# Patient Record
Sex: Male | Born: 1955 | Race: White | Hispanic: No | Marital: Married | Smoking: Never smoker
Health system: Southern US, Community
[De-identification: ages and names within clinical notes are randomized; demographics above are authoritative.]

## PROBLEM LIST (undated history)

## (undated) DIAGNOSIS — M1990 Unspecified osteoarthritis, unspecified site: Secondary | ICD-10-CM

## (undated) DIAGNOSIS — I10 Essential (primary) hypertension: Secondary | ICD-10-CM

## (undated) DIAGNOSIS — IMO0001 Reserved for inherently not codable concepts without codable children: Secondary | ICD-10-CM

## (undated) DIAGNOSIS — F329 Major depressive disorder, single episode, unspecified: Secondary | ICD-10-CM

## (undated) DIAGNOSIS — G250 Essential tremor: Secondary | ICD-10-CM

## (undated) DIAGNOSIS — I509 Heart failure, unspecified: Secondary | ICD-10-CM

## (undated) DIAGNOSIS — M545 Low back pain, unspecified: Secondary | ICD-10-CM

## (undated) DIAGNOSIS — I2510 Atherosclerotic heart disease of native coronary artery without angina pectoris: Secondary | ICD-10-CM

## (undated) DIAGNOSIS — I209 Angina pectoris, unspecified: Secondary | ICD-10-CM

## (undated) DIAGNOSIS — I25810 Atherosclerosis of coronary artery bypass graft(s) without angina pectoris: Secondary | ICD-10-CM

## (undated) DIAGNOSIS — G4733 Obstructive sleep apnea (adult) (pediatric): Secondary | ICD-10-CM

## (undated) DIAGNOSIS — G8929 Other chronic pain: Secondary | ICD-10-CM

## (undated) DIAGNOSIS — E785 Hyperlipidemia, unspecified: Secondary | ICD-10-CM

## (undated) DIAGNOSIS — E669 Obesity, unspecified: Secondary | ICD-10-CM

## (undated) DIAGNOSIS — Z9289 Personal history of other medical treatment: Secondary | ICD-10-CM

## (undated) DIAGNOSIS — Z9989 Dependence on other enabling machines and devices: Secondary | ICD-10-CM

## (undated) DIAGNOSIS — I219 Acute myocardial infarction, unspecified: Secondary | ICD-10-CM

## (undated) DIAGNOSIS — R001 Bradycardia, unspecified: Secondary | ICD-10-CM

## (undated) DIAGNOSIS — Z87442 Personal history of urinary calculi: Secondary | ICD-10-CM

## (undated) DIAGNOSIS — F32A Depression, unspecified: Secondary | ICD-10-CM

## (undated) DIAGNOSIS — N189 Chronic kidney disease, unspecified: Secondary | ICD-10-CM

## (undated) HISTORY — PX: BACK SURGERY: SHX140

## (undated) HISTORY — DX: Obesity, unspecified: E66.9

## (undated) HISTORY — PX: POSTERIOR CERVICAL FUSION/FORAMINOTOMY: SHX5038

## (undated) HISTORY — PX: CYSTOSCOPY W/ URETEROSCOPY W/ LITHOTRIPSY: SUR380

## (undated) HISTORY — PX: ANTERIOR CERVICAL DISCECTOMY: SHX1160

## (undated) HISTORY — PX: LUMBAR DISC SURGERY: SHX700

## (undated) HISTORY — PX: CARPAL TUNNEL RELEASE: SHX101

## (undated) HISTORY — PX: LITHOTRIPSY: SUR834

## (undated) HISTORY — PX: SHOULDER ARTHROSCOPY: SHX128

---

## 1998-03-05 ENCOUNTER — Ambulatory Visit (HOSPITAL_BASED_OUTPATIENT_CLINIC_OR_DEPARTMENT_OTHER): Admission: RE | Admit: 1998-03-05 | Discharge: 1998-03-05 | Payer: Self-pay | Admitting: Orthopedic Surgery

## 1999-09-22 ENCOUNTER — Encounter: Payer: Self-pay | Admitting: Orthopedic Surgery

## 1999-09-22 ENCOUNTER — Ambulatory Visit (HOSPITAL_COMMUNITY): Admission: RE | Admit: 1999-09-22 | Discharge: 1999-09-22 | Payer: Self-pay | Admitting: Orthopedic Surgery

## 1999-10-27 ENCOUNTER — Ambulatory Visit (HOSPITAL_COMMUNITY): Admission: RE | Admit: 1999-10-27 | Discharge: 1999-10-27 | Payer: Self-pay | Admitting: Neurosurgery

## 1999-10-27 ENCOUNTER — Encounter: Payer: Self-pay | Admitting: Neurosurgery

## 1999-11-10 ENCOUNTER — Encounter: Payer: Self-pay | Admitting: Neurosurgery

## 1999-11-10 ENCOUNTER — Ambulatory Visit (HOSPITAL_COMMUNITY): Admission: RE | Admit: 1999-11-10 | Discharge: 1999-11-10 | Payer: Self-pay | Admitting: Neurosurgery

## 1999-11-24 ENCOUNTER — Encounter: Payer: Self-pay | Admitting: Neurosurgery

## 1999-11-24 ENCOUNTER — Ambulatory Visit (HOSPITAL_COMMUNITY): Admission: RE | Admit: 1999-11-24 | Discharge: 1999-11-24 | Payer: Self-pay | Admitting: Neurosurgery

## 2000-02-18 ENCOUNTER — Encounter: Payer: Self-pay | Admitting: Neurosurgery

## 2000-02-23 ENCOUNTER — Inpatient Hospital Stay (HOSPITAL_COMMUNITY): Admission: RE | Admit: 2000-02-23 | Discharge: 2000-02-25 | Payer: Self-pay | Admitting: Neurosurgery

## 2000-02-23 ENCOUNTER — Encounter: Payer: Self-pay | Admitting: Neurosurgery

## 2000-03-17 ENCOUNTER — Ambulatory Visit (HOSPITAL_COMMUNITY): Admission: RE | Admit: 2000-03-17 | Discharge: 2000-03-17 | Payer: Self-pay | Admitting: Neurosurgery

## 2000-03-17 ENCOUNTER — Encounter: Payer: Self-pay | Admitting: Neurosurgery

## 2000-04-07 ENCOUNTER — Inpatient Hospital Stay (HOSPITAL_COMMUNITY): Admission: EM | Admit: 2000-04-07 | Discharge: 2000-04-11 | Payer: Self-pay | Admitting: *Deleted

## 2000-04-28 HISTORY — PX: NM MYOCAR SINGLE W/SPECT: HXRAD625

## 2000-05-12 ENCOUNTER — Encounter: Payer: Self-pay | Admitting: Emergency Medicine

## 2000-05-12 ENCOUNTER — Inpatient Hospital Stay (HOSPITAL_COMMUNITY): Admission: EM | Admit: 2000-05-12 | Discharge: 2000-05-13 | Payer: Self-pay | Admitting: Emergency Medicine

## 2000-08-16 ENCOUNTER — Inpatient Hospital Stay (HOSPITAL_COMMUNITY): Admission: EM | Admit: 2000-08-16 | Discharge: 2000-08-18 | Payer: Self-pay | Admitting: Emergency Medicine

## 2000-08-16 ENCOUNTER — Encounter: Payer: Self-pay | Admitting: Cardiovascular Disease

## 2000-08-18 ENCOUNTER — Encounter: Payer: Self-pay | Admitting: Urology

## 2000-08-25 ENCOUNTER — Ambulatory Visit (HOSPITAL_COMMUNITY): Admission: RE | Admit: 2000-08-25 | Discharge: 2000-08-25 | Payer: Self-pay | Admitting: Urology

## 2000-08-25 ENCOUNTER — Encounter: Payer: Self-pay | Admitting: Urology

## 2000-08-31 ENCOUNTER — Encounter: Payer: Self-pay | Admitting: Urology

## 2000-08-31 ENCOUNTER — Encounter: Admission: RE | Admit: 2000-08-31 | Discharge: 2000-08-31 | Payer: Self-pay | Admitting: Urology

## 2000-09-06 ENCOUNTER — Encounter: Payer: Self-pay | Admitting: Urology

## 2000-09-06 ENCOUNTER — Ambulatory Visit (HOSPITAL_COMMUNITY): Admission: RE | Admit: 2000-09-06 | Discharge: 2000-09-06 | Payer: Self-pay | Admitting: Urology

## 2000-09-15 ENCOUNTER — Encounter: Payer: Self-pay | Admitting: *Deleted

## 2000-09-15 ENCOUNTER — Ambulatory Visit (HOSPITAL_COMMUNITY): Admission: RE | Admit: 2000-09-15 | Discharge: 2000-09-15 | Payer: Self-pay | Admitting: Urology

## 2000-09-27 ENCOUNTER — Encounter: Payer: Self-pay | Admitting: Urology

## 2000-09-27 ENCOUNTER — Encounter: Admission: RE | Admit: 2000-09-27 | Discharge: 2000-09-27 | Payer: Self-pay | Admitting: Urology

## 2000-10-11 DIAGNOSIS — I2510 Atherosclerotic heart disease of native coronary artery without angina pectoris: Secondary | ICD-10-CM

## 2000-10-11 HISTORY — DX: Atherosclerotic heart disease of native coronary artery without angina pectoris: I25.10

## 2000-10-31 ENCOUNTER — Encounter: Admission: RE | Admit: 2000-10-31 | Discharge: 2000-10-31 | Payer: Self-pay | Admitting: Urology

## 2000-10-31 ENCOUNTER — Encounter: Payer: Self-pay | Admitting: Urology

## 2000-11-03 ENCOUNTER — Encounter: Payer: Self-pay | Admitting: Urology

## 2000-11-03 ENCOUNTER — Encounter: Admission: RE | Admit: 2000-11-03 | Discharge: 2000-11-03 | Payer: Self-pay | Admitting: Urology

## 2000-11-12 ENCOUNTER — Encounter: Payer: Self-pay | Admitting: Neurosurgery

## 2000-11-12 ENCOUNTER — Ambulatory Visit (HOSPITAL_COMMUNITY): Admission: RE | Admit: 2000-11-12 | Discharge: 2000-11-12 | Payer: Self-pay | Admitting: Neurosurgery

## 2000-11-24 ENCOUNTER — Ambulatory Visit (HOSPITAL_COMMUNITY): Admission: RE | Admit: 2000-11-24 | Discharge: 2000-11-24 | Payer: Self-pay | Admitting: Urology

## 2000-11-24 ENCOUNTER — Encounter: Payer: Self-pay | Admitting: Urology

## 2000-12-05 ENCOUNTER — Ambulatory Visit (HOSPITAL_COMMUNITY): Admission: RE | Admit: 2000-12-05 | Discharge: 2000-12-05 | Payer: Self-pay | Admitting: Neurosurgery

## 2000-12-05 ENCOUNTER — Encounter: Payer: Self-pay | Admitting: Neurosurgery

## 2000-12-07 ENCOUNTER — Encounter: Payer: Self-pay | Admitting: Urology

## 2000-12-07 ENCOUNTER — Encounter: Admission: RE | Admit: 2000-12-07 | Discharge: 2000-12-07 | Payer: Self-pay | Admitting: Urology

## 2000-12-19 ENCOUNTER — Ambulatory Visit (HOSPITAL_COMMUNITY): Admission: RE | Admit: 2000-12-19 | Discharge: 2000-12-19 | Payer: Self-pay | Admitting: Neurosurgery

## 2000-12-19 ENCOUNTER — Encounter: Payer: Self-pay | Admitting: Neurosurgery

## 2000-12-21 ENCOUNTER — Encounter: Admission: RE | Admit: 2000-12-21 | Discharge: 2000-12-21 | Payer: Self-pay | Admitting: Urology

## 2000-12-21 ENCOUNTER — Encounter: Payer: Self-pay | Admitting: Urology

## 2001-01-05 ENCOUNTER — Encounter: Payer: Self-pay | Admitting: Urology

## 2001-01-05 ENCOUNTER — Encounter: Admission: RE | Admit: 2001-01-05 | Discharge: 2001-01-05 | Payer: Self-pay | Admitting: Urology

## 2001-01-24 ENCOUNTER — Encounter: Admission: RE | Admit: 2001-01-24 | Discharge: 2001-01-24 | Payer: Self-pay | Admitting: Neurosurgery

## 2001-01-24 ENCOUNTER — Encounter: Payer: Self-pay | Admitting: Neurosurgery

## 2001-04-10 DIAGNOSIS — Z9289 Personal history of other medical treatment: Secondary | ICD-10-CM

## 2001-04-10 HISTORY — DX: Personal history of other medical treatment: Z92.89

## 2001-04-18 ENCOUNTER — Inpatient Hospital Stay (HOSPITAL_COMMUNITY): Admission: RE | Admit: 2001-04-18 | Discharge: 2001-04-27 | Payer: Self-pay | Admitting: *Deleted

## 2001-04-19 ENCOUNTER — Encounter: Payer: Self-pay | Admitting: Cardiothoracic Surgery

## 2001-04-20 ENCOUNTER — Encounter: Payer: Self-pay | Admitting: Cardiothoracic Surgery

## 2001-04-20 HISTORY — PX: CORONARY ARTERY BYPASS GRAFT: SHX141

## 2001-04-21 ENCOUNTER — Encounter: Payer: Self-pay | Admitting: Cardiothoracic Surgery

## 2001-04-22 ENCOUNTER — Encounter: Payer: Self-pay | Admitting: Cardiothoracic Surgery

## 2001-05-26 ENCOUNTER — Encounter: Admission: RE | Admit: 2001-05-26 | Discharge: 2001-05-26 | Payer: Self-pay | Admitting: Cardiothoracic Surgery

## 2001-05-26 ENCOUNTER — Encounter: Payer: Self-pay | Admitting: Cardiothoracic Surgery

## 2001-09-10 HISTORY — PX: INSERT / REPLACE / REMOVE PACEMAKER: SUR710

## 2001-10-11 DIAGNOSIS — Z950 Presence of cardiac pacemaker: Secondary | ICD-10-CM

## 2001-10-11 HISTORY — DX: Presence of cardiac pacemaker: Z95.0

## 2002-05-10 HISTORY — PX: DOPPLER ECHOCARDIOGRAPHY: SHX263

## 2002-12-10 ENCOUNTER — Encounter: Admission: RE | Admit: 2002-12-10 | Discharge: 2002-12-10 | Payer: Self-pay | Admitting: Neurosurgery

## 2002-12-10 ENCOUNTER — Encounter: Payer: Self-pay | Admitting: Neurosurgery

## 2002-12-24 ENCOUNTER — Encounter: Payer: Self-pay | Admitting: Neurosurgery

## 2002-12-24 ENCOUNTER — Encounter: Admission: RE | Admit: 2002-12-24 | Discharge: 2002-12-24 | Payer: Self-pay | Admitting: Neurosurgery

## 2003-01-07 ENCOUNTER — Encounter: Payer: Self-pay | Admitting: Neurosurgery

## 2003-01-07 ENCOUNTER — Encounter: Admission: RE | Admit: 2003-01-07 | Discharge: 2003-01-07 | Payer: Self-pay | Admitting: Neurosurgery

## 2003-03-22 ENCOUNTER — Encounter: Payer: Self-pay | Admitting: Neurosurgery

## 2003-03-26 ENCOUNTER — Inpatient Hospital Stay (HOSPITAL_COMMUNITY): Admission: RE | Admit: 2003-03-26 | Discharge: 2003-03-28 | Payer: Self-pay | Admitting: Neurosurgery

## 2003-03-26 ENCOUNTER — Encounter: Payer: Self-pay | Admitting: Neurosurgery

## 2003-07-09 ENCOUNTER — Encounter: Payer: Self-pay | Admitting: Neurosurgery

## 2003-07-09 ENCOUNTER — Ambulatory Visit (HOSPITAL_COMMUNITY): Admission: RE | Admit: 2003-07-09 | Discharge: 2003-07-09 | Payer: Self-pay | Admitting: Neurosurgery

## 2003-10-29 ENCOUNTER — Inpatient Hospital Stay (HOSPITAL_COMMUNITY): Admission: RE | Admit: 2003-10-29 | Discharge: 2003-10-31 | Payer: Self-pay | Admitting: Neurosurgery

## 2004-02-14 ENCOUNTER — Ambulatory Visit (HOSPITAL_COMMUNITY): Admission: RE | Admit: 2004-02-14 | Discharge: 2004-02-14 | Payer: Self-pay | Admitting: Neurosurgery

## 2004-10-11 DIAGNOSIS — R001 Bradycardia, unspecified: Secondary | ICD-10-CM

## 2004-10-11 HISTORY — DX: Bradycardia, unspecified: R00.1

## 2004-12-09 HISTORY — PX: CORONARY ANGIOPLASTY WITH STENT PLACEMENT: SHX49

## 2004-12-10 ENCOUNTER — Observation Stay (HOSPITAL_COMMUNITY): Admission: EM | Admit: 2004-12-10 | Discharge: 2004-12-11 | Payer: Self-pay | Admitting: Emergency Medicine

## 2005-09-13 ENCOUNTER — Inpatient Hospital Stay (HOSPITAL_COMMUNITY): Admission: AD | Admit: 2005-09-13 | Discharge: 2005-09-16 | Payer: Self-pay | Admitting: *Deleted

## 2005-09-13 ENCOUNTER — Ambulatory Visit: Payer: Self-pay | Admitting: Internal Medicine

## 2005-09-27 ENCOUNTER — Emergency Department (HOSPITAL_COMMUNITY): Admission: EM | Admit: 2005-09-27 | Discharge: 2005-09-27 | Payer: Self-pay | Admitting: Emergency Medicine

## 2005-10-11 HISTORY — PX: MAXIMUM ACCESS (MAS)POSTERIOR LUMBAR INTERBODY FUSION (PLIF) 3 LEVEL: SHX6370

## 2005-10-15 ENCOUNTER — Ambulatory Visit: Payer: Self-pay | Admitting: Internal Medicine

## 2006-03-02 ENCOUNTER — Ambulatory Visit (HOSPITAL_COMMUNITY): Admission: RE | Admit: 2006-03-02 | Discharge: 2006-03-02 | Payer: Self-pay | Admitting: Gastroenterology

## 2006-10-11 HISTORY — PX: CERVICAL DISC SURGERY: SHX588

## 2007-05-18 ENCOUNTER — Encounter: Admission: RE | Admit: 2007-05-18 | Discharge: 2007-05-18 | Payer: Self-pay | Admitting: *Deleted

## 2007-05-24 ENCOUNTER — Ambulatory Visit (HOSPITAL_COMMUNITY): Admission: RE | Admit: 2007-05-24 | Discharge: 2007-05-24 | Payer: Self-pay | Admitting: *Deleted

## 2007-05-24 HISTORY — PX: CARDIAC CATHETERIZATION: SHX172

## 2007-07-21 ENCOUNTER — Encounter: Admission: RE | Admit: 2007-07-21 | Discharge: 2007-07-21 | Payer: Self-pay | Admitting: Neurosurgery

## 2007-10-13 ENCOUNTER — Encounter: Admission: RE | Admit: 2007-10-13 | Discharge: 2007-10-13 | Payer: Self-pay | Admitting: Neurosurgery

## 2007-11-17 ENCOUNTER — Inpatient Hospital Stay (HOSPITAL_COMMUNITY): Admission: RE | Admit: 2007-11-17 | Discharge: 2007-11-22 | Payer: Self-pay | Admitting: Neurosurgery

## 2008-09-17 ENCOUNTER — Emergency Department (HOSPITAL_COMMUNITY): Admission: EM | Admit: 2008-09-17 | Discharge: 2008-09-17 | Payer: Self-pay | Admitting: Emergency Medicine

## 2008-11-21 ENCOUNTER — Encounter: Admission: RE | Admit: 2008-11-21 | Discharge: 2008-11-21 | Payer: Self-pay | Admitting: Neurosurgery

## 2009-01-31 ENCOUNTER — Inpatient Hospital Stay (HOSPITAL_COMMUNITY): Admission: RE | Admit: 2009-01-31 | Discharge: 2009-02-01 | Payer: Self-pay | Admitting: Neurosurgery

## 2009-02-08 HISTORY — PX: CARDIAC CATHETERIZATION: SHX172

## 2009-03-04 ENCOUNTER — Inpatient Hospital Stay (HOSPITAL_COMMUNITY): Admission: EM | Admit: 2009-03-04 | Discharge: 2009-03-07 | Payer: Self-pay | Admitting: Emergency Medicine

## 2010-05-25 HISTORY — PX: DOPPLER ECHOCARDIOGRAPHY: SHX263

## 2010-08-20 ENCOUNTER — Encounter: Admission: RE | Admit: 2010-08-20 | Discharge: 2010-08-20 | Payer: Self-pay | Admitting: Neurosurgery

## 2010-08-31 ENCOUNTER — Encounter: Payer: Self-pay | Admitting: Family Medicine

## 2010-09-01 ENCOUNTER — Ambulatory Visit (HOSPITAL_COMMUNITY)
Admission: RE | Admit: 2010-09-01 | Discharge: 2010-09-03 | Disposition: A | Discharge status: Still In Hospital | Payer: Self-pay | Source: Home / Self Care | Admitting: Cardiology

## 2010-09-01 HISTORY — PX: CORONARY ANGIOPLASTY WITH STENT PLACEMENT: SHX49

## 2010-11-12 NOTE — Letter (Signed)
Summary: [facility]  [facility]   Imported By: Laural Benes 09/24/2010 14:17:22  _____________________________________________________________________  External Attachment:    Type:   Image     Comment:   External Document

## 2010-12-23 LAB — BASIC METABOLIC PANEL
BUN: 10 mg/dL (ref 6–23)
BUN: 10 mg/dL (ref 6–23)
BUN: 10 mg/dL (ref 6–23)
CO2: 28 mEq/L (ref 19–32)
Calcium: 8.6 mg/dL (ref 8.4–10.5)
Calcium: 9 mg/dL (ref 8.4–10.5)
Chloride: 102 mEq/L (ref 96–112)
Chloride: 104 mEq/L (ref 96–112)
Creatinine, Ser: 0.94 mg/dL (ref 0.4–1.5)
Creatinine, Ser: 1.08 mg/dL (ref 0.4–1.5)
Creatinine, Ser: 1.13 mg/dL (ref 0.4–1.5)
GFR calc Af Amer: >60 mL/min (ref >60)
GFR calc Af Amer: >60 mL/min (ref >60)
GFR calc non Af Amer: >60 mL/min (ref >60)
Glucose, Bld: 107 mg/dL — ABNORMAL HIGH (ref 70–99)
Glucose, Bld: 112 mg/dL — ABNORMAL HIGH (ref 70–99)

## 2010-12-23 LAB — CBC
HCT: 40.9 % (ref 39.0–52.0)
MCH: 28.1 pg (ref 26.0–34.0)
MCHC: 33.7 g/dL (ref 30.0–36.0)
MCHC: 34.2 g/dL (ref 30.0–36.0)
MCHC: 35.1 g/dL (ref 30.0–36.0)
MCV: 81.8 fL (ref 78.0–100.0)
MCV: 83.4 fL (ref 78.0–100.0)
Platelets: 228 10*3/uL (ref 150–400)
Platelets: 270 10*3/uL (ref 150–400)
Platelets: 271 10*3/uL (ref 150–400)
RBC: 4.52 MIL/uL (ref 4.22–5.81)
RDW: 13.5 % (ref 11.5–15.5)
RDW: 13.7 % (ref 11.5–15.5)
RDW: 13.8 % (ref 11.5–15.5)
WBC: 6.6 10*3/uL (ref 4.0–10.5)
WBC: 8 10*3/uL (ref 4.0–10.5)

## 2011-01-19 LAB — BASIC METABOLIC PANEL
BUN: 12 mg/dL (ref 6–23)
Calcium: 8.9 mg/dL (ref 8.4–10.5)
Calcium: 9.2 mg/dL (ref 8.4–10.5)
Creatinine, Ser: 1.01 mg/dL (ref 0.4–1.5)
GFR calc Af Amer: >60 mL/min (ref >60)
GFR calc non Af Amer: >60 mL/min (ref >60)
Glucose, Bld: 108 mg/dL — ABNORMAL HIGH (ref 70–99)
Sodium: 139 mEq/L (ref 135–145)

## 2011-01-19 LAB — CBC
HCT: 40.2 % (ref 39.0–52.0)
HCT: 44.6 % (ref 39.0–52.0)
Hemoglobin: 15.3 g/dL (ref 13.0–17.0)
MCHC: 34.4 g/dL (ref 30.0–36.0)
Platelets: 233 10*3/uL (ref 150–400)
Platelets: 258 10*3/uL (ref 150–400)
RBC: 5.1 MIL/uL (ref 4.22–5.81)
RDW: 14.4 % (ref 11.5–15.5)
RDW: 15.1 % (ref 11.5–15.5)
WBC: 8.7 10*3/uL (ref 4.0–10.5)

## 2011-01-19 LAB — COMPREHENSIVE METABOLIC PANEL
Alkaline Phosphatase: 78 U/L (ref 39–117)
BUN: 10 mg/dL (ref 6–23)
Chloride: 105 mEq/L (ref 96–112)
Creatinine, Ser: 1.02 mg/dL (ref 0.4–1.5)
GFR calc non Af Amer: >60 mL/min (ref >60)
Glucose, Bld: 106 mg/dL — ABNORMAL HIGH (ref 70–99)
Potassium: 3.9 mEq/L (ref 3.5–5.1)
Total Bilirubin: 1.5 mg/dL — ABNORMAL HIGH (ref 0.3–1.2)

## 2011-01-19 LAB — POCT I-STAT, CHEM 8
Calcium, Ion: 1.11 mmol/L — ABNORMAL LOW (ref 1.12–1.32)
Chloride: 104 mEq/L (ref 96–112)
Glucose, Bld: 136 mg/dL — ABNORMAL HIGH (ref 70–99)
HCT: 45 % (ref 39.0–52.0)
Hemoglobin: 15.3 g/dL (ref 13.0–17.0)
Potassium: 3.7 mEq/L (ref 3.5–5.1)

## 2011-01-19 LAB — LIPID PANEL
HDL: 24 mg/dL — ABNORMAL LOW (ref >39)
Triglycerides: 388 mg/dL — ABNORMAL HIGH (ref <150)
VLDL: 78 mg/dL — ABNORMAL HIGH (ref 0–40)

## 2011-01-19 LAB — DIFFERENTIAL
Basophils Absolute: 0 10*3/uL (ref 0.0–0.1)
Basophils Relative: 0 % (ref 0–1)
Lymphocytes Relative: 22 % (ref 12–46)
Neutro Abs: 4.8 10*3/uL (ref 1.7–7.7)
Neutrophils Relative %: 61 % (ref 43–77)

## 2011-01-19 LAB — PLATELET COUNT: Platelets: 243 10*3/uL (ref 150–400)

## 2011-01-19 LAB — HEMOGLOBIN A1C
Hgb A1c MFr Bld: 5.7 % (ref 4.6–6.1)
Mean Plasma Glucose: 117 mg/dL

## 2011-01-19 LAB — CARDIAC PANEL(CRET KIN+CKTOT+MB+TROPI)
CK, MB: 11.9 ng/mL — ABNORMAL HIGH (ref 0.3–4.0)
CK, MB: 8.3 ng/mL — ABNORMAL HIGH (ref 0.3–4.0)
Relative Index: 4.1 — ABNORMAL HIGH (ref 0.0–2.5)
Total CK: 292 U/L — ABNORMAL HIGH (ref 7–232)

## 2011-01-19 LAB — POCT CARDIAC MARKERS
CKMB, poc: 10.1 ng/mL (ref 1.0–8.0)
Troponin i, poc: 1.41 ng/mL (ref 0.00–0.09)

## 2011-01-19 LAB — TROPONIN I: Troponin I: 1.96 ng/mL (ref 0.00–0.06)

## 2011-01-19 LAB — MAGNESIUM: Magnesium: 2.4 mg/dL (ref 1.5–2.5)

## 2011-01-19 LAB — HEPARIN LEVEL (UNFRACTIONATED): Heparin Unfractionated: 0.29 IU/mL — ABNORMAL LOW (ref 0.30–0.70)

## 2011-01-20 LAB — URINALYSIS, ROUTINE W REFLEX MICROSCOPIC
Glucose, UA: NEGATIVE mg/dL
Hgb urine dipstick: NEGATIVE
Protein, ur: NEGATIVE mg/dL
pH: 5.5 (ref 5.0–8.0)

## 2011-01-20 LAB — CBC
HCT: 47.6 % (ref 39.0–52.0)
Hemoglobin: 15.5 g/dL (ref 13.0–17.0)
MCV: 82 fL (ref 78.0–100.0)
RBC: 5.8 MIL/uL (ref 4.22–5.81)
WBC: 5.7 10*3/uL (ref 4.0–10.5)

## 2011-01-20 LAB — COMPREHENSIVE METABOLIC PANEL
AST: 24 U/L (ref 0–37)
BUN: 10 mg/dL (ref 6–23)
CO2: 25 mEq/L (ref 19–32)
Chloride: 104 mEq/L (ref 96–112)
Creatinine, Ser: 1.01 mg/dL (ref 0.4–1.5)
GFR calc non Af Amer: >60 mL/min (ref >60)
Total Bilirubin: 2 mg/dL — ABNORMAL HIGH (ref 0.3–1.2)

## 2011-01-20 LAB — DIFFERENTIAL
Basophils Absolute: 0 10*3/uL (ref 0.0–0.1)
Eosinophils Absolute: 0.4 10*3/uL (ref 0.0–0.7)
Eosinophils Relative: 7 % — ABNORMAL HIGH (ref 0–5)
Lymphs Abs: 1.4 10*3/uL (ref 0.7–4.0)
Monocytes Absolute: 0.5 10*3/uL (ref 0.1–1.0)

## 2011-01-20 LAB — BILIRUBIN, DIRECT: Bilirubin, Direct: 0.2 mg/dL (ref 0.0–0.3)

## 2011-01-20 LAB — PROTIME-INR: Prothrombin Time: 13 seconds (ref 11.6–15.2)
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
## 2011-02-23 NOTE — Op Note (Signed)
NAME:  [name], [name]            ACCOUNT NO.:  [id number]   MEDICAL RECORD NO.:  [id number]          PATIENT TYPE:  INP   LOCATION:  3310                         FACILITY:  [facility]   PHYSICIAN:  Elizabeth Sauer, M.D.      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   PROCEDURE:  L3-4, L4-5, L5-S1 laminectomy, diskectomy, posterior lumbar  antibody fusion with PEEK cages, segmental pedicle screw fixation from  L3 to S1 and posterolateral arthrodesis L3 to S1.   SURGEON:  Elizabeth Sauer, M.D.   ANESTHESIA:  General endotracheal.   PREPARATION:  Betadine  prep with alcohol wipe.   COMPLICATIONS:  None.   NURSE ASSISTANT:  C[place].   DOCTOR ASSISTANT:  Dr. Vertell Limber.   BODY OF TEXT:  This is a 55 year old gentleman with severe spondylosis,  difficulty with walking.  He has had several prior diskectomies at 3-4,  4-5 and 5-1.  He was taken to the operating room and smoothly  anesthetized and intubated, placed prone on the operating table.  His  frozen shoulder on the right was padded and protected.  Following shave,  prep and drape in the usual sterile fashion the skin was infiltrated  with 1% lidocaine with 1:400,000 epinephrine.  Skin was incised from mid  L2 to mid S1 and the lamina and transverse processes of L3, L4, L5, S1  and the sacral ala were exposed bilaterally in subperiosteal plane.  Intraoperative x-ray confirmed correctness of level.  Having confirmed  correctness of level the pars reticularis lamina and inferior facet of  L3, L4 and L5 and superior facet of L4, L5 and S1 removed bilaterally  using a high speed drill and the Kerrison.  This allowed exposure of  ligamentum flavum that was removed and exposure of the L3, L4, L5 and S1  nerve roots as they traversed this area.   Complete decompression of the  nerve roots was obtained.  4-5 was the most degenerated level with  significant both left and right-sided compression of the 4 root as it  exited, 5 root was also significantly compromised.  Following complete  decompression the PEEK cages were placed 7 mm at L3-4 and L5-S1 and 8 mm  at L4-5.  Pedicle screws were then placed using the standard landmarks  at L3, L4, L5 and S1.  Intraoperative x-rays showed good placement of  the pedicle screws.  The rods were contoured and placed in the screws  and locking caps placed.  Final x-ray showed good placement of cages and  pedicle screws and rods.  The transverse processes and  sacral ala were decorticated and packed with BMP on the extender matrix.  Successive layers of 0 Vicryl, 2-0 Vicryl and 3-0 nylon were used to  close.  Betadine and Telfa dressing was applied and occlusive OpSite.  The patient returned to the recovery room in good condition.  ______________________________  Elizabeth Sauer, M.D.     MWR/MEDQ  D:  11/17/2007  T:  11/18/2007  Job:  [id number]

## 2011-02-23 NOTE — Discharge Summary (Signed)
NAME:  [name], [name]            ACCOUNT NO.:  [id number]   MEDICAL RECORD NO.:  [id number]          PATIENT TYPE:  INP   LOCATION:  3031                         FACILITY:  [facility]   PHYSICIAN:  Elizabeth Sauer, M.D.      DATE OF BIRTH:  Sep 27, 1956   DATE OF ADMISSION:  11/17/2007  DATE OF DISCHARGE:  11/22/2007                               DISCHARGE SUMMARY   ADMISSION DIAGNOSIS:  Spondylosis, L3-4, L4-5, L5-S1.   DISCHARGE DIAGNOSIS:  Spondylosis, L3-4, L4-5, L5-S1.   OPERATIVE PROCEDURE:  L3-4, L4-5, L5-S1 laminectomy, diskectomy,  posterior lumbar antibody fusion, segmental pedicle screw fixation and  posterolateral arthrodesis.   COMPLICATIONS:  None.   DISCHARGE/PLAN:  Alive and well.   BODY OF TEXT:  This is a 55 year old gentleman whose history and  physical is recounted on the chart.  He has had spondylosis and, in  fact, had a decompression at L3-4 numerous years ago.  Diskography was  done and was positive at all three levels.  He is admitted for fusion.   MEDICAL HISTORY:  Remarkable for coronary artery disease and bypass and  implanted defibrillator.   MEDICATIONS:  Vicodin, Flexeril, Celebrex and metoprolol.   ALLERGIES:  None.   SURGICAL HISTORY:  Is as above.  He has also had an anterior cervical  decompression and fusion.   SOCIAL HISTORY:  He no longer smokes, occasionally drinks beer, and is  on disability.   His general exam was intact.  Neurologic exam was intact.   CLINICAL IMPRESSION:  Severe lumbar spondylosis with disabling back  pain.   He was admitted after ascertaining normal laboratory values and  underwent a L3-4, L4-5 and L5-S1 fusion.  Postoperatively he was in the  ACU for couple of days.  He was somewhat tachycardic.  Metoprolol was  started by the cardiologist and he was doing well with this.  On exam,  his strength has remained full.  He has incisional back pain but the  aching-type back pain that he was experiencing before he is  gone.  Currently his strength is full.  His  incision is dry and well-healing.  Some drainage has stopped.  He had a  bit of confusion in the hospital, which has resolved.  He is being  discharged home to the care of his family.  His follow-up will be in the  [facility] offices in about a week for suture removal.           ______________________________  Elizabeth Sauer, M.D.     MWR/MEDQ  D:  11/22/2007  T:  11/23/2007  Job:  [id number]
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
## 2011-02-23 NOTE — H&P (Signed)
NAME:  [name], [name]            ACCOUNT NO.:  [id number]   MEDICAL RECORD NO.:  [id number]          PATIENT TYPE:  INP   LOCATION:  2550                         FACILITY:  [facility]   PHYSICIAN:  Elizabeth Sauer, M.D.      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   HISTORY OF PRESENT ILLNESS:  This is a 55 year old right-handed white  gentleman that I operated on in 1997, I think, for a 3-4 disk on the  right side.  He has been having increasing pain in his back.  He has  significant degenerative disk disease at 3-4, 4-5 and 5-1.  He has had  positive diskography and he is now admitted for fusion at those levels.   MEDICAL HISTORY:  1. Bad coronary artery disease.  2. Bypass.  3. Defibrillator implanted.   MEDICATIONS:  Vicodin, Flexeril, Celebrex and several heart medications  listed on the chart.   ALLERGIES:  None.   SURGICAL HISTORY:  As above.  He is also had an anterior decompression  and fusion.   SOCIAL HISTORY:  No longer smokes, occasionally drinks beer and is on  disability.   REVIEW OF SYSTEMS:  Marked for back and leg pain, cardiac disease.  HEENT exam normal limits.  He has reasonable range of motion of neck.  CHEST:  Clear.  CARDIAC:  Exam is regular rate and rhythm with this  time.  ABDOMEN:  Large but nontender with no hepatosplenomegaly.  EXTREMITIES:  Without clubbing, cyanosis.  GU: Exam is deferred,  peripheral pulses are good.  NEUROLOGICAL:  He is awake, alert and  oriented, cranial nerves are intact.  Motor exam shows 5/5 strength  throughout the upper and lower extremities save for mild dorsiflexion  weakness on the left side.  Reflexes are two at the knees, absent at the  ankles.  Straight leg raise is very positive.   Diskography results have been strong and concordant in 3-4, 4-5 and 5-1.   CLINICAL  IMPRESSION:  Severe lumbar spondylosis with disabling back  pain.   PLAN:  Lumbar fusion at 3-4, 4-5. 5-1.  The risks and benefits were  discussed with he wishes proceed.    .           ______________________________  Elizabeth Sauer, M.D.     MWR/MEDQ  D:  11/17/2007  T:  11/18/2007  Job:  [id number]

## 2011-02-23 NOTE — Op Note (Signed)
NAMEARYEL, [name]            ACCOUNT NO.:  [id number]   MEDICAL RECORD NO.:  [id number]          PATIENT TYPE:  INP   LOCATION:  2915                         FACILITY:  [facility]   PHYSICIAN:  Eden Lathe. Einar Gip, MD       DATE OF BIRTH:  1956/04/21   DATE OF PROCEDURE:  DATE OF DISCHARGE:  03/07/2009                               OPERATIVE REPORT   DISCHARGE DIAGNOSES:  1. Non-ST elevation myocardial infarction, unsuccessful attempt at      obtuse marginal 1, percutaneous coronary intervention and      intracoronary vascular ultrasound of the obtuse marginal 2      saphenous vein graft, plan is for medical therapy.  2. Preserved left ventricular function ejection fraction of 50%.  3. Coronary artery bypass grafting in 2002.  4. Multiple percutaneous coronary interventions since with recent      stenting of the ramus March 2006 with a Taxus stent.  5. Dual-chamber pacemaker implant by Dr. Lovena Le on December 2006      secondary to bradycardia.  6. Treated dyslipidemia.  7. Treated hypertension.  8. Anxiety and depression.   HOSPITAL COURSE:  The patient is a 55 year old male followed by Dr.  Elisabeth Cara with history of coronary artery disease as described above.  He  presented on Mar 04, 2009 with chest pain and nausea, worrisome for  unstable angina.  He was admitted to telemetry, started on heparin and  nitroglycerin.  Enzymes were positive with a troponin of 2.08 and a CK  of 374 with 20 MBs.  Catheterization done by Dr. Gwenlyn Found on Mar 05, 2009.  This revealed a 40% narrowing in the vein graft to the RCA, a patent  LIMA to the LAD, patent SVG to the diagonal 60-70% narrowing in the SVG  to the OM-2.  The OM-1 had a stent that was occluded.  The circumflex  had a patent stent.  Dr. Gwenlyn Found tried to open the OM-1 but was unable to  cross the lesion.  The vein graft to the OM-2 was IVUS'ed.  Plan is for  medical therapy.  The patient was transferred to the CCU and ambulated  and is stable.  We  feel he can be discharged on Mar 07, 2009.   LABORATORY DATA AT DISCHARGE:  Sodium 138, potassium 3.9, BUN 10,  creatinine 1.0, troponin peaked at 2.08, CKs at 374 with 20 MBs.  Cholesterol is 194, HDL 24, LDL 92.  White count 8.7, hemoglobin 14.3,  hematocrit 42, platelets 226.  TSH is 0.77.   STUDIES:  X-ray shows cardiomegaly and low lung volumes.  EKG shows  sinus rhythm, normal LV function.   DISCHARGE MEDICATIONS:  1. Lopressor 25 mg twice a day.  2. Caduet 10/20 daily.  3. Trazodone 100 mg a day.  4. Klonopin 1 mg daily.  5. Celebrex 200 mg twice a day.  6. Wellbutrin 150 mg a day.  7. Aspirin 81 mg 2 tablets a day.  8. Vicodin 1-2 q. 6 p.r.n.  9. Lasix p.r.n. for edema.  10.Plavix 75 mg a day.  11.Nitroglycerin sublingual p.r.n.   DISPOSITION:  The patient discharged in stable condition and will follow  up with Dr. Elisabeth Cara as an outpatient.      Erlene Quan, P.A.      Eden Lathe. Einar Gip, MD  Electronically Signed    LKK/MEDQ  D:  03/07/2009  T:  03/07/2009  Job:  [id number]   cc:   Tery Sanfilippo, MD  Verdell Carmine, M.D.

## 2011-02-23 NOTE — Cardiovascular Report (Signed)
NAMEMarland Kitchen  BURDETT, TIN            ACCOUNT NO.:  [id number]   MEDICAL RECORD NO.:  [id number]          PATIENT TYPE:  OIB   LOCATION:  2857                         FACILITY:  [facility]   PHYSICIAN:  Octavia Heir, MD  DATE OF BIRTH:  [birth year]   DATE OF PROCEDURE:  05/24/2007  DATE OF DISCHARGE:  05/24/2007                            CARDIAC CATHETERIZATION   PROCEDURE:  1. Left heart catheterization.  2. Coronary angiography.  3. Left ventriculogram.  4. Saphenous graft angiography.  5. Left internal mammary angiography.   COMPLICATIONS:  None.   INDICATIONS:  Mr. Muffler is a 55 year old male patient of the Dr.  Ward Givens and Dr. Alla German with a history of known CAD status  bypass surgery in 2002 consisting of LIMA to LAD, vein graft to ramus,  vein graft to OM, vein graft to PDA.  He did subsequently develop  disease in the ramus and underwent PCI ramus through the vein graft in  2006.  He also developed a significant bradycardia and underwent dual-  chamber pacer implant by Dr. Lovena Le in 2006.  He recently has complained  of increasing chest pain.  He is now brought for repeat catheterization  to reassess his graft status.   DESCRIPTION OF OPERATION:  After giving informed consent the patient was  brought to the cardiac cath lab.  The right groin was shaved, prepped  and draped in the usual sterile fashion.  Usual monitors were  established.  Using modified Seldinger technique a #6 French arterial  sheath was inserted in the right femoral artery.  6 French diagnostic  catheter was used to perform diagnostic angiography.   Left main is a medium-size vessel was 60% ostial/proximal lesion.   The LAD is a medium-size vessel which fills competitively in distal  portion.  There is a patent LIMA which inserts in the distal portion of  the LAD.  There is no disease in the IMA or distal IMA insertion.   The left coronary ostia reveals a large ramus intermedius which  bifurcates distally.  There is a patent saphenous vein graft which  inserts into the ramus.  There is no significant disease in the graft or  distal graft insertion.   The circumflex gives rise to four obtuse marginal branches.  The first  two obtuse marginals are small vessels which are coarsely irregular.  The third OM is a small to medium size vessel which bifurcates distally  with no significant disease.   The 4th OM is a medium-size size vessel which fills competitively.  There is a patent saphenous vein graft with insertion in the midportion  of the OM.  There is no disease in the body graft or distal graft  surgery.   The right coronary is a medium-size vessels which is totally occluded  its mid segment.  This a patent saphenous vein graft which inserts in  the PDA.  This does retrograde fill the PLA.  There is no significant  disease in the body of the graft or distal graft insertion.   Left ventriculogram reveals a preserved EF of 60%.   HEMODYNAMIC RESULTS:  Systemic arterial pressure 140/94, LV system  pressure 142/3, LVEDP of 9.   CONSULTATIONS:  1. Patent LIMA to LAD.  2. Patent vein graft to the ramus intermedius.  3. Patent vein graft to the obtuse marginal.  4. Patent vein graft to the PDA.  5. Significant left main and one-vessel CAD.  6. Normal LV systolic function.  7. Systemic hypertension.      Octavia Heir, MD  Electronically Signed     RHM/MEDQ  D:  05/24/2007  T:  05/25/2007  Job:  [id number]   cc:   Verdell Carmine, M.D.

## 2011-02-23 NOTE — Cardiovascular Report (Signed)
NAME:  [name], [name] NO.:  [id number]   MEDICAL RECORD NO.:  [id number]          PATIENT TYPE:  INP   LOCATION:  2915                         FACILITY:  [facility]   PHYSICIAN:  Quay Burow, M.D.   DATE OF BIRTH:  29-Oct-1955   DATE OF PROCEDURE:  DATE OF DISCHARGE:                            CARDIAC CATHETERIZATION   [name] is a 55 year old gentleman who has known CAD status post  bypass grafting, who underwent __________ LIMA to his LAD, vein graft to  ramus branch, OM, and PLA.  He has had PCIs distantly.  He also has a  history of permanent transvenous pacing secondary to a bradycardia and  sleep apnea.  He was admitted on Mar 04, 2009, with unstable angina.  He  ruled in for non-ST-segment elevation myocardial infarction and was  placed on heparin and Integrilin.  He presents now for diagnostic  coronary arteriography to define his anatomy and rule out ischemic  etiology.   PROCEDURE DESCRIPTION:  The patient was brought to the Second Floor  [facility] in the postabsorptive state.  He was  premedicated with p.o. Valium, IV versed and fentanyl.  His right groin  was prepped and shaved in the usual sterile fashion.  A 1% Xylocaine was  used local anesthesia.  A 6-French sheath was inserted into the right  femoral artery using standard Seldinger technique.  A 6-French right and  left Judkins diagnostic catheters, as well as [place] pigtail catheter,  and right bypass graft catheter were used for selective coronary  angiography, left ventriculography, selective vein graft angiography,  and selective LIMA angiography respectively.  Visipaque dye was used for  the entirety of the case.  Retrograde aortic, left ventricular, and  pullback pressures were recorded.   HEMODYNAMICS:  1. Aortic systolic pressure [id number], diastolic pressure 91.  2. Left ventricular systolic pressure 0000000 and diastolic pressure 20.   Selective coronary angiography:  1.  Left main; left main had 40% ostial stenosis.  2. LAD; LAD had diffuse 80-90% stenosis in its proximal third with      competitive flow distally.  There was a large diagonal branch that      arose high in the LAD with competitive flow and filling of the      graft with native injection.  3. Left circumflex; patent AV groove circumflex with a 95% stenosis      proximal to that stent and just distal to first OM.  The OM stent      was fluoroscopically visualized, but the vessel was occluded.  The      distal circumflex had 60% segmental stenosis followed by 90%      stenosis with retrograde fill of the vein graft.  4. Right coronary artery occluded in midportion.  5. Vein graft to the PDA widely patent with at most 30-40% proximal      stenosis of the aortic ostium.  6. LIMA to LAD widely patent.  7. Vein graft to the diagonal branch widely patent with 60% stenosis      in the diagonal branch beyond vein graft insertion.  8. Vein graft to the second OM patent with a smooth apple-core 60-70%      lesion proximally.   Left ventriculography; RAO and LAO left ventriculography were performed  using 20 mL of Visipaque dye at 10 mL per second in each view.  In the  RAO view, the EF was normal.  In the LAO view, there was moderate  posterolateral hypokinesia with an EF of 50%.   IMPRESSION:  [name] has an occluded first obtuse marginal at the  site of prior stenting and moderate disease in the vein graft to the  second obtuse marginal, just beyond the aortic anastomosis.  We will  proceed with attempt at percutaneous revascularization of obtuse  marginal 1 and intracoronary vascular ultrasound-guided intervention on  vein graft to  obtuse marginal 2.   The patient received 3500 units of heparin intravenously with an  activated clotting time of 250.  He already was on Integrilin infusion.  He had received aspirin, received 600 mg of p.o. Plavix, and Pepcid  intravenous (20 mg).   Visipaque dye was used through the entirety of the  case.  Retrograde aortic pressure was monitored during the case.   Using a 7-French XB 3.5 guide catheter along with an __________ Sonny Masters  soft wire, attempts were made to cross the obtuse marginal 1 occluded  stent unsuccessfully.  Following this, the guide catheter was exchanged  for 7-French JR4 and an Asahi soft wire was then passed down the obtuse  marginal 2 vein graft to cross the proximal lesion.  Intravascular  ultrasound interrogation was performed.  Distal reference measured 5 x  5.7 mm.  The lesion measured 4.7 sq mm, not tight enough to warrant  percutaneous intervention.   Unsuccessful attempted percutaneous coronary intervention of occluded  obtuse marginal 1 and intracoronary vascular ultrasound interrogation of  saphenous vein graft to obtuse marginal 2 revealing moderate and no  though not physiologically significant lesion.  Continued medical  therapy will be recommended.  Heparin was discontinued.  Sheath will be  removed once the activated clotting time falls below 170.  The patient  left the lab in stable condition.      Quay Burow, M.D.  Electronically Signed     JB/MEDQ  D:  03/05/2009  T:  03/06/2009  Job:  [id number]   cc:   [facility] and [facility]  Tery Sanfilippo, MD  Verdell Carmine, M.D.

## 2011-02-26 NOTE — H&P (Signed)
NAME:  [name], [name]                      ACCOUNT NO.:  [id number]   MEDICAL RECORD NO.:  [id number]                   PATIENT TYPE:  INP   LOCATION:  3002                                 FACILITY:  [facility]   PHYSICIAN:  Elizabeth Sauer, M.D.                   DATE OF BIRTH:  1956/02/15   DATE OF ADMISSION:  10/29/2003  DATE OF DISCHARGE:                                HISTORY & PHYSICAL   ADMITTING DIAGNOSIS:  1. 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  2. 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   HISTORY:  This now 55 year old right-handed white gentleman who has had  lumbar disk disease several years ago, in June of 2004 had the anterior  cervical done on C4-5, C5-6, and C6-7.  He did well and had an increase in  his neck pain as well as pain into his left arm.  Films have demonstrated  nonunion at C4-5 although the other levels are well fused and the disk way  lateral at C3-4 on the left side.  He is now admitted for augmentation of  the C4-5 fusion as well as foraminotomy and fusion at C3-4 posteriorly.   MEDICAL HISTORY:  Remarkable for coronary artery disease and he has had  coronary artery bypass graft.  He has had several heart catheterizations,  most recently 6 months ago without difficulty.   MEDICATIONS:  Vicodin, Flexeril, and Norvasc.   ALLERGIES:  None.   FAMILY HISTORY:  Unremarkable.   REVIEW OF SYSTEMS:  Unremarkable for body structure, but he does have neck  shoulder and arm pain, and coronary artery disease.   PHYSICAL EXAMINATION:  HEENT:  Exam is within normal limits.  NECK:  He has limited range of motion in his neck because of discomfort.  CHEST:  Clear.  CARDIAC:  Exam is regular rate and rhythm.  ABDOMEN:  Nontender.  No hepatosplenomegaly.  EXTREMITIES:  Without clubbing or cyanosis.  Peripheral pulses are good.  GENITOURINARY:  Exam is deferred.  NEUROLOGIC:  He is awake, alert and oriented. Cranial nerves are intact.  Motor exam shows 5/5 strength throughout the upper extremities.  He  has a  mildly positive Hoffman's on the left side.  Reflexes are 1 throughout the  upper extremities, 1 at the knees, and absent at the ankles. He has mildly  positive Lhermitte's sign with neck compression.   RADIOGRAPHIC RESULTS:  Have been reviewed.   CLINICAL IMPRESSION:  1. 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  2. Cervical spondylosis with left C4 radiculopathy.   PLAN:  The plan is for posterior augmentation of C4-5, fusion; laminotomy,  foraminotomy on the left side; and inclusion with posterior fusion of the C3-  4 level.  The risks and benefits of this approach have been discussed with  him and he wishes to proceed.  Elizabeth Sauer, M.D.    MWR/MEDQ  D:  10/29/2003  T:  10/29/2003  Job:  [id number]

## 2011-02-26 NOTE — Discharge Summary (Signed)
[place]. [facility], [place]  Patient:    [name], [name]                   MRN: [id number] Adm. Date:  [id number] Disc. Date: 04/27/01 Attending:  Len Rangel Dictator:   Lestine Box, RNFA CC:         [name], M.D.   Discharge Summary  DATE OF BIRTH:  11/06/1955  ADMISSION DIAGNOSIS:  Angina pectoris.  PAST MEDICAL HISTORY: 1. Coronary artery disease, status post inferior myocardial infarction in June    of 2001 with percutaneous transluminal coronary angioplasty and repeat    cardiac catheterization in August of 2001. 2. Hypertension. 3. Hypercholesterolemia. 4. Kidney stones, status post multiple lithotripsies in fall of 2001.  PAST SURGICAL HISTORY: 1. Lumbar diskectomy in January of 1994 and May of 2001. 2. Left shoulder surgery in May of 1991.  ALLERGIES:  He is sensitive to ZOCOR.  It causes profound weakness.  TOPROL causes bradycardia.  DISCHARGE DIAGNOSES: 1. Progressive and recurrent coronary artery disease, class IV unstable    angina, status post coronary artery bypass grafting. 2. Postoperative blood loss anemia.  HOSPITAL COURSE AND PROCEDURES:  On April 18, 2001, [name] was admitted to [facility]. [facility] and [name], M.D., for cardiac catheterization.  The results of this study revealed left main stenosis and severe three-vessel coronary artery disease with preserved left ventricular function.  A cardiac surgery consult was obtained with [name], M.D., who recommended coronary artery bypass grafting for the treatment choice for this gentleman.  On April 19, 2001, he underwent Doppler studies, which revealed no significant carotid artery disease and he was noted to have palpable pedal pulses bilaterally.  On April 20, 2001, he underwent coronary artery bypass grafting x 4 by [name], M.D.  Grafts placed at the time of the procedure were left internal mammary artery to the  left anterior descending artery, saphenous vein grafted to the ramus, saphenous vein grafted to the first obtuse marginal, and saphenous vein grafted to the posterior descending artery.  At the conclusion of the procedure, he was transferred in stable condition to the SICU.  [name] has remained hemodynamically stable throughout his postoperative course, which has been notable only for postoperative anemia.  His hemoglobin drifted down from 10.4 that was measured on April 21, 2001, to a low of 7.6 measured on April 25, 2001.  On the morning of April 25, 2001, he also complained of being quite weak and slightly dizzy when he walked.  He was transfused with two units of packed red blood cells. On the morning of April 26, 2001, his hemoglobin was increased to 9.7 with a hematocrit of 27.5.  [name] is making very good progress towards discharge.  It is anticipated that he will be ready to be discharged home tomorrow on April 27, 2001.  CONDITION ON DISCHARGE:  Improved.  INSTRUCTIONS ON DISCHARGE:  Instructions include medications, activity, diet, wound care, and follow-up appointments.  Please see the discharge instruction sheet for details.  MEDICATIONS ON DISCHARGE: 1. Enteric-coated aspirin 325 mg p.o. q.d. 2. Tylox one to two p.o. q.4-6h. p.r.n. for pain. 3. Lopressor 25 mg b.i.d. 4. Niferex 150 mg p.o. q.d. 5. Lasix 40 mg p.o. q.d. x 7 days. 6. Potassium chloride 20 mEq p.o. q.d. x 7 days.  He has been instructed to resume his home medications of Neurontin at 600 mg p.o. b.i.d. and Altace  at a new dosage of 5 mg p.o. q.d., as well as Vioxx 50 mg q.d., Niaspan 500 mg p.o. q.h.s., and Valium 5 mg p.o. p.r.n.  FOLLOW-UP:  [name] has an appointment at the [place] office on Monday, May 01, 2001, for staple removal.  On that day, he will be given an appointment to see [name], M.D., back in the office in approximately three weeks and asked to get a chest x-ray  at [facility] about an hour and a half before that appointment.  He also has an appointment to see [name], M.D., at his office on May 17, 2001. DD:  04/26/01 TD:  04/26/01 Job: [id number]

## 2011-02-26 NOTE — Cardiovascular Report (Signed)
[place]. [facility]  Patient:    [name], [name]                   MRN: [id number] Proc. Date: 08/17/00 Adm. Date:  [id number] Attending:  Berry, Jonathan Martinique CC:         Octavia Heir, M.D.  Cardiac Catheterization Laboratory   Cardiac Catheterization  PROCEDURES PERFORMED: 1. Selective coronary angiography by Judkins technique. 2. Retrograde left heart catheterization. 3. Left ventricular angiography.  COMPLICATIONS:  None.  ENTRY SITE:  Right femoral.  DYE USED:  Omnipaque.  PATIENT PROFILE:  The patient is a 55 year old gentleman who underwent coronary artery stenting of his mid circumflex coronary artery in the atrial ventricular groove by Dr. Tami Ribas in the past.  He also had an ostial first obtuse marginal branch stent placed.  He has known diffuse mild disease in his right coronary artery and was well until the time of the current admission on August 16, 2000, when he presented with the sudden onset of substernal chest pain similar to prior episodes.  His CK was 630 but his MB was negative and his ECG was likewise negative.  When admitted and placed on heparin, nitroglycerin and Integrilin, the patient had urinary tract bleeding and his Integrilin was stopped.  Today he presents to the catheterization lab without a Foley catheter with no residual discomfort for elective cardiac catheterization.  RESULTS:  PRESSURES:  The left ventricular pressure was normal, the central aortic pressure was normal and there was no aortic valve gradient by pullback technique.  Coronary arteries showed that his left main coronary artery was patent.  The circumflex coronary artery contains some luminal irregularities in the midportion just proximal to the stent.  There is an obvious stepup and stepdown into the stented portion of the vessel but no lesions were noted. Distal runoff of the circumflex, which is basically a posterolateral branch  is widely patent.  The first obtuse marginal branch which arises before the circumflex stent was placed is also patent with good distal runoff.  An intermediate ramus branch contains a 40% stenosis midway down the vessel.  The left anterior descending coronary artery contains luminal irregularities proximal to the first septal perforator branch but nothing of any significance.  The right coronary artery contains a 40% proximal stenosis just beyond the pulmonary conus branch.  The distal vessel contains a segmental stenosis of up to 60% before the vessel bifurcates into a PDA and a PLA.  Diffuse disease is noted in this right coronary artery but nothing of any significance.  The left ventricle shows normal contractility with an ejection fraction estimate of 65%.  I see no mitral regurgitation or LV thrombus.  FINAL DIAGNOSES: 1. Trivial coronary artery disease.    a. A 60% stenosis in the distal right coronary artery.    b. A 40% stenosis of the mid intermediate ramus branch.    c. Widely patent stent to the atrial ventricular groove portion of       the circumflex coronary artery and widely patent ostial first       obtuse marginal branch stent. 2. Normal left ventricular systolic function.  PLAN:  Reassurance and continued medical therapy of mild coronary disease. DD:  08/17/00 TD:  08/18/00 Job: [id number]

## 2011-02-26 NOTE — Discharge Summary (Signed)
[place]. [facility]  Patient:    [name], [name]                   MRN: [id number] Adm. Date:  04/07/00 Disc. Date: 04/11/00 Attending:  Melton Krebs Dictator:   Berlin Hun, P.A. CC:         Alla German, M.D.                           Discharge Summary  DATE OF BIRTH:  June 19, 1956.  ADMISSION DIAGNOSES: 1. Chest pain, rule out myocardial infarction. 2. Chronic back pain, status post recent laminectomy.  DISCHARGE DIAGNOSES: 1. Coronary artery disease, positive subendocardial myocardial infarction,    status post percutaneous coronary intervention right coronary artery,    circumflex and first obtuse marginal. 2. Hypertension. 3. Lipid status pending - not ________ candidate. 4. Depression anxiety.  HISTORY OF PRESENT ILLNESS:  [name] is a 55 year old white male without prior history of coronary disease who presented to the ER on April 07, 2000, with complaints of intermittent substernal chest pain which began around 11:30 in the morning of admission.  He has a history of chronic back pain, status post laminectomy Feb 23, 2000, and is scheduled for back fusion next week. His pain today was associated with pain radiated to the left arm, shortness of breath, diaphoresis and nausea.  It resolved itself spontaneously this morning after about 30 minutes but then recurred around 1400 hours today with similar symptoms and persisted until presenting to the ER.  He has no prior cardiac history or work-up.  He denies tobacco abuse.  He has family history of coronary artery disease.  He has unknown lipid status.  He has no CHF symptoms and no history of PD or GERD.  His symptoms are worrisome for unstable angina.  Cardiac enzymes are pending. EKG is currently nonacute.  The patient will be admitted with IV heparin, IV nitroglycerin, beta-blockers, aspirin, Plavix and proton pump inhibitor therapy.  He will continue his outpatient  medications for back pain.  Plan cardiac catheterization in the morning.  PROCEDURES:  Cardiac catheterization April 08, 2000, by Alla German, M.D.  COMPLICATIONS:  None.  CONSULTATIONS:  None.  HOSPITAL COURSE:  The patient was admitted and placed on telemetry bed on IV nitroglycerin, IV heparin and the above stated medications.  EKG remained nonacute.  Cardiac enzymes were positive x 3 with a CK peak of 505 and troponin I peak of 1.01.  Potassium 3.7, BUN 15, creatinine 0.9.  LFTs were within normal limits.  WBC 9.1, hemoglobin 13.1, platelets 309.  Lipid profile pending at the time of this dictation.  The patient was taken to the cardiac catheterization lab on April 08, 2000, by Dr. Alla German after ruling in for MI.  This revealed a normal left main, normal LAD, circumflex with 70% mid lesion, OM1 with 99% ostial lesion and diffusely diseased RCA with 50% proximal and mid lesions and a 99% distal long lesion with an 80% branch lesion.  EF was 60% with posterior basal HK.  Distal aortography revealed no renal artery stenosis.  Dr. Tami Ribas preceded with RCA, PTCA and stenting with a Tetra 2.75/23 stent reducing lesion from 99% to less than 10%.  He also performed PTCA and stenting of a mid circumflex lesion reducing it from 70 to less than 10% and on OM ostial lesion stented reducing lesion from 99 to less than 50%.  The patient tolerated the procedure well.  ReoPro was bolused and infused x 12 hours.  ACE inhibitor therapy was started.  The patient remained stable post MI and post PCI.  On April 11, 2000, the patient was deemed stable for discharge to home.  DISCHARGE MEDICATIONS:  1. Norvasc 10 mg a day.  2. Altace 10 mg b.i.d.  3. Plavix 75 mg a day for a month.  4. Toprol XL 25 mg a day.  5. Imdur 30 mg a day.  6. Aspirin 325 mg a day.  7. Protonix 40 mg a day.  8. Paxil 10 mg a day.  9. Vioxx 25 mg a day. 10. Valium 5 mg q.6h. as needed. 11. Neurontin 300 mg  three times a day. 12. Soma 250 mg three times a day.  ACTIVITY:  The patient should do no strenuous activity more than five pounds, sexual activity or driving for three days.  DIET:  He should follow a low fat, low cholesterol, low salt diet.  WOUND CARE:  He may shower but not soak in the tub for a week or swim for a week.  DISCHARGE INSTRUCTIONS:  He is to call the office for any problems or questions.  FOLLOW-UP:  A follow-up appointment has been scheduled with Dr. Tami Ribas on April 25, 2000, at 10:10.  A Persantine Cardiolite stress test is scheduled for May 13, 2000, at 8:30 in the morning.  We will need to postpone his lumbar fusion until he has recovered from his MI. DD:  04/11/00 TD:  04/11/00 Job: [id number]

## 2011-02-26 NOTE — Cardiovascular Report (Signed)
[place]. [facility]  Patient:    [name], [name]                   MRN: [id number] Proc. Date: 04/18/01 Adm. Date:  [id number] Attending:  Octavia Heir CC:         Cardiac Catheterization Laboratory  Elizabeth Sauer, M.D.   Cardiac Catheterization  PROCEDURE:  Cardiac catheterization.  CARDIOLOGIST:  Alla German, M.D.  INDICATIONS:  Mr. Balsbaugh is a 55 year old white male, a patient of Dr. Reggy Eye and Dr. Wyonia Hough. Roys at [facility], who was admitted to the emergency room on March 18, 2000, with a myocardial infarction, with subsequent disease of his RCA, and underwent a PTCA and stenting of his PDA.  The patient was also noted to have an AV groove lesion which underwent a PTCA and stenting.  He had a staged procedure for a PTCA and stenting of the second obtuse marginal.  Since that time the patient has done well; however, he began to complain of chest pain one week ago.  The patient was seen in the office and was scheduled for an elective cardiac catheterization.  DESCRIPTION OF PROCEDURE:  After giving an informed written consent, the patient was brought to the cardiac catheterization laboratory.  The right and left groins were shaved, prepped, and draped in the usual sterile fashion. Electrocardiogram monitoring was established.  Using a modified Seldinger technique a 6-French arterial sheath was inserted in the right femoral artery. The 6-French diagnostic catheters were then used to perform a diagnostic angiography.  RESULTS 1. Left main coronary artery:  This reveals a large left main with a 50%    ostial and proximal stenosis. 2. Left anterior descending coronary artery:  The Left anterior descending    coronary artery is a medium-sized vessel which coursed to the apex and    gave rise to one diagonal branch.  The LAD is noted to have a 30% proximal,    a 30% mid, and a 30% distal stenosis.  There were course irregularities.    The  first obtuse marginal is a medium-sized vessel which bifurcates    in its midsegment.  It has no significant disease. 3. Left circumflex coronary artery:  The left circumflex coronary artery is a    large vessel which coursed in the AV groove and gave rise to five obtuse    marginal branches.  The AV groove circumflex is noted to have a stent    in its midsegment beyond the takeoff of the fourth of obtuse marginal,    which has no evidence of in-stent restenosis.  The first OM is a large    vessel with 40% proximal stenosis.  The second and third marginals are    small vessels with no significant disease.  The fourth obtuse marginal    is a medium-sized vessel which is noted to have a stent in its ostial    portion with approximately 60% in-stent restenosis.  The fifth OM is a    large vessel with no significant disease. 4. Right coronary artery:  The right coronary artery is a medium-sized    vessel which is noted to be coarsely irregular and gives rise to the    PDA as well as the posterolateral branch.  The RCA is noted to have a    30% ostial and proximal, a 40% mid, and a 50% distal in-stent stenosis.    The PDA and posterolateral branch are coarsely  irregular, but have no    high-grade stenosis.  LEFT VENTRICULOGRAM:  Reveals a preserved ejection fraction at 60%.  There is significant ectopy noted.  HEMODYNAMICS Systemic arterial pressure:  154/96. LV systemic pressure:  154/80. LVEDP:  12.  INTERVENTITIONAL PROCEDURE:  Following the diagnostic angiography, a 6-French JL4 guiding catheter was then coaxially  engaged in the left coronary ostium. Next, 2000 units of IV heparin were given.  A 0.014 short Patriot guide wire was then advanced out of the guiding catheter into the proximal LAD.  The guide wire was positioned in the distal LAD without difficulty.  Next, an UltraCross 73 MHz IVUS imaging catheter was advanced into the proximal LAD. Mechanical pullback was then performed  throughout the proximal LAD, as well as the distal and proximal left main.  The distal left main revealed a 7.0 mm x 7.0 mm vessel diameter with a 6.0 mm x 6.5 mm luminal diameter.  The proximal left main was noted to have a vessel diameter of 6.5 x 6.0, with a luminal diameter of 3.0 cm x 3.5 cm.  A follow-up angiogram revealed no evidence of dissection or thrombus, with TIMI-3 flow to the distal vessel.  At this point we elected to conclude the procedure.  All balloons, wires, and catheters were removed.  Hemostatic sheaths were sewn in place, and the patient was returned back to the floor in a stable condition.  CONCLUSIONS: 1. Successful intervascular ultrasound imaging of the left main coronary    artery, revealing a luminal diameter of approximately 3.0 cm x 3.5 cm.    It is noted that the patient had pressure damping when engaged, and    developed chest pain with the IVUS catheter across the left main coronary    artery. 2. Noncritical disease of the left anterior descending coronary artery,    circumflex, and right coronary artery. 3. Normal left ventricular systolic function. 4. Systemic hypertension.DD:  04/18/01 TD:  04/18/01 Job: [id number]

## 2011-02-26 NOTE — Op Note (Signed)
. [facility]  Patient:    [name], [name]                   MRN: [id number] Proc. Date: 04/20/01 Adm. Date:  [id number] Attending:  Len Childs CC:         Alla German, M.D.  [facility]   Operative Report  PREOPERATIVE DIAGNOSIS:  Class 4 unstable angina, left main stenosis, severe three-vessel coronary artery disease, preserved left ventricular function.  POSTOPERATIVE DIAGNOSIS:  Class 4 unstable angina, left main stenosis, severe three-vessel coronary artery disease, preserved left ventricular function.  OPERATION PERFORMED:  Coronary artery bypass grafting x 4 (left internal mammary artery to left anterior descending, saphenous vein graft to the ramus intermediate, saphenous vein graft to obtuse marginal, saphenous vein graft to posterior descending0.  SURGEON:  Len Childs, M.D.  ASSISTANT: 1. Gaye Pollack, M.D. 2. Lestine Box, RNFA  ANESTHESIA:  General.  ANESTHESIOLOGIST:  Dr. Kate Sable.  INDICATIONS FOR PROCEDURE:  The patient is a 55 year old male with a known history of coronary disease who presents with unstable angina.  He had undergone angioplasty, stenting of the circumflex and distal right coronary arteries in March of this year.  He returned with progressive chest pain and dyspnea on exertion and was recathed by Dr. Tami Ribas which demonstrated a 60 to 70% left main stenosis which as confirmed by intravascular ultrasound.  He was referred for surgical coronary revascularization.  The stent to the right coronary had restenosis to a significant degree.  Prior to the operation, I examined the patient in his hospital room and reviewed the results of the last cath with the patient and his family.  I discussed the indications and expected benefits of coronary artery bypass grafting as treatment for his coronary artery disease.  I reviewed the alternatives to surgical therapy for his coronary  artery disease.  I reviewed the major aspects of the operation including the choice of conduit for bypass grafting, the location of the surgical incisions, the use of general anesthesia and cardiopulmonary bypass, and the expected postoperative recovery period.  I reviewed the risks associated with the operation with the patient including the risks of MI, CVA, bleeding, infection, and death.  He understood these indications for the surgery and agreed to proceed with the operation as planned under informed consent.  OPERATIVE FINDINGS:  The patients body habitus made exposure of the posterior aspect of the heart difficult.  He had cardiomegaly.  The ascending aorta had a plaque.  The LAD had diffuse plaque disease.  The saphenous vein was of average to above average quality.  The mammary artery was 2 to 2.5 mm with excellent flow.  DESCRIPTION OF PROCEDURE:  The patient was brought to the operating room and placed supine on the operating table where general anesthesia was induced under invasive hemodynamic monitoring.  The chest, abdomen and legs were prepped with Betadine and draped as a sterile field.  A median sternotomy was performed and the saphenous vein was harvested from the right leg.  The internal mammary artery was harvested as a pedicle graft from its origin at the subclavian vessels.  Heparin was administered and the ACT was documented as being therapeutic.  The patient was cannulated through pursestring sutures in the ascending aorta and right atrium and placed on bypass and cooled to  32 degrees.  The coronaries were identified and the LAD, ramus, OM, and posterior descending were found to be adequate vessels  for grafting.  The mammary artery and vein grafts were prepared for the distal anastomoses and a cardioplegia cannula was placed.  The patient was cooled to 28 degrees and as the aortic crossclamp was applied, 600 cc of cold blood cardioplegia was delivered to  the aortic root with immediate cardioplegic arrest and septal temperature dropping to less than 12 degrees.  Topical iced saline slush was used to augment myocardial preservation and a pericardial insulator pad was used to protect the left phrenic nerve.  The distal coronary anastomoses were then performed.  The first distal anastomosis was to the posterior descending coronary artery.  This was a 2.0 mm vessel with proximal 80% stenosis in the previously placed stent.  The reversed saphenous vein was sewn end-to-side with running 7-0 Prolene with good flow through the graft.  The second distal anastomosis was to the ramus intermediate which was a 2.0 mm vessel with proximal 80% stenosis.  A reversed saphenous vein was sewn end-to-side with running 7-0 Prolene with good flow through the graft.  The third distal anastomosis was to the large circumflex marginal which was a 2.2 mm vessel with proximal 80% stenosis.  A reversed saphenous vein was sewn end-to-side with running 7-0 Prolene and there was good flow through the graft.  Cardioplegia was redosed.  The fourth distal anastomosis was to the distal third of the LAD where the plaque formation was at its minimum.  The left internal mammary artery pedicle was brought through an opening created in the left lateral pericardium and was brought down on the LAD and sewn end-to-side with running 8-0 Prolene.  There was excellent flow through the anastomosis with immediate rise in septal temperature after release of the pedicle clamp on the mammary artery.  The mammary pedicle was secured to the epicardium and the aortic crossclamp was removed.  Three proximal vein anastomoses were placed on the ascending aorta using a partial occlusion clamp and a 4.4 mm punch and running 6-0 Prolene.  The heart was cardioverted back a regular rhythm and the partial occlusion clamp was removed.  Each graft had excellent flow and hemostasis was documented at  the  distal and proximal anastomoses.  The patient was rewarmed to 37 degrees and temporary pacing wires were applied.  When the patient reached 37 degrees the lungs were expanded and the ventilator was resumed.  The patient was weaned from bypass without inotropes with stable blood pressure and cardiac output. Protamine was administered and the cannulas were removed. There was diffuse coagulopathy consistent with prolonged preoperative use of Plavix.  The patient was given a platelet transfusion with improvement in coagulation.  The mediastinum was irrigated with a warm antibiotic irrigation.  The leg incision was irrigated and closed in a standard fashion.  The pericardium was loosely reapproximated.  Two mediastinal and a left pleural chest tube were placed and brought out through separate incisions.  The sternum was reapproximated with interrupted steel wire and the pectoralis fascia was closed with interrupted #1 Vicryl.  The subcutaneous and skin were closed with running Vicryl and a sterile dressing was applied.  Total bypass time was 120 minutes with aortic crossclamp time of 50 minutes. DD:  04/20/01 TD:  04/21/01 Job: [id number]

## 2011-02-26 NOTE — Consult Note (Signed)
[place]. [facility]  Patient:    [name], [name]                   MRN: [id number] Proc. Date: 08/17/00 Adm. Date:  [id number] Attending:  Berry, Jonathan Rangel CC:         [name], M.D.   Consultation Report  REASON FOR CONSULTATION:  Painless total gross hematuria.  HISTORY OF PRESENT ILLNESS:  [name] is a 55 year old male.  He has coronary artery disease and is status post myocardial infarction earlier this year.  He has had several stents, as well as angioplasties.  He has no significant urologic history.  Last evening, he developed significant diaphragmatic/substernal chest pain.  He was admitted for rule out myocardial infarction and is to have cardiac catheterization today.  As part of his treatment algorithm, he was started on anticoagulation last evening.  He developed gross total painless hematuria.  He reports that the urine was a very light pink initially, but then became a cranberry to burgundy color.  He had no passage of clots.  He had no frequency, dysuria, or change in his urinary stream.  He has had no flank or abdominal pain.  As a baseline, he has no significant voiding complaints, and denies any gross hematuria.  There is mention of a similar episode of his last catheterization, but the patient did not recall this.  He is not a smoker.  There is no personal or family history of kidney stone disease.  He is unaware if he has had any recent abdominal imaging.  PAST MEDICAL HISTORY:  Significant for his cardiac history.  He does have hyperlipidemia and hypertension.  He has had some chronic back problems, and does require a spinal fusion in the future.  CURRENT MEDICATIONS:  Imdur, Neurontin, diazepam, Lipitor, Norvasc, Vioxx, aspirin, clonidine.  ALLERGIES:  He has no drug allergies, but some intolerance to BETA BLOCKERS.  HABITS:  He is not a smoker.  FAMILY HISTORY:  Notable for coronary artery disease  but negative for urologic history such as prostate cancer or kidney stones.  PHYSICAL EXAMINATION TODAY:  GENERAL:  He is a well-developed, well-nourished male.  VITAL SIGNS:  He is afebrile.  His blood pressure is 114/70 with a pulse of 60.  MENTAL STATUS:  He was alert and oriented.  ABDOMEN:  Soft.  GENITOURINARY:  External genitalia reveals no obvious abnormalities.  The penis was without plaques or lesions.  The scrotum, testes, and external structures are within normal limits.  RECTAL:  Unremarkable with a small prostate.  DATA:  Urinalysis is notable for large amount of blood.  His labs are otherwise unremarkable, with a normal CBC and normal renal function.  Of note, his anticoagulation has not been discontinued, and his most recent void shows a light tea-colored urine.  ASSESSMENT:  Total painless gross hematuria.  One cannot assume that this is due to the anticoagulation.  Occasionally, patients will have urologic pathology that would not normally bleed under normal circumstances, but with anticoagulation, this can then result in hematuria.  This is a common way that we pick up small bladder or renal malignancies.  Certainly, nothing may be found, but he does need a complete evaluation.  I would recommend contrast as well as non-contrast abdominal and pelvic CT scan, followed by flexible cystoscopy at the bedside.DD:  08/17/00 TD:  08/17/00 Job: [id number]

## 2011-02-26 NOTE — Op Note (Signed)
NAME:  [name], [name]                      ACCOUNT NO.:  [id number]   MEDICAL RECORD NO.:  [id number]                   PATIENT TYPE:  INP   LOCATION:  3002                                 FACILITY:  [facility]   PHYSICIAN:  Elizabeth Sauer, M.D.                   DATE OF BIRTH:  Apr 18, 1956   DATE OF PROCEDURE:  10/29/2003  DATE OF DISCHARGE:                                 OPERATIVE REPORT   PREOPERATIVE DIAGNOSIS:  Cervical spondylosis at C3-4 with foraminal  stenosis, question nonunion at C4-5.   POSTOPERATIVE DIAGNOSIS:  Cervical spondylosis at C3-4 with foraminal  stenosis, question nonunion at C4-5.   OPERATION PERFORMED:  C3-4, C4-5 posterior cervical fusion and C3-4  foraminotomy.   SURGEON:  Elizabeth Sauer, M.D.   ANESTHESIA:  General endotracheal.   PREP:  Sterile Betadine prep and scrub with alcohol wipe.   ASSISTANT:  1. Hosie Spangle, M.D.  [place].   INDICATIONS FOR PROCEDURE:  The patient is a 55 year old gentleman with  severe cervical spondylosis.   DESCRIPTION OF PROCEDURE:  The patient was taken to the operating room,  smoothly anesthetized, intubated, and placed prone on the operating table in  the [place] head holder.  Following shave, prep and drape in the usual  sterile fashion, the skin was incised from the bottom of C2 to the top of C6  and the laminae of C3, C4, C5, and C6 were exposed bilaterally in  subperiosteal plane.  Intraoperative x-ray was confirmed by counting down  from C2.  Laminotomy of C3-4 was carried out with decompression of the right  3 root.  Following decompression, it was carefully palpated and found to be  free throughout.  There was no disk identified.  The lateral mass screw was  then placed using the standard landmarks at C3, C4 and C5.  The rod was  attached to the top of them.  Intraoperative x-ray showed good placement of  the screws.  Rod holders were tightened.  The laminae were decorticated with  a high speed drill  and packed with DBX and cancellous bone graft.  Prior to  this, the wound had been irrigated and hemostasis assured.  The fascia was  then reapproximated with 0 Vicryl in interrupted fashion and subcutaneous  tissue reapproximated with 0 Vicryl in interrupted fashion, subcuticular  tissues reapproximated with 3-0 Vicryl with interrupted fashion.  Skin was  closed with 3-0 nylon in running locked fashion.  Betadine Telfa dressing  was applied and made occlusive with OpSite.  Patient was placed in [place]  collar and returned to the recovery room in good condition.                                               Elizabeth Sauer, M.D.  MWR/MEDQ  D:  10/29/2003  T:  10/30/2003  Job:  [id number]

## 2011-02-26 NOTE — Cardiovascular Report (Signed)
[place]. [facility]  Patient:    [name], [name]                   MRN: [id number] Proc. Date: 04/08/00 Adm. Date:  [id number] Attending:  Alla German H                        Cardiac Catheterization  PROCEDURES: 1. Left heart catheterization. 2. Coronary angiography. 3. Left ventriculogram. 4. Abdominal aortogram. 5. RCA-distal:  Percutaneous transluminal coronary balloon angioplasty.  Also    placement of intracoronary stent. 6. Left circumflex - mid:  Placement of intracoronary stent. 7. OM1 - ostial:  Percutaneous transluminal coronary balloon angioplasty. Also    placement of intracoronary stent.  COMPLICATIONS:  None.  INDICATIONS:  [name] is a 55 year old white male with a history of hypertension and positive family history of CAD.  He presented to the ER on April 07, 2000 with substernal chest pain, radiating to both arms.  There were no acute ST changes.  He did have mildly elevated CKs.  He is now brought to the cardiac catheterization lab to define his coronary anatomy.  DESCRIPTION OF PROCEDURE:  After giving informed written consent, the patient was brought to the cardiac catheterization lab.  His right and left groins were shaved, prepped and draped in the usual sterile fashion.  His ECG monitoring was established.  Using modified Seldinger technique, a 6-French arterial sheath was inserted in the right femoral artery.  Six-French diagnostic catheters were then used to perform diagnostic angiography.  DIAGNOSTIC ANGIOGRAPHY: 1. Left Main Trunk:  Medium sized.  No significant disease. 2. Left Anterior Descending:  A medium-sized vessel which coursed to the    apex.  The LAD was irregular, but had no high-grade lesion.  This also    gave rise to a medium-sized ramus intermedius, with no significant disease. 3. Left Circumflex:  The AV circumflex was a large vessel, which gave rise    to two obtuse marginal branches.  The AV  circumflex is noted to have 70%    stenotic lesion just distal to the first OM.  The first OM is a large    vessel with a 99% ostial lesion.  The second OM is a large vessel with no    significant disease. 4. Right Coronary Artery:  A large, dominant vessel.  This gives rise to a PDA    as well as a posterolateral branch.  There is 50% early mid, and 50% mid    vessel stenotic lesions.  There is diffuse 99% disease throughout the    distal RCA, extending into the ostium of the PDA.  The ostium of the    posterolateral branch has 80% ostial lesion.  LEFT VENTRICULOGRAM:  Reveals a 60% EF.  There is mild posterobasilar hypokinesis.  DESCENDING AORTOGRAM:  Reveals no evidence of renal artery stenosis and normal descending aorta.  HEMODYNAMICS: 1. Systemic arterial pressure:  170/104. 2. Left ventricular systemic pressure:  162/18. 3. Left ventricular end-diastolic pressure:  20.  INTERVENTIONAL PROCEDURE:  RCA - Distal:  Following diagnostic angiography, a 7-French arterial sheath was then inserted into the right femoral artery.  A 7-French JR4 guiding catheter with side holes was then engaged into the right coronary artery ostium.  Next, a Patriot 0.014 exchange-length guide wire was advanced by the guiding catheter to the proximal RCA.  The guide wire was able to cross in the distal PDA without difficulty.  Next, multiple attempts were used with the second wire to cross into the posterolateral branch, but these were unsuccessful.  We then tracked a Maverick 2.5 x 20 mm balloon into the distal RCA and into the ostium of the PDA.  Multiple inflations were then performed throughout the ostial PDA as well as the distal RCA.  Follow-up angiogram revealed persistent irregularities in the distal RCA.  We elected to proceed with intracoronary stenting.  The balloon was then removed and a Tetra 2.75 x 23 mm stent was then positioned over the distal RCA lesion, extending into the ostium  of the PDA.  The stent was then deployed to a maximum of 12 atm for a total of 50 sec.  Follow-up angiogram revealed no evidence of dissection or thrombus.  There was TIMI-3 flow to the distal vessel.  We then pulled the Patriot guide wire back; was successful in crossing into the posterolateral branch.  We then tracked a 2.75 x 15 mm Maverick into the ostium of the posterolateral branch; one inflation was then performed to a maximum of 12 atm for a total of 70 sec.  Follow-up angiogram revealed no evidence of dissection or thrombus, with TIMI-3 flow to the distal vessel.  At this point we turned our attention to the circumflex system.  The RCA guide was then exchanged for a 7-French JL3.5 guiding catheter, which was engaged into the left coronary ostium.  Next, a 0.014 exchange-length Patriot guide wire was advanced beside the guiding catheter and positioned to the distal second limb without difficulty.  A second Patriot short 0.014 guide wire was then positioned in the distal first OM without difficulty.   Next, a Maverick 3.0 x 15 mm balloon was then tracked over the OM1, and a guide wire positioned across the ostium.  Multiple inflations to a maximum of 14 atm was then performed for a total of 55 sec.  We then elected to proceed with primary stenting of the AV groove circumflex, using a Medtronic S7 3.5 x 12 mm stent.  We then positioned this in the mid circumflex and deployed to a maximum of 16 atm for a total of 60 sec. Follow-up angiogram revealed no evidence of dissection or thrombus, with TIMI-3 flow to the distal vessel.  We then elected to proceed with stenting of the ostium of the first OM, using the Medtronic 3.0 x 9 mm stent.  This stent was carefully positioned to ensure coverage with the ostium in multiple views.  This was then deployed to a maximum of 14 atm for a total of 48 sec.  Follow-up angiogram in additional views revealed evidence that the ostium was not  covered with a stent.  Several inflations were then performed using the same stent balloon, to a maximum of 14 atm for a total of 3 min.  Follow-up  angiogram revealed a 50% residual stenosis in the ostium of the first diagonal, with TIMI-3 flow to the distal vessel.  Because of the complexity of the case, we elected to proceed with ReoPro administration.  Intravenous dose of heparin was given to maintain the ACT between 200 and 300.  Final orthogonal angiogram revealed less than 10% residual stenosis in the distal PDA, RCA and posterolateral branch.  In addition, there is less than 10% residual stenosis in the AV groove circumflex.  There is approximately 50% residual stenosis in the ostium of the first obtuse marginal.  At this point we elected to conclude the procedure.  All balloons, wires and  catheters were removed.  Hemostatic sheath was sewn in place.  The patient was returned back to the [facility] in stable condition.  He will continue with ReoPro infusion to complete 12 hours.  CONCLUSIONS: 1. Successful percutaneous transluminal coronary balloon angioplasty and    placement of a Tetra 2.75 x 23 mm coronary stent in the distal RCA/PDA    stenotic lesion. 2. Successful primary stenting of the AV groove circumflex.  Using a    Medtronic S7 3.5 x 12 mm coronary stent. 3. Successful percutaneous transluminal coronary balloon angioplasty and    placement of a Medtronic S7 3.0 x 9 mm coronary stent in the ostium of the    first obtuse marginal. 4. Normal left ventricular systolic function, with wall motion abnormalities    as noted above. 5. No evidence of renal artery stenosis. 6. Systemic hypertension. 7. Adjunct use of Aggrastat infusion.    S7 DD:  04/08/00 TD:  04/09/00 Job: [id number]

## 2011-02-26 NOTE — H&P (Signed)
NAME:  [name], [name]                      ACCOUNT NO.:  [id number]   MEDICAL RECORD NO.:  [id number]                   PATIENT TYPE:  INP   LOCATION:  NA                                   FACILITY:  [facility]   PHYSICIAN:  Elizabeth Sauer, M.D.                   DATE OF BIRTH:  [birth year]   DATE OF ADMISSION:  DATE OF DISCHARGE:                                HISTORY & PHYSICAL   ADMISSION DIAGNOSIS:  Cervical spondylosis C4-5, C5-6, and C6-7.   HISTORY OF PRESENT ILLNESS:  This is a 55 year old right-handed gentleman  who had had lumbar disk done several years ago.  He has developed increasing  neck and upper extremity pain.  He has some spondylitic disease at C4-5, C5-  6, and C6-7.  He has undergone cervical epidural steroids which helped only  transiently.  He has significant neck and bilateral upper extremity pain and  is now admitted for anterior cervical diskectomy and fusion at those levels.   PAST MEDICAL HISTORY:  1. Coronary artery disease.  He has had coronary artery bypass graft.  2. He has also had several heart catheterizations, most recently about three     weeks ago which showed minimal coronary artery disease.   MEDICATIONS:  1. Vioxx.  2. Vicodin.  3. Flexeril.  4. Norvasc.  5. He is going to be evaluated for new blood pressure medicine while he is     in the hospital.   ALLERGIES:  None.   FAMILY HISTORY:  Unremarkable.   REVIEW OF SYSTEMS:  Unremarkable for bladder dysfunction but he does have  neck, shoulder, and arm pain; coronary artery disease.   PHYSICAL EXAMINATION:  HEENT:  Within normal limits.  NECK:  He has reasonable range of motion of his neck.  CHEST:  Clear.  CARDIAC:  Regular rate and rhythm.  ABDOMEN:  Nontender.  No hepatosplenomegaly.  EXTREMITIES:  Without clubbing or cyanosis.  Peripheral pulses are good.  NEUROLOGIC:  He is awake, alert, and oriented.  His cranial nerves are  intact.  Motor exam shows 5/5 strength throughout the  upper extremities.  He  has a mildly positive Hoffmann's on the left side.  Reflexes are otherwise 1  throughout the upper extremities, 1 at the knees, absent at the ankles.  He  has a mildly positive Lhermitte's sign with neck extension.   CLINICAL IMPRESSION:  Cervical spondylosis with early myelopathy.   PLAN:  The plan is for an anterior cervical diskectomy and fusion at C4-5,  C5-6, and C6-7.  The risks and benefits of this approach have been discussed  with him and he wishes to proceed.  Elizabeth Sauer, M.D.    MWR/MEDQ  D:  03/26/2003  T:  03/26/2003  Job:  [id number]

## 2011-02-26 NOTE — Op Note (Signed)
[place]. [facility]  Patient:    [name], [name]                   MRN: [id number] Proc. Date: 02/23/00 Adm. Date:  [id number] Attending:  Melton Krebs                           Operative Report  PREOPERATIVE DIAGNOSIS:  Herniated disk on the right side at L5-S1 and on the left side at L4-5.  POSTOPERATIVE DIAGNOSIS:  Herniated disk on the right side at L5-S1 and on the left side at L4-5.  OPERATION PROCEDURE:  L4-5 and L5-S1 left and right laminectomy and diskectomy respectively.  SURGEON:  Elizabeth Sauer, M.D.  SERVICE:  Neurosurgery.  ANESTHESIA:  General endotracheal.  PREPARATION:  Sterile Betadine prep and scrub with alcohol wipe.  COMPLICATIONS:  None.  BODY OF TEXT:  A 55 year old, right-handed, white gentleman with a right 5-1 disk and a left 4-5 disk, and right S1 and left L5 radiculopathies.  He was taken to the operating room.  Smooth anesthesia and intubated.  He was placed prone on the operating table.  Following shaving, prepping, and draping in usual sterile fashion, the skin was infiltrated with 1% lidocaine with 1:400,000 epinephrine.  The skin was incised from the top of L4 to the bottom of L5, and the lamina of L4, L5, and S1.  The L5-S1 interspace was exposed on the right side, and the L4-5 interspace was exposed on the left side.  An intraoperative x-ray confirmed correctness of the level.  Starting on the right side, hemi/semi-laminectomy of L5 was carried out to the top the ligamentum flavum, which was removed in a retrograde fashion.  The lateral recess was decompressed, and the lateral aspect of the S1 nerve root was identified and gently dissected free of the free fragment of disk, which was underneath it.  This was grasped and removed without difficulty.  The disk space was carefully explored.  All marginally clinging fragments were removed.  Following careful exploration of the disk space, the nerve root itself  was explored in all quadrants and found to be free.  The wound was irrigated, and it was covered with Depo-Medrol soaked fat.  Attention was then turned to the left L4-5 interspace, where similarly, a hemi/semi-laminectomy of the left L4 lamina was carried out with removal of the ligamentum flavum, once again in retrograde fashion exposing the lateral aspect of the L5 root.  This was dissected free from a herniated disk which was contained under some annular fibers.  It was significantly compressing the left L5 root.  The annular fibers were divided, and the disk delivered itself.  Following this, the interspace was carefully explored, and there being no fragments found, the wound was irrigated.  Hemostasis was assured.  The nerve root was explored one more time, and then it was covered with Medrol soaked fat.  The fascia was reapproximated with #0 Vicryl in interrupted fashion.  The subcutaneous tissue was reapproximated with #0 Vicryl in interrupted fashion.  The subcuticular tissues were reapproximated with 3-0 Vicryl in interrupted fashion.  The skin was closed with 3-0 nylon in a simple running fashion.  A Betadine Telfa dressing was applied and made occlusive with OpSite.  The patient was returned to the recovery room in good condition. DD:  02/23/00 TD:  02/23/00 Job: [id number]

## 2011-02-26 NOTE — Discharge Summary (Signed)
[place]  Patient:    [name], [name]                   MRN: [id number] Adm. Date:  [id number] Disc. Date: [id number] Attending:  Octavia Heir Dictator:   [facility] [place], [place].A.                           Discharge Summary  ADMISSION DIAGNOSES: 1. Hyperlipidemia. 2. Hypertension. 3. History of coronary artery disease, status post angioplasty of the right    coronary artery, mid circumflex, and ostial first obtuse marginal recently. 4. Family history of coronary artery disease. 5. Status post recent Persantine Cardiolite with possible ischemia.  DISCHARGE DIAGNOSES: 1. Hyperlipidemia. 2. Hypertension. 3. History of coronary artery disease, status post angioplasty of the right    coronary artery, mid circumflex, and ostial first obtuse marginal recently. 4. Family history of coronary artery disease. 5. Status post recent Persantine Cardiolite with possible ischemia. 6. Status post cardiac catheterization on May 12, 2000, by Dr. Alla German with percutaneous transluminal coronary angioplasty/stent to the    ostial obtuse marginal 1 and percutaneous transluminal coronary angioplasty    to the mid circumflex.  Ejection fraction is 50% at catheterization.  HISTORY OF PRESENT ILLNESS:  [name] presented to the office on April 25, 2000, for follow-up of his recent angioplasty and his distal RCA, mid circumflex, and ostial first obtuse marginal.  He is a 55 year old white male, patient of [place] [facility] [employer], with a history of hypertension and positive family history of CAD.  He had presented to the ER on April 07, 2000, with substernal chest pain radiating to both arms, with no acute ST changes. He did have mildly elevated CKs and was brought to the cardiac catheterization laboratory on April 08, 2000, to define his coronary anatomy.  At that time, he was noted to have normal left main as well as LAD and ramus intermedius.   He had a 70% mid circumflex lesion and a 99% ostial first OM, which was a medium-sized vessel.  Additionally, he was noted to have diffuse disease to the distal RCA up to 99% in the distal portion extending into the PDA with an ostial 80% PLA lesion.  His EF was 50% with posterior basal hypokinesis.  He did have renal artery stenosis.  He underwent primary stenting of his mid circumflex using a ______ 3.5 x 12 mm coronary stent.  He also had stenting of his first OM using ______ 39 mm.  However, despite multiple tries, the ostium of the OM was not completely covered, and there was a 50% residual stenosis.  He additionally underwent PTCA and stenting of his distal RCA and the PDA using a Tetra 2.75 x 23 mm stent.  He then underwent a PTCA of the posterolateral branch using a Maverick 2.5 x 20 mm balloon.  He had an uneventful postoperative course.  He returned to the office on April 25, 2000, with complaints of intermittent chest pain which was different in nature from his anginal pain that he had in the hospital.  However, this had happened several times since his discharge, and there was concern that it may represent ongoing ischemia.  He had not used any sublingual nitroglycerin.  At that time, Dr. Tami Ribas scheduled the patient for a Persantine Cardiolite to rule out ischemia.  He was continued on current medications.  On  April 28, 2000, [name] underwent Persantine Cardiolite which revealed evidence of inferior wall scar.  There was a very minimal amount of peri-infarct ischemia in the inferior wall.  HOSPITAL COURSE:  On May 12, 2000, [name] underwent cardiac catheterization by Dr. Alla German.  Dr. Tami Ribas performed PTCA and stent to the ostial OM 1.  This went from a 90% stenosis to a 20% residual.  He also performed PTCA to the mid left circumflex vessel.  The LV was estimated at 50%.  At that time, Dr. Tami Ribas planned to continue ReoPro for 12 hours and then  discontinue.  He planned for Plavix for one month.  We will plan for hypertension control.  On May 13, 2000, [name] is stable.  He has had no further chest pain. He did have an episode of nausea and vomiting last night after dinner, but now it is resolved.  He has been afebrile, blood pressure stable.  Groin is okay. There is no hematoma, and he has good distal pulses.  His temperature is 98.4, blood pressure 110/65, heart rate 55, respirations 16.  Laboratories are all stable with CBC 8.8, hemoglobin 12, hematocrit 35, platelets 320.  Sodium 138, potassium 3.8, BUN 17, creatinine 1.1.  Cardiac enzymes negative.  He is felt to be stable for discharge home at this time.  We will stop his Toprol secondary to low heart rate.  We will continue Plavix for one month and have him follow up in the office in two to three weeks.  CONSULTATIONS:  None.  PROCEDURES:  Cardiac catheterization on May 12, 2000, by Dr. Alla German, with PTCA/stent to the ostial OM 1 and PTCA to the mid left circumflex.  LABORATORY DATA:  Metabolic profile remained within normal range.  Magnesium is normal.  CBC is also normal.  Cardiac enzymes are all negative with a CK of 260, MB 2.3, relative index 0.9, troponin 0.06.  EKG reveals normal sinus rhythm, 71 beats per minute, with no ST or T-wave change.  DISCHARGE MEDICATIONS:  1. Vioxx 25 mg 1 p.o. q.d.  2. Norvasc 10 mg 1 p.o. q.d.  3. Prevacid 15 mg 1 p.o. q.d.  4. Enteric-coated aspirin 325 mg 1 p.o. q.d.  5. Paxil 10 mg 1 p.o. q.d.  6. Neurontin 300 mg 1 p.o. t.i.d.  7. Lipitor 10 mg 1 p.o. q.d.  8. Plavix 75 mg 1 p.o. q.d. for one month.  9. Altace 10 mg 1 b.i.d. 10. Vicodin as needed. 11. Stop the Toprol. 12. Stop the Imdur. 13. Use nitroglycerin 0.4 mg sublingual as directed for chest pain.  ACTIVITY:  No strenuous activity, lifting greater than five pounds, driving, or sexual activity for three days.  DIET:  Low salt, low fat  diet.  WOUND CARE:  May gently wash the groin with warm water and soap.  DISCHARGE INSTRUCTIONS:  Call the office at [phone] if any bleeding or  increased size or pain in the groin.  FOLLOW-UP:  Follow up with Dr. Alla German on May 27, 2000, at 2:20 p.m. D:  05/13/00 TD:  05/16/00 Job: [id number]

## 2011-02-26 NOTE — Op Note (Signed)
NAME:  [name], [name]                      ACCOUNT NO.:  [id number]   MEDICAL RECORD NO.:  [id number]                   PATIENT TYPE:  INP   LOCATION:  2899                                 FACILITY:  [facility]   PHYSICIAN:  Elizabeth Sauer, M.D.                   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   POSTOPERATIVE DIAGNOSIS:  Spondylosis with myelopathy at C4-5, C5-6 and C6-  7.   OPERATIONS:  1. C4-5, C5-6, C6-7 anterior cervical diskectomy and fusion with a Tether     plate.  2. Anterior cervical decompression and fusion with a Tether plate.   SURGEON:  Elizabeth Sauer, M.D.   ANESTHESIA:  General endotracheal.   PREPARATION:  Prepped and draped with an alcohol wipe.   COMPLICATIONS:  None.   NURSE ASSISTANT:  [place].   ASSISTANT:  Hosie Spangle, M.D.   INDICATIONS FOR PROCEDURE:  This is a 55 year old gentleman with severe  cervical spondylosis.   DESCRIPTION OF PROCEDURE:  He was taken to the operating room and smoothly  anesthestized and intubated.  Placed supine on the operating room table.  Following shave, prep and drape in the usual sterile fashion, the skin was  incised, starting at approximately one fingerbreadth above the clavicle,  extending 8 cm cephalic, paralleling the sternocleidomastoid muscle.  The  platysma was identified and elevated and finely undermined.  The  sternocleidomastoid was identified.   The area of dissection revealed that the carotid artery tracked laterally.  The left tracheal esophagus was retracted laterally on the right, exposing  the bones of the anterior cervical spine.  A marker was placed.  An  intraoperative x-ray was obtained to confirm correctness of the level.  Having confirmed the correctness of the level, the longus scar was taken  down bilaterally  and the Sheldon self-retaining retractor was placed.  This  allowed exposure of the C4-5, C5-6 and C6-7 disk spaces.   A diskectomy was carried out at these levels under gross observation.  The  operating microscope was then brought in and we used a microdissection  technique to complete the decompression and dissect the nerve roots  bilaterally, removing bone spurs and dividing the posterior longitudinal  ligament.  At all levels there was a significant amount of degenerative  disease.  At C5-6 there was a central disk.  These were all removed without  difficulty and the neural foramen carefully explored and found to be open.  Any bleeding points were controlled with small amounts of thrombin-soaked  Gelfoam which was removed.   Following a complete decompression at all three levels, 7-8 mm bone grafts  were fashioned as our allograft and tapped into place.  The Tether plate was  then placed using  13 mm screws, two in C4, one in C5, and one in C6, and two  in C7.  An intraoperative x-ray showed a good placement of the bone graft  and screws at C4-5 and the top of C5-6 and C6-7 could not be visualized  because of the patient's body habitus.  Having completed the stabilization,  the wound was irrigated and hemostasis assured.   The incisions were reapproximated with #3-0 Vicryl in an interrupted  fashion.  The subcutaneous tissue was reapproximated with #3-0 Vicryl in an  interrupted fashion, and the skin was closed with #4-0 Vicryl in a running  subcuticular fashion.  Benzoin and Steri-Strips were placed and made  occlusive with Telfa and Op-Site.   The patient was placed in an Aspen collar and was returned to the recovery  room in good condition.                                               Elizabeth Sauer, M.D.    MWR/MEDQ  D:  03/26/2003  T:  03/26/2003  Job:  [id number]

## 2011-02-26 NOTE — Op Note (Signed)
[facility]  Patient:    [name], [name]                   MRN: [id number] Proc. Date: 09/06/00 Adm. Date:  [id number] Attending:  Georgina Pillion                           Operative Report  PREOPERATIVE DIAGNOSES:  Left renal calculi - renal stone fragments, status post lithotripsy.  POSTOPERATIVE DIAGNOSES:  Left renal calculi - renal stone fragments, status post lithotripsy.  PROCEDURE PERFORMED:  Cystoscopy, retrograde pyelogram, and left double-J stent placement.  SURGEON:  Rana Snare, M.D.  ANESTHESIA:  General.  INDICATIONS:  [name] is a 55 year old male.  He underwent lithotripsy recently for 1.5 x 7 mm left renal stone.  Fragmentation was incomplete.  He has had some discomfort in attempting to pass fragments.  It appears he will require a secondary procedure and for that reason, we felt we would go ahead and place a double-J stent to facilitate ureteral dilation and to try to prevent occlusion from passage of fragments.  TECHNIQUE AND FINDINGS:  The patient was brought to the operating room where he underwent successful induction of general anesthesia.  He was placed in the lithotomy position, and prepped and draped in the usual manner.  Cystoscopy revealed unremarkable prostate and bladder.  Retrograde pyelogram confirmed some mild dilation of the ureter, presumably from attempts to pass some of the ureteral fragments.  A guidewire was placed through the renal pelvis without difficulty.  A 7 French 24 cm double-J stent was then placed with fluoroscopic as well as visual guidance.  This was done without complications.  The patient was brought to the recovery room in stable condition. DD:  09/06/00 TD:  09/06/00 Job: [id number]

## 2011-02-26 NOTE — Op Note (Signed)
NAME:  [name], [name] NO.:  [id number]   MEDICAL RECORD NO.:  [id number]          PATIENT TYPE:  INP   LOCATION:  4705                         FACILITY:  [facility]   PHYSICIAN:  Champ Mungo. Lovena Le, M.D.  DATE OF BIRTH:  Jun 05, 1956   DATE OF PROCEDURE:  09/15/2005  DATE OF DISCHARGE:                                 OPERATIVE REPORT   PROCEDURE PERFORMED:  Implantation of dual-chamber pacemaker.   INDICATIONS:  Symptomatic bradycardia with positive over 5 seconds.   I. INTRODUCTION:  The patient is A 55 year old man admitted with weakness  and fatigue and chest pain who was subsequently found to have patent grafts  and 3-vessel distal coronary disease with preserved LV function. The  patient, however, was found to have prolonged pauses of over 5 seconds; and  is now referred for pacemaker insertion.   II. PROCEDURE:  After informed consent was obtained, the patient is taken to  the diagnostic catheterization lab in the fasting state. After the usual  preparation and draping, intravenous fentanyl and metaxalone was given for  sedation; 30 cc lidocaine was infiltrated into the left infraclavicular  region. A 5-cm incision was carried out over this region; and electrocautery  utilized to dissect down to the fascial plane. The left subclavian vein was  punctured x2 after it was demonstrated to be patent,  when 10 mL of IV  contrast was injected into the left upper extremity venous system.   The Medtronic model [id number], 58-cm active fixation pacing lead, serial number  [id number] was advanced into the right ventricle and a Medtronic model  5076, 52-cm active fixation pacing lead, serial number [id number] was  advanced to the right atrium. Mapping was carried out in the right  ventricle; and final site on the RV apical septum the R waves measured 20  mV; and the pacing impedance was 947 ohms with a pacing threshold 0.5 volts  at 0.5 milliseconds. Ten volt pacing did not  stimulate the diaphragm.   With the ventricular lead in satisfactory position, attention was then  turned to placement of the atrial lead. It was placed in the right atrial  appendage where P-waves measured 3.8 mV and the pacing impedance was 792  ohms. The pacing threshold was 0.5 volts at 0.5 milliseconds. Again, 10 volt  pacing did not stimulate the diaphragm. With both atrial as well as  ventricular leads in satisfactory position, they were secured to  subpectoralis fascia with a figure-of-eight silk suture. In addition the  sewing sleeve was secured with silk suture. The subcutaneous pocket was then  made with electrocautery. The Medtronic Endotron T-60DR dual-chamber  pacemaker, serial number [id number] was then connected to the atrial and the  ventricular pacing leads and placed back in the subcutaneous pocket. The  generator was secured with silk suture. Additional kanamycin was utilized to  irrigate the pocket. The incision then closed with a layer of 2-0 Vicryl,  followed by a layer of 3-0 Vicryl followed by a layer of 4-0 Vicryl.  Benzoin was painted on the skin.  Steri-Strips were applied, and a pressure  dressing was placed; and the  patient was returned to his room in  satisfactory condition.   III. COMPLICATIONS:  There were no immediate procedure complications.   IV. RESULTS:  This demonstrate successful implantation of this Medtronic  dual-chamber pacemaker in a patient with symptomatic bradycardia, positive  for over 5 seconds.           ______________________________  Champ Mungo. Lovena Le, M.D.     GWT/MEDQ  D:  09/15/2005  T:  09/16/2005  Job:  [id number]   cc:   Octavia Heir, MD  Fax: [phone]

## 2011-02-26 NOTE — Cardiovascular Report (Signed)
NAME:  SHIVANK, BENCE NO.:  [id number]   MEDICAL RECORD NO.:  [id number]          PATIENT TYPE:  INP   LOCATION:  4705                         FACILITY:  [facility]   PHYSICIAN:  Quay Burow, M.D.   DATE OF BIRTH:  [birth year]   DATE OF PROCEDURE:  09/14/2005  DATE OF DISCHARGE:                              CARDIAC CATHETERIZATION   HISTORY OF PRESENT ILLNESS:  Mr. Kauer is a 55 year old gentleman with a  history of CAD status post bypass grafting in 2002.  He has had stenting of  the circumflex and OM branches and had a stent placed in his ramus branch  after graft insertion on December 20, 2004 by Dr. Janene Madeira.  His grafts were  patent at that time.  His other problems include hypertension and  hyperlipidemia as well as obstructive sleep apnea.  He saw Dr. Tami Ribas in  the office yesterday complaining of progressive substernal chest pain and  was admitted for diagnostic coronary arteriography.   DESCRIPTION OF PROCEDURE:  The patient was brought to the second floor [facility] cardiac cath lab in the post-absorptive state.  He was premedicated  with p.o. Valium and IV Versed.  He was right groin was prepped and draped  in the usual sterile fashion.  Then 1% Xylocaine was used for local  anesthesia.  A 6-French sheath was inserted into the right femoral artery  using standard Seldinger technique.  A 6-French right and left Judkins  diagnostic catheter as well as a 6-French pigtail catheter was used for  selective coronary angiography, selective vein graft angiography, selective  IMA angiography, left ventriculography and distal abdominal aortography.  Visipaque dye was used for the entirety of the case.  Aortic, left  ventricular and blood pressures were recorded.   FINDINGS:  1.  Aortic systolic pressure 123456, diastolic pressure 84 and left ventricular      systolic blood pressure 0000000 and diastolic pressure 15.  2.  Coronary arteriography:      1.  Left main:   The left main had a 60-70% ostial stenosis but did not          dampen.      2.  LAD:  90% proximal stenosis ___________ distally.      3.  Ramus intermedius branch:  80-90% segmental proximal stenosis with          comparative flow.      4.  Left circumflex:  Grafts in the mid AV groove circumflex and          possible OM-1 with comparative flow in the distal OM.  3.  Right coronary artery:  Occluded in it's mid portion.  4.  Vein graft to the PDA:  Widely patent.  5.  Vein graft to the ramus:  Widely patent with a patent stent distal to      this.  6.  Vein graft to the obtuse marginal branch:  Widely patent.  7.  LIMA to the LAD:  Widely patent.  8.  Left ventriculography:  RAO ventriculogram was performed using 25 mL of      Visipaque dye at 12  mL/sec.  The overall LVEF was estimated at greater      than 60% without focal wall motion abnormalities.  9.  Distal abdominal aortography:  The distal abdominal aortogram was      performed using 20 mL of Visipaque dye at 20 mL/sec.  The renal arteries      were widely patent.  The inferior abdominal aorta and the bifurcation      appear ___________.   IMPRESSION:  Mr. Likens has widely patent graft as well as a ramus stent.  Does have long pauses and is on beta blockers.  It is possible that his  chest pain is related to his long pauses.  After discussion with Dr. Tami Ribas  it was elected to decrease his beta blocker in half and to follow his rhythm  on telemetry.  If he continues to have long pauses he may need permanent  __________ pacing.   The catheters and sheaths were removed.  Pressure was held on the groin to  achieve hemostasis.  The patient left the operating room in stable  condition.      Quay Burow, M.D.  Electronically Signed     JB/MEDQ  D:  09/14/2005  T:  09/14/2005  Job:  [id number]   cc:   Second floor [facility]   Verdell Carmine, M.D.  Fax: [phone]

## 2011-02-26 NOTE — Op Note (Signed)
NAME:  [name], [name]            ACCOUNT NO.:  [id number]   MEDICAL RECORD NO.:  [id number]          PATIENT TYPE:  AMB   LOCATION:  ENDO                         FACILITY:  [facility]   PHYSICIAN:  Nelwyn Salisbury, M.D.  DATE OF BIRTH:  04-Mar-1956   DATE OF PROCEDURE:  03/02/2006  DATE OF DISCHARGE:                                 OPERATIVE REPORT   PROCEDURE PERFORMED:  Screening colonoscopy.   ENDOSCOPIST:  Nelwyn Salisbury, M.D.   INSTRUMENT USED:  Olympus video colonoscope.   INDICATIONS FOR PROCEDURE:  A 55 year old white male with a history of blood  in the stool and family history of colon cancer underwent a screening  colonoscopy to rule out colonic polyps, masses, etc.   PRE-PROCEDURE PREPARATION:  Informed consent was procured from the patient.  The patient was fasted for four hours prior to the procedure and prepped  with OsmoPrep over the night and in the morning of the procedure.  Risks and  benefits of the procedure, including a 10% missed rate of cancer and polyps,  were discussed with the patient as well.   PRE-PROCEDURE PHYSICAL EXAMINATION:  VITAL SIGNS:  Stable vital signs.  NECK:  Supple.  CHEST:  Clear to auscultation.  HEART:  S1 and S2.  Regular.  ABDOMEN:  Soft with normal bowel sounds.   DESCRIPTION OF PROCEDURE:  The patient was placed in the left lateral  decubitus position and sedated with 100 mcg of fentanyl and 10 mg of Versed  in slow, incremental doses.  Once the patient was adequately sedated,  maintained on low flow oxygen, and continuous cardiac monitoring, the  Olympus video colonoscope was advanced from the rectum to the cecum.  There  was a large amount of residual stool in the colon.  Multiple washes were  done.  The appendicle orifices and ileocecal valve were visualized and  photographed.  Retroflexion in the rectum revealed no abnormalities.  The  patient tolerated the procedure well without complications.   IMPRESSION:  1.  Normal  colonoscopy up to the cecum.  No masses, polyps, or diverticula      seen.  2.  A large amount of residual stool in the colon, multiple washings, and      small lesions could be missed.   RECOMMENDATIONS:  1.  Repeat guaiac testing will be done on an outpatient basis.  2.  Repeat colonoscopy in the next five years unless the patient develops      any abdominal symptoms in the interim, in which case we should do the      procedure at an earlier date.  3.  Outpatient followup as the needs arise in the future.      Nelwyn Salisbury, M.D.  Electronically Signed     JNM/MEDQ  D:  03/02/2006  T:  03/02/2006  Job:  [id number]   cc:   Octavia Heir, MD  [id number]   Verdell Carmine, M.D.  Fax: [phone]

## 2011-02-26 NOTE — H&P (Signed)
[place]  Patient:    [name], [name]                   MRN: [id number] Adm. Date:  [id number] Attending:  Melton Krebs                         History and Physical  ADMITTING DIAGNOSIS: Herniated disk, L4-5 on the left, and L5-S1 on the right.  HISTORY OF PRESENT ILLNESS: This patient is a 55 year old right-handed white gentleman who I operated on six years ago for a 3-4 disk on the right side. He had done well with that and starting last year he had increasing pain in his left lower extremity that goes all the way to the foot.  An MRI was done and he came to me, and he had a L4-5 disk on the left side.  He also had a L5-S1 disk on the right side.  He underwent epidural steroids with some relief but it was not sustained for more than about three weeks and after long consideration it was decided to remove the disk at both L4-5 and L5-S1.  PAST MEDICAL HISTORY: Unremarkable.  PAST SURGICAL HISTORY: He had a shoulder operation 1-1/2 years ago.  MEDICATIONS:  1. Vioxx.  2. Vicodin.  3. Flexeril.  FAMILY HISTORY: Unremarkable.  REVIEW OF SYSTEMS: Unremarkable for bladder dysfunction.  ALLERGIES: No known drug allergies.  PHYSICAL EXAMINATION:  HEENT: Within normal limits.  NECK: He has reasonable range of motion of his neck.  CHEST: Clear.  CARDIAC: Regular rate and rhythm.  ABDOMEN: Nontender, no hepatosplenomegaly.  EXTREMITIES: Without clubbing or cyanosis.  Peripheral pulses good.  GU: Deferred.  NEUROLOGIC: He is awake and alert and oriented.  Cranial nerves intact.  Motor examination shows 5/5 strength throughout the upper and lower extremities save for the left dorsiflexors, which were 5-/5.  He has a left L5 and right S1 sensory deficit.  Reflexes are 1 at the knees, absent at the right ankle, 1 at the left ankle.  Straight leg raising is bilaterally positive.  CLINICAL IMPRESSION: Right L5-S1 and left L4-5  radiculopathy secondary to herniated disk at L5-S1 and L4-5 respectively.  PLAN: The plan is for lumbar laminectomy and diskectomy on the left side at L4-5 and on the right side at L5-S1.  The risks and benefits of this approach have been discussed with him and he wishes to proceed. DD:  02/23/00 TD:  02/23/00 Job: [id number]

## 2011-02-26 NOTE — Discharge Summary (Signed)
[place]  Patient:    [name], [name]                   MRN: [id number] Adm. Date:  [id number] Disc. Date: [id number] Attending:  Melton Krebs                           Discharge Summary  ADMITTING DIAGNOSIS:  Herniated disk, L4-5, L5-S1.  DISCHARGE DIAGNOSIS:  Herniated disk, L4-5, L5-S1.  OPERATIVE PROCEDURES:  Right L5-S1, left L4-5 laminectomy and diskectomy.  SERVICE:  Neurosurgery.  COMPLICATIONS:  None.  DISCHARGE STATUS:  Alive and well.  HISTORY OF PRESENT ILLNESS:  A 55 year old, right-handed white gentleman whose history and physical is recounted in the chart.  He has had a 3-4 diskectomy done in the past.  He has had increasing back and leg pain.  MRI showed a disk at both levels and he has failed conservative therapy.  He is admitted for diskectomy.  PAST MEDICAL HISTORY:  Unremarkable.  PAST SURGICAL HISTORY:  Shoulder operation and lumbar diskectomy.  MEDICATIONS:  Vioxx, Vicodin, and Flexeril.  PHYSICAL EXAMINATION:  GENERAL:  Exam unremarkable.  NEUROLOGIC:  Exam with right S1 and left L5 radiculopathy secondary to disk.  HOSPITAL COURSE:  He was admitted and underwent a right L5-S1 and left L4-5 laminectomy and diskectomy, and postoperatively, he has done well.  He has a little bit of left leg pain.  His strength is full.  Because of the two-level operation, he was a little bit slow to mobilize the first day.  He had trouble getting up and around because of back pain.  He feels much better the second postoperative day.  He is up and ambulating without difficulty.  Strength is full.  Incision is dry.  He is being discharged home on Vicodin.  He is to follow up with me in the [place] office in about eight or nine days for suture removal. DD:  02/25/00 TD:  02/26/00 Job: [id number]

## 2011-02-26 NOTE — Discharge Summary (Signed)
NAME:  SADIKI, LOLLIE NO.:  [id number]   MEDICAL RECORD NO.:  [id number]          PATIENT TYPE:  INP   LOCATION:  4705                         FACILITY:  [facility]   PHYSICIAN:  Jeanella Craze. Little, M.D. DATE OF BIRTH:  [birth year]   DATE OF ADMISSION:  09/13/2005  DATE OF DISCHARGE:  09/16/2005                                 DISCHARGE SUMMARY   Mr. Hutchings is a 55 year old white male who has undergone coronary artery  bypass grafting times four in 2002. He had a Taxus stent to his native ramus  past the SVG graft back in March of 2006. He came into the hospital with  some complaints of chest pain, constant, with or without activity. It goes  to his left shoulder and down his left arm with numbness in his fingers. He  has had cortisone injections and it did not help his shoulder. Thus, he came  to the hospital. It was decided that he should undergo cardiac  catheterization. He was put on IV heparin. He underwent cardiac  catheterization on September 14, 2005, by Dr. Quay Burow. He had a patent  Taxus stent in his native ramus intermedius and his stents in his circumflex  and OM were also patent. His grafts were patent. He did have about a 5  second pause and it was decided that he needed a permanent pacemaker. He was  seen by Dr. Lovena Le in the absence of our implanting physicians and he  underwent implantation of a Vitatron model [id number], serial [id number], DD by  Dr. Lovena Le on September 15, 2005.  It was interrogated on September 16, 2005,  it was within normal functioning. He did have a chest x-ray which was  reviewed by Dr. Lovena Le. Dr. Lovena Le requested that he hold his Plavix for one  week. He was seen by Dr. Rex Kras who also agreed to hold his Plavix. The  risks of holding Plavix were discussed with the patient and wife. He will  call our office if he has any chest pain at all. He was okayed for discharge  on September 16, 2005. His blood pressure is 135/85, pulse 59,  respirations  20, temperature 96.6. He was pacing on demand. His left incisional area  looks like it had a little bit of edema, but no bruising. He was told not to  do any  stretching, pushing, pulling, or lifting of his left arm. He should  keep the incision area clean and dry times give knees having the area washed  with soap and water gently. He will call if he has any increased swelling,  pain, or any oozing to the area. He will hold his medications, hold his  Plavix times one week, aspirin 81 mg one time per day, Altace 10 mg q.12h.,  Lasix 20 mg one time per day, Klonopin 0.5 mg one time per day, Effexor 75  mg one time per daily, trazodone 50 mg one time per day, bedtime metoprolol  25 mg two times per day, and Caduet 10/20 daily. He will follow up with  myself to check his wound on December 14th at 43 a.m.  DISCHARGE DIAGNOSES:  1.  Chest pain, not coronary ischemia related, status post cardiac      catheterization with no progressive coronary artery disease. His stent      to his ramus native vessel was patent and that was placed in March 2006.      His grafts were patent.  2.  Non-coronary artery disease, status post coronary artery bypass graft in      2002.  3.  Normal ejection fraction.  4.  Bradycardia and long pauses, reviewed by Dr. Cristopher Peru, and then      PTVDP placed.  5.  PTVDP placed this admission secondary to bradycardia and long pauses. He      had a Vitatron T60DR placed on September 15, 2005.  6.  Sleep apnea.  7.  Hypertension.  8.  Hyperlipidemia.  9.  Ejection fraction of 60%.  10. Obesity.      Cyndia Bent, N.P.    ______________________________  Jeanella Craze. Little, M.D.    BB/MEDQ  D:  09/16/2005  T:  09/16/2005  Job:  [id number]   cc:   Champ Mungo. Lovena Le, M.D.  [address] [place]  [place] [address]

## 2011-02-26 NOTE — H&P (Signed)
[place]. [facility]  Patient:    [name], [name]                   MRN: [id number] Adm. Date:  04/07/00 Attending:  Melton Krebs Dictator:   Baxter Flattery Jernejcic, P.A.                         History and Physical  DATE OF BIRTH:  [birth year]  ADMISSION DIAGNOSES: 1. Chest pain rule out myocardial infarction. 2. Chronic pain.  CHIEF COMPLAINT:  Chest pain.  HISTORY OF PRESENT ILLNESS:  The patient is a 55 year old white male without history of coronary artery disease who comes to the emergency room with complaints of intermittent substernal chest pain which began around 11:30 this morning.  The patient has a history of chronic back pain, status post laminectomy Feb 23, 2000 and is scheduled for fusion next week.  His pain today radiates to the left arm and he had associated shortness of breath, diaphoresis and nausea.  The pain resolved spontaneously about 30 minutes later but recurred around 1400 today with some more symptoms and persisted until the patient came to the ER.  No prior cardiac workup.  No tobacco abuse. Family history of coronary artery disease.  The patient does not know his cholesterol status.  He has no CHF symptoms and no symptoms of PUD or GERD.  MEDICATIONS:  Vioxx 25 mg, Valium q.6h. p.r.n., Neurontin 300 mg p.o. q.d., Vicodin one to two p.r.n.  Soma 350 mg b.i.d.  ALLERGIES:  NKDA.  PAST MEDICAL HISTORY:  Status post laminectomy Feb 23, 2000.  SOCIAL HISTORY:  The patient is disabled secondary to back pain.  He was a Clinical research associate.  He denies alcohol or tobacco use.  He is married with one daughter.  FAMILY HISTORY:  Father has a history of coronary artery bypass grafting in his 55s.  Mother has had stents placed in May 2001.  REVIEW OF SYSTEMS:  See HPI.  Otherwise unremarkable.  PHYSICAL EXAMINATION:  VITAL SIGNS:  Temperature 98.9, blood pressure 140/96, heart rate 78, respirations 22.  GENERAL:  The  patient is alert and oriented times three.  HEENT:  Normocephalic and atraumatic.  PERRLA.  EOMI.  Oral pharynx benign.  NECK: Supple.  No adenopathy.  No JVD.  No bruits.  CHEST: Clear to auscultation bilaterally.  COR:  Regular rate and rhythm.  Normal S1 and S2.  No murmurs.  No S3.  ABDOMEN:  Soft and nontender.  Nondistended.  Positive bowel sounds.  EXTREMITIES:  Reveal 2+ distal pulses.  Trace edema.  EKG shows normal sinus rhythm.  Rate 74.  Nonspecific T wave changes.  Chest x-ray pending.  LABORATORY DATA:  CK 503.  BUN 15, creatinine 0.9, potassium 3.7.  The patient will be admitted for chest pain, rule out MI. There are no acute EKG changes but the symptoms are consistent with unstable angina.  Will begin proton pump inhibitor.  Also IV heparin, nitroglycerin, aspirin,  Plavix, beta blockers.  Will plan catheterization on April 08, 2000 to define coronary anatomy.  The risks of benefits have been explained and the patient wishes to proceed. DD:  04/11/00 TD:  04/11/00 Job: [id number]

## 2011-02-26 NOTE — Cardiovascular Report (Signed)
. [facility]  Patient:    [name], [name]                   MRN: [id number] Proc. Date: 05/12/00 Adm. Date:  [id number] Attending:  Alla German H                        Cardiac Catheterization  PROCEDURES: 1. Left heart catheterization. 2. Coronary angiography. 3. Left ventriculogram. 4. OM-1 to ostial with placement of intracoronary stent. 5. A-V groove to mid with percutaneous transluminal coronary balloon    angioplasty.  ATTENDING PHYSICIAN:  Alla German, M.D.  COMPLICATIONS:  None.  INDICATIONS:  Mr. Blazejewski is a 55 year old, white male patient of Dr. 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  Additionally, he underwent stenting of his A-V groove circ, as well as the first obtuse marginal.  However, there was 50% residual ostial OM lesion.  The patient was seen in the office today with recurrent chest pain and is now referred for repeat catheterization.  DESCRIPTION OF PROCEDURE:  After getting informed consent, the patient was brought to the cardiac catheterization lab, where his right and left groins were shaved, prepped, and draped in the usual sterile fashion.  ECG monitoring was established.  Using modified Seldinger technique, a #6 French arterial sheath was inserted into the right femoral artery.  A #6 French diagnostic catheter was then used to perform diagnostic angiography.  This revealed a medium sized left main with a 30% ostial tapering.  The LAD was a medium sized vessel which crossed the apex and then merged into one diagonal branch.  The LAD was noted to be diffusely irregular throughout its proximally and mid, as well as distal, portions.  There was a 30% stenosis throughout the  proximal LAD.  The first diagonal was a large vessel which bifurcated distally and had no significant adhesions.  The left coronary artery also gave rise to a medium sized ramus intermedius which has a ______ with up to 30% stenosis in its proximal segments.  The A-V groove circ was a large vessel which gave rise to four obtuse marginal branches.  The A-V groove circ was noted to have a 40% early mid and a stenotic lesion.  There was a stent in the mid A-V groove circ which had no evidence of end stent restenosis.  There was a 20% stenotic lesion just distal to the previously placed stent.  The first and second OMs were small vessels with no significant disease.  The third OM was a large vessel which bifurcated distally and had a 90% ostial lesion.  The fourth OM was a large vessel with no significant disease.  The right coronary artery was a medium sized vessel, which was dominant and gave rise to the PDA, as well as the posterolateral branch.  The RCA was diffusely irregular throughout its proximal, mid, and distal portions with a 40% ostial and 30% distal lesion.  There was a stent in the distal RCA extending into the PDA which has no evidence of significant end stent restenosis.  The PLA had no significant disease.  The left ventriculogram revealed preserved EF at 50%.  There was a significant amount of ectopy, and I was unable to quantitate MR.  HEMODYNAMICS:  Systemic ______ pressure 156/102, LV systemic pressure 156/16, LVEP of 24.  INTERVENTIONAL PROCEDURE:  OM-1 to ostial: Following diagnostic angiography, a #6 French arterial sheath was then exchanged for a #7 [place] arterial sheath, and a #7 [place] and JL 3.5 guiding catheter was ______ engaged in the left coronary ostium.  Selective coronary angiogram was then performed.  Next, a short 0.014 Hi-Torque Floppy guide wire was then advanced ______ into the proximal A-V groove circ.  The guide wire was then positioned in the  distal OM without difficulty.  Next, a Medtronic S7 3.0 x 9 mm stent was then tracked across the ostium of the first OM.  Multiple orthogonal views were then used to position the stent.  The stent was then deployed to a maximum of 14 atmospheres for a total of one minute.  A followup angiogram revealed no evidence of dissection or thrombus with less than 20% residual stenosis, and ______ and A-V groove circ.  We next placed a second 0.014 GT1 guide wire into the A-V groove circ and positioned it into the distal fourth OM without difficulty.  Next, we used a CrossSail 3.5 x 15 mm balloon to cross into the mid segment of the A-V groove circ.  This balloon was then inflated to a maximum of 10 atmospheres for a total of one minute.  Followup angiogram revealed no evidence of dissection or thrombus with ______ to the distal vessel.  Because of the complexity of the case, we elected to proceed with ReoPro administration.  An intravenous dose of heparin was given to maintain the ACT between 2-300.  Final ______ angiograms revealed a less than 20% residual stenosis in the ostium of the first OM with ______ flow to the distal vessels.  There was no evidence of dissection or thrombus.  At this point, we elected to conclude the procedure.  All balloons, wires, and catheters were removed.  A hemostatic sheath was then placed, and the patient was transferred back to the ward in stable condition.  CONCLUSIONS: 1. Successful primary stenting of the first obtuse marginal ostial stenotic    lesion using a Medtronic S7 3.0 x 9 mm coronary stent. 2. Successful percutaneous transluminal coronary balloon angioplasty of the    mid A-V groove circ. 3. Normal LV systolic function. 4. Systemic hypertension. 5. Adjunct use of ReoPro administration. DD:  05/12/00 TD:  05/12/00 Job: [id number]

## 2011-02-26 NOTE — Cardiovascular Report (Signed)
NAMEMarland Kitchen  ERIQ, [name]            ACCOUNT NO.:  [id number]   MEDICAL RECORD NO.:  [id number]          PATIENT TYPE:  INP   LOCATION:  6532                         FACILITY:  [facility]   PHYSICIAN:  Bryson Dames, M.D.DATE OF BIRTH:  13-May-1956   DATE OF PROCEDURE:  12/10/2004  DATE OF DISCHARGE:                              CARDIAC CATHETERIZATION   This is Dr. Bryson Dames dictating a cardiac catheterization and  percutaneous coronary intervention report. The patient's name his [name].  [name]. Medical record number [id number].  Date of procedure was 12/10/2004.   PROCEDURES PERFORMED:  1.  Selective coronary angiography by Judkins technique.  2.  Retrograde left heart catheterization.  3.  Left ventricular angiography.  4.  Selective visualization of the left internal mammary artery graft.  5.  Selective visualization of multiple saphenous vein grafts.  6.  Successful intracoronary artery stenting of the mid intermediate ramus      branch of the left main coronary artery with a 2.5 x 16 mm Taxus stent      placed in the mid vessel.   ENTRY SITE:  Right femoral.   DYE USED:  Omnipaque.   EQUIPMENT:  A 6-French sheath. Judkins right and left diagnostic catheters  for right and left main coronary artery, hockey-stick catheter for saphenous  vein graft to obtuse marginal and hockey-stick for right coronary artery  graft, and left coronary bypass catheter for saphenous vein graft obtuse  marginal branch-1.   PATIENT PROFILE:  The patient is a 55 year old white married male patient of  Dr. Octavia Rangel who underwent coronary artery bypass grafting in 2002.  This will represent the first catheterization he has had since that time.  His current admission was prompted by an office visit to see Dr. Octavia Rangel earlier today; at which time, the patient complained of chest pain  symptoms consistent with angina. His electrocardiogram at that time showed  sinus  rhythm at a rate of 66 a minute, voltage criteria for LVH, T-wave  inversions in V1 and V2, and minor nonspecific ST-T abnormalities in various  leads. There was no frank ischemic change and definitely no injury pattern.   DESCRIPTION OF PROCEDURE:  The patient was brought to the cath lab in a post  absorptive state. He was given Versed and fentanyl before catheterization  started for relief of anxiety and table discomfort. He did not have active  chest pain at the initiation of the catheterization. Infusions of  nitroglycerin and heparin were infusing at the time.   RESULTS:  1.  The left ventricular pressure was 160/15.  2.  End-diastolic pressure 30.  3.  Central aortic pressure was 150/95, mean of 115 by the pullback method.      There was noted to be no significant gradient across the aortic valve.   ANGIOGRAPHIC RESULTS:  1.  The patient had ostial left main stenosis of 50% over short segment. The      mid and distal left main coronary artery was not remarkable.  2.  The left circumflex coronary artery contained a radio-opaque stent  proximally that was old and was patent. The distal vessel was large and      showed no significant lesions.  3.  The left anterior descending coronary artery was 100% occluded just      before the first septal perforator branch and reconstituted distally      through flow from a patent internal mammary artery. See description      below.  4.  The intermediate ramus branch contained a new 75% stenosis in the mid      vessel just beyond the insertion site of a patent saphenous vein graft.      This was a new lesion and felt to be related to the patient's current      acute coronary syndrome. The distal vessel appeared normal.  5.  The right coronary artery was 100% occluded in the midportion of the      vessel with no antegrade flow distally.  6.  The saphenous vein graft to the distal RCA was widely patent throughout      and filled both PDA and  posterolateral branch.  7.  Internal mammary artery to mid-LAD was widely patent throughout and gave      flow distally into the mid and distal LAD and also retrograde into the      mid-LAD almost back to the point of 100% occlusion.   LEFT VENTRICULAR ANGIOGRAPHY:  Showed an ejection fraction of 60% with  normal wall motion and no mitral regurgitation.   PERCUTANEOUS INTERVENTION:  This was accomplished using a 6-French [place]  scientific hockey-stick guide catheter without side holes. The guide wire  used was a [employer] by [employer]. There was no predilatation of  the 75% stenosis in the mid ramus. I direct stented that using 2.5 x 16 mm  Taxus stent by [employer]. I inflated that stent to 17 atmospheres of  pressure on two occasions and I postdilated that stent with a to 3.25  Quantum Maverick balloon inflated to 15 atmospheres of pressure for 45  seconds. The patient did have both ST-segment elevations and severe chest  pain with balloon inflations but the pain resolved within 28-40 seconds  following balloon deflation. There was preservation of TIMI III flow  throughout the case and a lesion of 75% in the midramus. Beyond a patent  saphenous vein graft was reduced to 0% residual with a smooth outer contour  of the stent. No complications occurred. The patient had an ACT of 270  seconds on heparin and was systemically given Integrilin by the double bolus  technique and standard infusion which will be continued for 18 hours.   FINAL DIAGNOSES:  1.  Four-vessel coronary artery disease as described above      1.  50% left main ostial stenosis.      2.  75% proximal ramus stenosis.      3.  100% proximal left anterior descending artery stenosis.      4.  100% proximal right coronary artery stenosis.  2.  Status post coronary artery bypass grafting.      1.  Patent left internal mammary artery to left anterior descending          artery.     2.  Patent saphenous vein  graft to right coronary artery.      3.  Patent saphenous vein graft to left circumflex obtuse marginal          branch #1.      4.  Patent saphenous vein graft to intermediate ramus branch proximal to          a new de novo lesion in the intermediate ramus  3.  New lesion in native midramus taken from 75% to 0 with a new stent which      is a 2.5 x 16 mm Taxus.   End of dictation on [place]. Ericksen. Gamble dictating.  Copies to Dr.  Alla German, one to the Cath Lab at [facility], one to medical records  [facility]. Gamble dictating. Thank you, end.      WHG/MEDQ  D:  12/10/2004  T:  12/11/2004  Job:  [id number]   cc:   Ricardo Heir, MD  [address], [place] [address]  Fax: [facility]

## 2011-02-26 NOTE — Discharge Summary (Signed)
[place]  Patient:    [name], [name]                   MRN: [id number] Adm. Date:  [id number] Disc. Date: 08/18/00 Attending:  Berry, Jonathan Martinique Dictator:   Evern Core. Johny Blamer, R.N., N.P. CC:         Rana Snare, M.D.  Elizabeth Sauer, M.D.  [facility] [address]   Discharge Summary  HISTORY OF PRESENT ILLNESS:  [name] is a 55 year old white married male patient with a prior history of ASCVD.  He came into the hospital with complaints of chest pain with radiation down his left arm, positive shortness of breath, nausea, and no diaphoresis.  He had taken two nitroglycerin.  The second relieved the left arm pain, however, his chest pain was still present. It was reduced from 8 to 2 after three nitroglycerins.  HOSPITAL COURSE:  Thus he was admitted for further cardiac evaluation.  He was put on IV heparin and IV nitroglycerin.  Serial CK-MBs were drawn.  He received Plavix, aspirin, and Integrilin.  He then had a problem with hematuria.  He underwent cardiac catheterization on August 17, 2000, by Dr. Melvern Banker, who found a 40% proximal RCA stenosis, 60% distal RCA stenosis.  A patent stent in his left circumflex and OM1, and a 40% lesion in a small ramus intermedius.   He then had a urology consult for his hematuria and he was seen by Dr. Risa Grill.  A scan was ordered and a cystoscopy.  The CT scan showed left renal calculi.  He will need lithotripsy as an outpatient.  He was considered stable cardiacwise to be discharged home.  His right groin was without hematoma or bruising.  He was taken off his Integrilin and heparin after his cardiac catheterization.  His Plavix was discontinued on the day of discharge.  DISCHARGE LABORATORY DATA:  Urine showed red, cloudy urine, moderate leukocytes.  Microscopic showed a few bacteria.  His INR was 1.1.  His BMET showed a sodium of 138, potassium 3.3, BUN 18, creatinine 1.0.  His CKs  were elevated.  His troponins were not and his MBs were not elevated.  CK was 398 and then 458.  Magnesium was 2.  WBC 7.7, hemoglobin 12.8, hematocrit 34.7, MCV 77, platelets 270.  CK-MB #1 636/3.8, #2 458/2.4, #3 398/2.6.  PTT was 117.  Chest x-ray showed no acute abnormality.  I do not have the results of the CT scan at the time of this dictation.  MEDICATIONS:  1. Enteric-coated aspirin 325 mg once per day.  2. Altace 10 mg twice per day.  3. Neurontin 600 mg two times per day.  4. Norvasc 10 mg once per day.  5. Lipitor 10 mg once per day.  6. Vioxx 50 mg once per day.  7. Niospan 500 mg at bedtime everyday.  8. Protonix 40 mg once per day.  9. Nitroglycerin 1/150 one under the tongue every five minutes x 3 for     chest pain when needed. 10. Imdur 30 mg once per day.  ACTIVITY:  No strenuous activity, no sexual activity, and no driving for four days.  DIET:  Low fat.  If he has any problems with his groin, he will give our office a call.  FOLLOW-UP:  He will follow up with Dr. Tami Ribas on December 14, at 11:15.  He will follow up with Dr. Risa Grill as an outpatient who will contact him  with his appointments.  DISCHARGE DIAGNOSES: 1. Unstable angina. 2. Coronary artery disease, status post cardiac catheterization this admission    with a patent stent to the left circumflex and OM1. 3. Hematuria with renal calculi on CT scan.  Needs lithotripsy. 4. Hypertension. 5. Hyperlipidemia. 6. Hypokalemia in the hospital repleted. 7. Normal ejection fraction of 60%. 8. Back pain apparently needs a spinal fusion. DD:  08/18/00 TD:  08/18/00 Job: [id number]

## 2011-07-02 LAB — URINALYSIS, ROUTINE W REFLEX MICROSCOPIC
Bilirubin Urine: NEGATIVE
Ketones, ur: NEGATIVE
Nitrite: NEGATIVE
Protein, ur: NEGATIVE
pH: 5

## 2011-07-02 LAB — CBC
HCT: 46
HCT: 30.1 — ABNORMAL LOW
HCT: 32 — ABNORMAL LOW
HCT: 37.7 — ABNORMAL LOW
HCT: 38.7 — ABNORMAL LOW
Hemoglobin: 15.8
Hemoglobin: 10.6 — ABNORMAL LOW
Hemoglobin: 13.1
Hemoglobin: 13.4
MCHC: 33.5
MCHC: 34.7
MCHC: 34.8
MCHC: 35.1
MCV: 80.9
MCV: 81.5
MCV: 82.4
Platelets: 298
Platelets: 280
Platelets: 381
RBC: 5.69
RBC: 3.88 — ABNORMAL LOW
RBC: 4.12 — ABNORMAL LOW
RBC: 4.61
RBC: 4.81
RDW: 14.4
RDW: 14.5
WBC: 7
WBC: 12.1 — ABNORMAL HIGH
WBC: 9.2

## 2011-07-02 LAB — COMPREHENSIVE METABOLIC PANEL
Albumin: 4.2
Alkaline Phosphatase: 66
BUN: 12
CO2: 23
Chloride: 107
Creatinine, Ser: 1.1
GFR calc non Af Amer: >60
Glucose, Bld: 108 — ABNORMAL HIGH
Potassium: 4.2
Total Bilirubin: 1

## 2011-07-02 LAB — BASIC METABOLIC PANEL
BUN: 12
CO2: 29
CO2: 31
CO2: 31
Calcium: 8.3 — ABNORMAL LOW
Calcium: 8.5
Chloride: 96
Chloride: 99
Creatinine, Ser: 0.89
Creatinine, Ser: 0.96
GFR calc Af Amer: >60
GFR calc Af Amer: >60
GFR calc Af Amer: >60
GFR calc Af Amer: >60
GFR calc non Af Amer: >60
GFR calc non Af Amer: >60
GFR calc non Af Amer: >60
Glucose, Bld: 102 — ABNORMAL HIGH
Glucose, Bld: 138 — ABNORMAL HIGH
Glucose, Bld: 159 — ABNORMAL HIGH
Potassium: 3.9
Potassium: 4.1
Potassium: 4.2
Sodium: 127 — ABNORMAL LOW
Sodium: 130 — ABNORMAL LOW
Sodium: 132 — ABNORMAL LOW

## 2011-07-02 LAB — CARDIAC PANEL(CRET KIN+CKTOT+MB+TROPI)
CK, MB: 59.3 — ABNORMAL HIGH
Relative Index: 1

## 2011-07-02 LAB — TYPE AND SCREEN

## 2011-07-02 LAB — DIFFERENTIAL
Basophils Absolute: 0.1
Basophils Relative: 1
Eosinophils Relative: 0
Eosinophils Relative: 0
Lymphocytes Relative: 33
Lymphocytes Relative: 10 — ABNORMAL LOW
Lymphocytes Relative: 10 — ABNORMAL LOW
Lymphs Abs: 1.2
Monocytes Absolute: 0.5
Monocytes Absolute: 1.2 — ABNORMAL HIGH
Monocytes Absolute: 1.3 — ABNORMAL HIGH
Monocytes Relative: 11
Monocytes Relative: 9
Neutro Abs: 4
Neutro Abs: 11.1 — ABNORMAL HIGH
Neutrophils Relative %: 57

## 2011-07-02 LAB — ABO/RH: ABO/RH(D): B NEG

## 2011-07-02 LAB — PROTIME-INR: Prothrombin Time: 12.5

## 2011-07-02 LAB — APTT: aPTT: 30

## 2012-11-28 ENCOUNTER — Ambulatory Visit (HOSPITAL_COMMUNITY): Admission: RE | Admit: 2012-11-28 | Payer: Medicare PPO | Source: Ambulatory Visit

## 2012-11-28 ENCOUNTER — Inpatient Hospital Stay (HOSPITAL_COMMUNITY): Payer: Medicare PPO

## 2012-11-28 ENCOUNTER — Inpatient Hospital Stay (HOSPITAL_COMMUNITY)
Admission: AD | Admit: 2012-11-28 | Discharge: 2012-11-30 | DRG: 287 | Disposition: A | Discharge status: Home / Self Care | Payer: Medicare PPO | Source: Ambulatory Visit | Attending: Cardiovascular Disease | Admitting: Cardiovascular Disease

## 2012-11-28 ENCOUNTER — Encounter (HOSPITAL_COMMUNITY): Payer: Self-pay | Admitting: Cardiology

## 2012-11-28 DIAGNOSIS — I25700 Atherosclerosis of coronary artery bypass graft(s), unspecified, with unstable angina pectoris: Secondary | ICD-10-CM | POA: Insufficient documentation

## 2012-11-28 DIAGNOSIS — I2510 Atherosclerotic heart disease of native coronary artery without angina pectoris: Secondary | ICD-10-CM | POA: Diagnosis present

## 2012-11-28 DIAGNOSIS — I200 Unstable angina: Secondary | ICD-10-CM | POA: Diagnosis present

## 2012-11-28 DIAGNOSIS — I2582 Chronic total occlusion of coronary artery: Secondary | ICD-10-CM | POA: Diagnosis present

## 2012-11-28 DIAGNOSIS — I25810 Atherosclerosis of coronary artery bypass graft(s) without angina pectoris: Principal | ICD-10-CM | POA: Diagnosis present

## 2012-11-28 DIAGNOSIS — T82897A Other specified complication of cardiac prosthetic devices, implants and grafts, initial encounter: Secondary | ICD-10-CM | POA: Diagnosis present

## 2012-11-28 DIAGNOSIS — Z87891 Personal history of nicotine dependence: Secondary | ICD-10-CM

## 2012-11-28 DIAGNOSIS — E785 Hyperlipidemia, unspecified: Secondary | ICD-10-CM | POA: Diagnosis present

## 2012-11-28 DIAGNOSIS — G252 Other specified forms of tremor: Secondary | ICD-10-CM | POA: Diagnosis present

## 2012-11-28 DIAGNOSIS — M549 Dorsalgia, unspecified: Secondary | ICD-10-CM | POA: Diagnosis present

## 2012-11-28 DIAGNOSIS — Z79899 Other long term (current) drug therapy: Secondary | ICD-10-CM

## 2012-11-28 DIAGNOSIS — Z9861 Coronary angioplasty status: Secondary | ICD-10-CM | POA: Diagnosis present

## 2012-11-28 DIAGNOSIS — G250 Essential tremor: Secondary | ICD-10-CM | POA: Diagnosis present

## 2012-11-28 DIAGNOSIS — Y849 Medical procedure, unspecified as the cause of abnormal reaction of the patient, or of later complication, without mention of misadventure at the time of the procedure: Secondary | ICD-10-CM | POA: Diagnosis present

## 2012-11-28 DIAGNOSIS — Z950 Presence of cardiac pacemaker: Secondary | ICD-10-CM | POA: Diagnosis present

## 2012-11-28 DIAGNOSIS — I214 Non-ST elevation (NSTEMI) myocardial infarction: Secondary | ICD-10-CM | POA: Diagnosis present

## 2012-11-28 DIAGNOSIS — Z9989 Dependence on other enabling machines and devices: Secondary | ICD-10-CM | POA: Diagnosis present

## 2012-11-28 DIAGNOSIS — G4733 Obstructive sleep apnea (adult) (pediatric): Secondary | ICD-10-CM | POA: Diagnosis present

## 2012-11-28 DIAGNOSIS — G8929 Other chronic pain: Secondary | ICD-10-CM | POA: Diagnosis present

## 2012-11-28 DIAGNOSIS — I10 Essential (primary) hypertension: Secondary | ICD-10-CM | POA: Diagnosis present

## 2012-11-28 HISTORY — DX: Hyperlipidemia, unspecified: E78.5

## 2012-11-28 HISTORY — DX: Angina pectoris, unspecified: I20.9

## 2012-11-28 HISTORY — DX: Essential (primary) hypertension: I10

## 2012-11-28 HISTORY — DX: Atherosclerosis of coronary artery bypass graft(s) without angina pectoris: I25.810

## 2012-11-28 HISTORY — DX: Essential tremor: G25.0

## 2012-11-28 HISTORY — DX: Dependence on other enabling machines and devices: Z99.89

## 2012-11-28 HISTORY — DX: Atherosclerotic heart disease of native coronary artery without angina pectoris: I25.10

## 2012-11-28 HISTORY — DX: Obstructive sleep apnea (adult) (pediatric): G47.33

## 2012-11-28 LAB — CBC
HCT: 45.9 % (ref 39.0–52.0)
Hemoglobin: 16.1 g/dL (ref 13.0–17.0)
MCH: 28.4 pg (ref 26.0–34.0)
MCHC: 35.1 g/dL (ref 30.0–36.0)

## 2012-11-28 LAB — MRSA PCR SCREENING: MRSA by PCR: NEGATIVE

## 2012-11-28 MED ORDER — TRAZODONE HCL 100 MG PO TABS
100.0000 mg | ORAL_TABLET | Freq: Every day | ORAL | Status: DC
  Administered 2012-11-28 – 2012-11-29 (×2): 100 mg via ORAL
  Filled 2012-11-28 (×5): qty 1

## 2012-11-28 MED ORDER — SODIUM CHLORIDE 0.9 % IV SOLN
INTRAVENOUS | Status: DC
  Administered 2012-11-28: 1000 mL via INTRAVENOUS

## 2012-11-28 MED ORDER — ASPIRIN EC 81 MG PO TBEC
81.0000 mg | DELAYED_RELEASE_TABLET | Freq: Every day | ORAL | Status: DC
  Filled 2012-11-28: qty 1

## 2012-11-28 MED ORDER — HEPARIN (PORCINE) IN NACL 100-0.45 UNIT/ML-% IJ SOLN
1700.0000 [IU]/h | INTRAMUSCULAR | Status: DC
  Administered 2012-11-28: 1300 [IU]/h via INTRAVENOUS
  Administered 2012-11-29 (×2): 1700 [IU]/h via INTRAVENOUS
  Filled 2012-11-28 (×4): qty 250

## 2012-11-28 MED ORDER — ACETAMINOPHEN 325 MG PO TABS
650.0000 mg | ORAL_TABLET | ORAL | Status: DC | PRN
  Administered 2012-11-29: 650 mg via ORAL

## 2012-11-28 MED ORDER — ATORVASTATIN CALCIUM 20 MG PO TABS
20.0000 mg | ORAL_TABLET | Freq: Every day | ORAL | Status: DC
  Filled 2012-11-28 (×3): qty 1

## 2012-11-28 MED ORDER — ASPIRIN 81 MG PO CHEW
324.0000 mg | CHEWABLE_TABLET | ORAL | Status: AC
  Administered 2012-11-29: 324 mg via ORAL

## 2012-11-28 MED ORDER — NITROGLYCERIN 2 % TD OINT
0.5000 [in_us] | TOPICAL_OINTMENT | Freq: Four times a day (QID) | TRANSDERMAL | Status: DC
Start: 2012-11-28 — End: 2012-11-29
  Administered 2012-11-29 (×2): 0.5 [in_us] via TOPICAL
  Filled 2012-11-28: qty 30

## 2012-11-28 MED ORDER — ASPIRIN 300 MG RE SUPP
300.0000 mg | RECTAL | Status: AC
  Filled 2012-11-28: qty 1

## 2012-11-28 MED ORDER — PANTOPRAZOLE SODIUM 40 MG PO TBEC
40.0000 mg | DELAYED_RELEASE_TABLET | Freq: Every day | ORAL | Status: DC
  Administered 2012-11-29 – 2012-11-30 (×2): 40 mg via ORAL
  Filled 2012-11-28 (×2): qty 1

## 2012-11-28 MED ORDER — CLONAZEPAM 1 MG PO TABS
1.0000 mg | ORAL_TABLET | Freq: Every day | ORAL | Status: DC
  Administered 2012-11-28 – 2012-11-29 (×2): 1 mg via ORAL
  Filled 2012-11-28: qty 1
  Filled 2012-11-28: qty 2

## 2012-11-28 MED ORDER — HEPARIN BOLUS VIA INFUSION
4000.0000 [IU] | Freq: Once | INTRAVENOUS | Status: AC
  Administered 2012-11-28: 4000 [IU] via INTRAVENOUS
  Filled 2012-11-28: qty 4000

## 2012-11-28 MED ORDER — LISINOPRIL 10 MG PO TABS
10.0000 mg | ORAL_TABLET | Freq: Every day | ORAL | Status: DC
  Administered 2012-11-28 – 2012-11-30 (×3): 10 mg via ORAL
  Filled 2012-11-28 (×3): qty 1

## 2012-11-28 MED ORDER — PROPRANOLOL HCL 20 MG PO TABS
20.0000 mg | ORAL_TABLET | Freq: Two times a day (BID) | ORAL | Status: DC
  Administered 2012-11-28 – 2012-11-30 (×4): 20 mg via ORAL
  Filled 2012-11-28 (×6): qty 1

## 2012-11-28 MED ORDER — HYDROCODONE-ACETAMINOPHEN 10-325 MG PO TABS
1.0000 | ORAL_TABLET | ORAL | Status: DC | PRN
  Administered 2012-11-28: 1 via ORAL
  Filled 2012-11-28: qty 1

## 2012-11-28 MED ORDER — ONDANSETRON HCL 4 MG/2ML IJ SOLN: 4.0000 mg | Freq: Four times a day (QID) | INTRAMUSCULAR | Status: DC | PRN

## 2012-11-28 MED ORDER — NITROGLYCERIN 0.4 MG SL SUBL
0.4000 mg | SUBLINGUAL_TABLET | SUBLINGUAL | Status: DC | PRN
  Administered 2012-11-28: 0.4 mg via SUBLINGUAL

## 2012-11-28 MED ORDER — NITROGLYCERIN 0.4 MG SL SUBL
SUBLINGUAL_TABLET | SUBLINGUAL | Status: AC
  Administered 2012-11-28: 0.4 mg via SUBLINGUAL
  Filled 2012-11-28: qty 25

## 2012-11-28 NOTE — Progress Notes (Signed)
RT Note: CPAP set-up with full face mask @ 8cmH2o per pt comfort. Pt states he has the same machine at home and will place himself on when he is ready. Mask fitted and H2O filled. Pt on RA at this time SPO2 99%.

## 2012-11-28 NOTE — Progress Notes (Signed)
ANTICOAGULATION CONSULT NOTE - Initial Consult  Pharmacy Consult for Heparin Indication: chest pain/ACS  Allergies  Allergen Reactions  . Crestor (Rosuvastatin)     Muscle aches  . Lipitor (Atorvastatin)     ? cough  . Morphine And Related   . Zocor (Simvastatin)     Muscle aches    Patient Measurements: Height: 5\' 10"  (177.8 cm) Weight: 264 lb 1.8 oz (119.8 kg) IBW/kg (Calculated) : 73 Heparin Dosing Weight: 99 kg  Vital Signs:    Labs:  Recent Labs  11/28/12 1830  HGB 16.1  HCT 45.9  PLT 268    Estimated Creatinine Clearance: 93.5 ml/min (by C-G formula based on Cr of 1.13).   Medical History: Past Medical History  Diagnosis Date  . Coronary artery disease   . Anginal pain   . CAD (coronary artery disease), with CABG 2002 LIMA-LAD, VG-ramus intermediate, SVG-OM, SVG-PDA 11/28/2012  . CAD (coronary artery disease) of artery bypass graft, with stent to VG-OM-(BMS) 11/28/2012  . OSA on CPAP 11/28/2012  . 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 Tremor, essential, treated with propranolol 11/28/2012  . HTN (hypertension) 11/28/2012  . Dyslipidemia, with intolerance to Crestor and zocor and possible cough with Lipitor 11/28/2012  . Back pain, chronic 11/28/2012    Medications:  No anticoagulants PTA  Assessment: [name] is a 36 YOM presenting with CP to begin heparin per pharmacy. His CBC is WNL. Heparin dosing weight is 99kg. Noted plans for cath tomorrow.   Goal of Therapy:  Heparin level 0.3-0.7 units/ml Monitor platelets by anticoagulation protocol: Yes   Plan:  Heparin 4000 units bolus then begin heparin drip at 1300 units/hr  F/u with 6 hour HL, timed collect Daily HL and CBC  Thank you,  Francesca Jewett, PharmD, BCPS 11/28/2012 7:09 PM

## 2012-11-28 NOTE — Progress Notes (Signed)
Discussed consent for Cath/PCI procedure, pt & wife reported that "they were told they would get a cath,but have not seen a MD in the hospital to explain procedure.". Unable to get consent signed.

## 2012-11-28 NOTE — H&P (Signed)
[name] is an 57 y.o. male.    Cardiologist: Dr. Sallyanne Kuster  Chief Complaint: chest pain, similar to pain prior to stent  HPI: 57 year old WMM with history of CABG in 2002 and then stent to VG-OM in 2011 with BMS, presents to the office today with episodic chest pain Lt. Ant chest, somewhat of a tingling pain. First episode 2 months ago, now episodes have increased to more than once daily.  Last several minutes.  He is currently out of NTG but 2 months ago he did take and the NTG did relieve the pain.  His pain occurs with rest or exertion.  He is unable to be very active due to back pain.  Mild SOB associated with the pain/discomfort.  No nausea or diaphoresis.   He had 2 episodes in the office.  EKG without acute changes.  Previously Nuc. Studies have not given accurate results for this patient.  Last Echo 05/2010 with EF >55%, mild LVH, mild diastolic dysfunction. Last Nuc 2001.  Permanent pacemaker placed by Dr. Lovena Le in 2006- Medtronic device.  Evaluated here in the office without any issues and no arrhythmias.   Past Medical History  Diagnosis Date  . Coronary artery disease   . Anginal pain   . CAD (coronary artery disease), with CABG 2002 LIMA-LAD, VG-ramus intermediate, SVG-OM, SVG-PDA 11/28/2012  . CAD (coronary artery disease) of artery bypass graft, with stent to VG-OM-(BMS) 11/28/2012  . OSA on CPAP 11/28/2012  . 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 Tremor, essential, treated with propranolol 11/28/2012  . HTN (hypertension) 11/28/2012  . Dyslipidemia, with intolerance to Crestor and zocor and possible cough with Lipitor 11/28/2012  . Back pain, chronic 11/28/2012    Past Surgical History  Procedure Laterality Date  . Coronary artery bypass graft    . Cardiac catheterization    . Coronary angioplasty    . Insert / replace / remove pacemaker      History reviewed. No pertinent family history. not significant to this  admit  Social History:  reports that he has quit smoking. He does not have any smokeless tobacco history on file. He reports that he does not use illicit drugs. His alcohol history is not on file. married.  Allergies: Morphine Intolerance to zocor, crestor, Imdur causes H/A.  Outpatient Medications: Trazodone 100 mg daily at HS KLONOPIN 1 mg every HS Vicodin, prn seroquel prn, has not taken recently propranolol 20 mg BID Lisinopril 10 mg daily  No results found for this or any previous visit (from the past 48 hour(s)). No results found.Labs Pending  ROS: General:no colds or fevers, no weight changes Skin:no rashes or ulcers HEENT:no blurred vision, no congestion CV:see HPI PUL:see HPI GI:no diarrhea constipation or melena, mild  indigestion GU:no hematuria, no dysuria MS:no joint pain, no claudication, + back and neck pain with hx of back surgery Neuro:no syncope, + lightheadedness Endo:no diabetes, no thyroid disease  BP 150/90 P 87  Wt 267.5 Ht 5'10" BMI  37 PE: General:alert, no severe distress but anxious concerning recurring chest pain, pleasant affect Skin:warm and dry, brisk capillary refill  HEENT:normocephalic, sclera clear Neck:supple, no JVD, No carotid bruits Heart:S1S2 RRR without murmur gallup rub or click Lungs:clear without rales, rhonchi or wheezes Abd:+ BS, soft, non tender, do not palpate liver spleen or masses Ext:no edema, 1+ post tib Neuro:alert and oriented X 3 MAE, follows commands    Assessment/Plan Principal Problem:   Unstable angina  Active Problems:   CAD (coronary artery disease) of artery bypass graft, with stent to VG-OM-(BMS)   CAD (coronary artery disease), with CABG 2002 LIMA-LAD, VG-ramus intermediate, SVG-OM, SVG-PDA   OSA on CPAP   Cardiac pacemaker, 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 Of migraines with NTG.  Plan for cardiac cath in AM with Dr. Ellyn Hack.  He has progressive unstable angina.  Check serial cardiac enzymes.  I have placed on Lipitor- he had a cough with this in past.  Had been on pravastatin but has stopped.  He also stopped ASA due to bruising.  PPM stable on office interrogation. Dr. Gwenlyn Found has seen and examined the pt as well.  INGOLD,LAURA R 11/28/2012, 5:49 PM    Agree with note written by Cecilie Kicks RNP  Pt with known CAD s/p CABG in the past and subsequent intervention now with accelerated angina and symptoms similar to previous pre intervention symptoms. Had recurrent CP today but currently pain free. Plan to admit to step down, IV hep and NTG. Dr. Ellyn Hack top perform cardiac cath tomorrow, Pt agreeable.  Lorretta Harp 11/28/2012 8:10 PM

## 2012-11-29 ENCOUNTER — Other Ambulatory Visit: Payer: Self-pay

## 2012-11-29 ENCOUNTER — Encounter (HOSPITAL_COMMUNITY)
Admission: AD | Disposition: A | Discharge status: Home / Self Care | Payer: Self-pay | Source: Ambulatory Visit | Attending: Cardiovascular Disease

## 2012-11-29 ENCOUNTER — Ambulatory Visit (HOSPITAL_COMMUNITY): Admission: RE | Admit: 2012-11-29 | Payer: Medicare PPO | Source: Ambulatory Visit | Admitting: Cardiology

## 2012-11-29 HISTORY — PX: LEFT HEART CATHETERIZATION WITH CORONARY ANGIOGRAM: SHX5451

## 2012-11-29 LAB — CBC
MCH: 27.9 pg (ref 26.0–34.0)
MCHC: 34.5 g/dL (ref 30.0–36.0)
MCV: 81 fL (ref 78.0–100.0)
Platelets: 233 10*3/uL (ref 150–400)

## 2012-11-29 LAB — COMPREHENSIVE METABOLIC PANEL
ALT: 16 U/L (ref 0–53)
CO2: 26 mEq/L (ref 19–32)
Calcium: 9.7 mg/dL (ref 8.4–10.5)
Chloride: 101 mEq/L (ref 96–112)
Creatinine, Ser: 1.3 mg/dL (ref 0.50–1.35)
GFR calc Af Amer: 69 mL/min — ABNORMAL LOW (ref >90)
GFR calc non Af Amer: 59 mL/min — ABNORMAL LOW (ref >90)
Glucose, Bld: 99 mg/dL (ref 70–99)
Total Bilirubin: 1.4 mg/dL — ABNORMAL HIGH (ref 0.3–1.2)

## 2012-11-29 LAB — HEMOGLOBIN A1C
Hgb A1c MFr Bld: 6.3 % — ABNORMAL HIGH (ref <5.7)
Mean Plasma Glucose: 134 mg/dL — ABNORMAL HIGH (ref <117)

## 2012-11-29 LAB — HEPARIN LEVEL (UNFRACTIONATED): Heparin Unfractionated: 0.28 IU/mL — ABNORMAL LOW (ref 0.30–0.70)

## 2012-11-29 SURGERY — LEFT HEART CATHETERIZATION WITH CORONARY ANGIOGRAM
Anesthesia: LOCAL

## 2012-11-29 MED ORDER — SODIUM CHLORIDE 0.9 % IV SOLN
1.0000 mL/kg/h | INTRAVENOUS | Status: AC
  Administered 2012-11-29 (×2): 1 mL/kg/h via INTRAVENOUS

## 2012-11-29 MED ORDER — SODIUM CHLORIDE 0.9 % IJ SOLN: 3.0000 mL | Freq: Two times a day (BID) | INTRAMUSCULAR | Status: DC

## 2012-11-29 MED ORDER — HEPARIN SODIUM (PORCINE) 1000 UNIT/ML IJ SOLN
INTRAMUSCULAR | Status: AC
  Filled 2012-11-29: qty 1

## 2012-11-29 MED ORDER — LIDOCAINE HCL (PF) 1 % IJ SOLN
INTRAMUSCULAR | Status: AC
  Filled 2012-11-29: qty 30

## 2012-11-29 MED ORDER — ASPIRIN 81 MG PO CHEW
CHEWABLE_TABLET | ORAL | Status: AC
  Administered 2012-11-29: 324 mg via ORAL
  Filled 2012-11-29: qty 4

## 2012-11-29 MED ORDER — ACETAMINOPHEN 325 MG PO TABS
ORAL_TABLET | ORAL | Status: AC
  Filled 2012-11-29: qty 2

## 2012-11-29 MED ORDER — ASPIRIN EC 81 MG PO TBEC
81.0000 mg | DELAYED_RELEASE_TABLET | Freq: Every day | ORAL | Status: DC
  Administered 2012-11-30: 81 mg via ORAL
  Filled 2012-11-29: qty 1

## 2012-11-29 MED ORDER — CLOPIDOGREL BISULFATE 75 MG PO TABS
75.0000 mg | ORAL_TABLET | Freq: Every day | ORAL | Status: DC
  Administered 2012-11-30: 75 mg via ORAL
  Filled 2012-11-29 (×2): qty 1

## 2012-11-29 MED ORDER — SODIUM CHLORIDE 0.9 % IJ SOLN: 3.0000 mL | INTRAMUSCULAR | Status: DC | PRN

## 2012-11-29 MED ORDER — NITROGLYCERIN 1 MG/10 ML FOR IR/CATH LAB
INTRA_ARTERIAL | Status: AC
  Filled 2012-11-29: qty 10

## 2012-11-29 MED ORDER — ALPRAZOLAM 0.25 MG PO TABS: 0.2500 mg | ORAL_TABLET | Freq: Three times a day (TID) | ORAL | Status: DC | PRN

## 2012-11-29 MED ORDER — ASPIRIN 81 MG PO CHEW
324.0000 mg | CHEWABLE_TABLET | ORAL | Status: AC
  Administered 2012-11-29: 324 mg via ORAL
  Filled 2012-11-29: qty 4

## 2012-11-29 MED ORDER — HEPARIN (PORCINE) IN NACL 2-0.9 UNIT/ML-% IJ SOLN
INTRAMUSCULAR | Status: AC
  Filled 2012-11-29: qty 1000

## 2012-11-29 MED ORDER — DIAZEPAM 5 MG PO TABS
5.0000 mg | ORAL_TABLET | ORAL | Status: AC
  Administered 2012-11-29: 5 mg via ORAL
  Filled 2012-11-29: qty 1

## 2012-11-29 MED ORDER — SODIUM CHLORIDE 0.9 % IJ SOLN
3.0000 mL | Freq: Two times a day (BID) | INTRAMUSCULAR | Status: DC
  Administered 2012-11-29: 3 mL via INTRAVENOUS

## 2012-11-29 MED ORDER — MIDAZOLAM HCL 2 MG/2ML IJ SOLN
INTRAMUSCULAR | Status: AC
  Filled 2012-11-29: qty 2

## 2012-11-29 MED ORDER — ZOLPIDEM TARTRATE 5 MG PO TABS: 5.0000 mg | ORAL_TABLET | Freq: Every evening | ORAL | Status: DC | PRN

## 2012-11-29 MED ORDER — ISOSORBIDE MONONITRATE ER 30 MG PO TB24
30.0000 mg | ORAL_TABLET | Freq: Every day | ORAL | Status: DC
  Administered 2012-11-29: 30 mg via ORAL
  Filled 2012-11-29: qty 1

## 2012-11-29 MED ORDER — VERAPAMIL HCL 2.5 MG/ML IV SOLN
INTRAVENOUS | Status: AC
  Filled 2012-11-29: qty 2

## 2012-11-29 MED ORDER — HEPARIN BOLUS VIA INFUSION
3000.0000 [IU] | Freq: Once | INTRAVENOUS | Status: AC
  Administered 2012-11-29: 3000 [IU] via INTRAVENOUS
  Filled 2012-11-29: qty 3000

## 2012-11-29 MED ORDER — SODIUM CHLORIDE 0.9 % IV SOLN
250.0000 mL | INTRAVENOUS | Status: DC
  Administered 2012-11-29: 1000 mL via INTRAVENOUS

## 2012-11-29 MED ORDER — FENTANYL CITRATE 0.05 MG/ML IJ SOLN
INTRAMUSCULAR | Status: AC
  Filled 2012-11-29: qty 2

## 2012-11-29 MED ORDER — SODIUM CHLORIDE 0.9 % IV SOLN: 250.0000 mL | INTRAVENOUS | Status: DC | PRN

## 2012-11-29 NOTE — CV Procedure (Signed)
[facility] CARDIAC CATHETERIZATION REPORT  NAME:  [name]   MRN: [id number] DOB:  03/09/1956   ADMIT DATE: 11/28/2012 Procedure Date: 11/29/2012   INTERVENTIONAL CARDIOLOGIST: Leonie Man, M.D., MS PRIMARY CARE PROVIDER: Ward Givens PRIMARY CARDIOLOGIST: Sanda Klein, MD; Leonie Man, M.D., MS   PATIENT:  [name] is a 57 y.o. male a past medical history of coronary disease status post CABG in in 2002 with a LIMA-LAD, SVG-Ramus Intermedius, SVG-OM, SVG-r PDA, with previous PCI as to the native vessels in the past.  In November 2011 he underwent staged PCI of both the vein grafts to the PDA (Integrity BMS 3.0 mm 13 mm postdilated to 3.5 mm) and OM (Veriflex 4.0 mm 32 mm postdilated to 4.5 mm) for unstable angina.  He also is status post permanent pacer placement (Medtronic) in 2006. Roughly 1 to 2 months ago he had a prolonged episode of chest discomfort that lasted 10-15 minutes. Since then he is in having intermittent episodes of substernal chest pain with mild short of breath worse with exertion. He finally presented to the emergency room yesterday in the episode again to be consistently worse. Due to his known cardiac history, he is referred for diagnostic cardiac catheterization.  PRE-OPERATIVE DIAGNOSIS:    Progressive Angina  History of CABG -- PCI to Native and Grafts  PROCEDURES PERFORMED:    Left Heart Catheterization with Native and Graft Angiography  Left Ventriculography  PROCEDURE:Consent:  Risks of procedure as well as the alternatives and risks of each were explained to the (patient/caregiver).  Consent for procedure obtained. Consent for signed by MD and patient with RN witness -- placed on chart.   PROCEDURE: The patient was brought to the 2nd [facility] in the fasting state and prepped and draped in the usual sterile fashion for Left wrist access -- after modified Allen's test  with plethysmography demonstrated excellent Ulnar Artery collateral flow for radial access. Sterile technique was used including antiseptics.  Skin prep: Chlorhexidine.  Time Out: Verified patient identification, verified procedure, site/side was marked, verified correct patient position, special equipment/implants available, medications/allergies/relevent history reviewed, required imaging and test results available.  Performed  Access: Left Radial Artery; 5 Fr Sheath; Seldinger's technique using Agiocath Micropuncture Kit. Diagnostic:  TIG 4.0 advanced and all catheter exchanges over  long-exchange safety J-wire  SVG-Ramus & SVG-OM Angiography: TIG 4.0  Left Coronary Artery Angiography: EBU 3.5  Right Coronary Artery Angiography: JR4  SVG-RCA Angiography & LV Hemodynamics (LV Gram - Hand Injection): MPA2  LIMA Angiography: IMA  TR Band:  1330 Hours, 15 mL air  MEDICATIONS:  ANESTHESIA:   Local Lidocaine 2 ml  SEDATION:  2 mg IV Versed, 100 mcg IV fentanyl ; Premedication: 5 mg PO Valium  Omnipaque Contrast: 175 ml  Anticoagulation: IV Heparin 5000 Units  Hemodynamics:  Central Aortic / Mean Pressures: 136/94 mmHg; 115 mmHg  Left Ventricular Pressures / EDP: 132/13 mmHg; 21 mmHg  Left Ventriculography:  EF: ~45 %  Wall Motion: Mild inferolateral hypokinesis.  Coronary Anatomy:  Left Main:  large-caliber vessel that trifurcates into a bifurcating Ramus Intermedius, LAD and Circumflex.  This calcification but no significant lesion LAD: Is a proximal 95% stenosis followed by diffuse disease at which point the vessel is totally occluded in the midportion.  Left Circumflex: Moderate large-caliber vessel that has significant ostial disease is a roughly 70-80%, vessel and courses down as AV groove circumflex with an occluded OM 1 and likely  OM 2 as there is what appears to be a stent in OM1. The previously grafted OM 2 has no antegrade flow  There is left to left collaterals  from the LAD and ramus intermedius system filling the distal portion of the large lateral OM that was previously grafted.  Ramus Intermedius: Moderate caliber vessel that has an ostial/proximal 90% stenosis before the vessel bifurcates into a more anterolateral/diagonal branch and a more lateral/inferolateral OM branch. There is retrograde flow to the vein graft indicating widely patent vein graft. There is diffuse disease leading up to the insertion site of the graft, with to and fro flow downstream from the graft insertion.   RCA: Small caliber vessel with diffuse proximal and disease is 100% occluded in the midportion.  RPDA: Fills via vein graft with minimal luminal irregularities distally.  RPL Sysytem:The Right Posterior AV Groove Branch fills via retrograde flow from the vein graft into the PDA  Graft Anatomy: LIMA-LAD: Widely patent graft, takes off from the distal subclavian artery and takes a tortuous route to the mid LAD. Antegrade flow down the distal LAD shows a tubular 40-50 % stenosis, there is unlikely physiologically significant SVG-Ramus Intermedius: Widely patent graft with brisk TIMI 3 flow to the distal ramus that retrograde fills the anterolateral branch through a proximal 70-80% lesion. SVG-OM: 95% stenosis prior to stent followed by 100% total occlusion/in-stent stenosis (thrombosis).  SVG-RCA: 40% stenosis prior to the stent is widely patent. The graft inserts on the proximal RPDA. There is diffuse luminal irregularities but no significant stenosis beyond the stent.  EBL:   < 5 ml  PATIENT DISPOSITION:    The patient was transferred to the PACU holding area in a hemodynamicaly stable, chest pain free condition.  The patient tolerated the procedure well, and there were no complications.  The patient was stable before, during, and after the procedure.  POST-OPERATIVE DIAGNOSIS:    Severe native CAD as described.  The likely culprit lesion for the patient's chest  discomfort several months ago as well as ongoing chest pain is the likely recently occluded SVG-OM and inadequate collateral flow.  Moderate pre-stent stenosis of the SVG-rPDA  Mildly reduced Ejection Fraction with expected inferolateral hypokinesis  PLAN OF CARE:  Standard post radial cath care. The patient was monitored overnight for medication titration and likely discharge in the morning.  With no potential culprit lesion for PCI, the only available option is titration medical therapy.  He has a listed aspirin allergy that may only be due to bruising. If possible we will restart aspirin if not we'll use generic clopidogrel.  Discontinue nitroglycerin glycerin paste and start Imdur 30 mg daily.  Moderate pressures overnight and anticipate potentially uptitrating into the ACE inhibitor or beta blocker.  He will followup with either myself / Dr. Sallyanne Kuster for a Physician Extender/Advanced Practitioner in 1-2 weeks post discharge.   Leonie Man, M.D., M.S. [facility] [address] [place], [place]  [address]  [phone]  11/29/2012 2:12 PM

## 2012-11-29 NOTE — Interval H&P Note (Signed)
History and Physical Interval Note:  11/29/2012 9:31 AM  [name]  has presented today for surgery, with the diagnosis of Chest pain - AND Known CAD-CABG, s/p PCI to RI, SVG-RCA & SVG-OM.  The various methods of treatment have been discussed with the patient and family. After consideration of risks, benefits and other options for treatment, the patient has consented to  Procedure(s): LEFT HEART CATHETERIZATION WITH CORONARY ANGIOGRAM (N/A) with possible PERCUTANEOUS CORONARY INTERVENTION (BALLOON/STENT) as a surgical intervention .    The patient's history has been reviewed, patient examined, no change in status, stable for surgery.  I have reviewed the patient's chart and labs.  Questions were answered to the patient's satisfaction.    Leonie Man, M.D., M.S. [facility] [address] [place], [place]  [address]  [phone] Pager # [phone] 11/29/2012 9:31 AM

## 2012-11-29 NOTE — Progress Notes (Signed)
ANTICOAGULATION CONSULT NOTE - Follow Up Consult  Pharmacy Consult for heparin Indication: chest pain/ACS   Labs:  Recent Labs  11/28/12 1830 11/29/12 0310  HGB 16.1 15.0  HCT 45.9 43.5  PLT 268 233  HEPARINUNFRC  --  0.11*    Assessment: 57yo male subtherapeutic on heparin with initial dosing for CP.  Goal of Therapy:  Heparin level 0.3-0.7 units/ml   Plan:  Will rebolus with heparin 3000 units and increase gtt by 4 units/kg/hr to 1700 units/hr and check level in 6hr.  Rogue Bussing PharmD BCPS 11/29/2012,4:15 AM

## 2012-11-29 NOTE — Progress Notes (Signed)
Labs including CMP and serial troponin I's were ordered on this pt and in computer it is noted they were cancelled.  I did not cancel.  Pt admitted to rule out MI with unstable angina. I have asked for safety portal as to why labs not done.

## 2012-11-29 NOTE — Progress Notes (Signed)
Heparin level turned off for cath lab.

## 2012-11-29 NOTE — Progress Notes (Signed)
Safety zone portal completed. Lab cancelled troponion order x 3. Lab did not notify me of cancellation in order. I was unaware of the Troponin order cancelled by LAB.

## 2012-11-30 LAB — CBC
MCH: 27.7 pg (ref 26.0–34.0)
MCHC: 33.5 g/dL (ref 30.0–36.0)
MCV: 82.8 fL (ref 78.0–100.0)
Platelets: 221 10*3/uL (ref 150–400)
RDW: 14.2 % (ref 11.5–15.5)
WBC: 10.5 10*3/uL (ref 4.0–10.5)

## 2012-11-30 MED ORDER — ISOSORBIDE MONONITRATE ER 30 MG PO TB24: 30.0000 mg | ORAL_TABLET | Freq: Every day | ORAL | Status: DC

## 2012-11-30 MED ORDER — ATORVASTATIN CALCIUM 20 MG PO TABS: 20.0000 mg | ORAL_TABLET | Freq: Every day | ORAL | Status: DC

## 2012-11-30 MED ORDER — PROPRANOLOL HCL 20 MG PO TABS: 20.0000 mg | ORAL_TABLET | Freq: Two times a day (BID) | ORAL | Status: DC

## 2012-11-30 MED ORDER — NITROGLYCERIN 0.4 MG SL SUBL: 0.4000 mg | SUBLINGUAL_TABLET | SUBLINGUAL | Status: DC | PRN

## 2012-11-30 MED ORDER — CLOPIDOGREL BISULFATE 75 MG PO TABS: 75.0000 mg | ORAL_TABLET | Freq: Every day | ORAL | Status: DC

## 2012-11-30 NOTE — Progress Notes (Signed)
Discharged to home with family office visits in place teaching done  

## 2012-11-30 NOTE — Discharge Summary (Signed)
I saw the patient prior to discharge.  He was doing well.  Tolerated the medication changes.  I agree with the discharge summary.  Leonie Man, M.D., M.S. [facility] [address]. [place], [place]  [address]  [phone] Pager # [phone] 11/30/2012 4:32 PM

## 2012-11-30 NOTE — Progress Notes (Signed)
I have seen and evaluated the patient this AM along with [place]. I agree with her findings, examination as well as impression recommendations.  Relatively stable post cath.   Ready for discharge on long acting nitrate -- will discuss ASA vs. Plavix (if Plavix - will provide on-line pharmacy information for [facility] yearly Rx plan from [facility] office).  BP stable on BB & ACE-I.    Discussed importance of increase physical activity & wgt loss.  ROV as mentioned.  Can see me ~1-2 months after initial visit.   Leonie Man, M.D., M.S. [facility] [address]. [place], [place]  [address]  [phone] Pager # [phone] 11/30/2012 11:31 AM

## 2012-11-30 NOTE — Progress Notes (Signed)
[facility]  Subjective: No complaints. Only has had mild chest discomfort once this am.   Objective: Vital signs in last 24 hours: Temp:  [97.4 F (36.3 C)-98.2 F (36.8 C)] 97.4 F (36.3 C) (02/20 0417) Pulse Rate:  [61-64] 63 (02/20 0417) Resp:  [18-20] 18 (02/20 0417) BP: (112-133)/(63-89) 112/66 mmHg (02/20 0417) SpO2:  [96 %-100 %] 97 % (02/20 0417) Weight:  [267 lb 10.2 oz (121.4 kg)] 267 lb 10.2 oz (121.4 kg) (02/20 0417) Last BM Date: 11/29/12  Intake/Output from previous day: 02/19 0701 - 02/20 0700 In: 726 [P.O.:480; I.V.:246] Out: 100 [Urine:100] Intake/Output this shift:    Medications Current Facility-Administered Medications  Medication Dose Route Frequency Provider Last Rate Last Dose  . 0.9 %  sodium chloride infusion   Intravenous Continuous Cecilie Kicks, NP 10 mL/hr at 11/28/12 1942 1,000 mL at 11/28/12 1942  . 0.9 %  sodium chloride infusion  250 mL Intravenous PRN Leonie Man, MD      . acetaminophen (TYLENOL) tablet 650 mg  650 mg Oral Q4H PRN Cecilie Kicks, NP   650 mg at 11/29/12 1359  . ALPRAZolam Duanne Moron) tablet 0.25 mg  0.25 mg Oral TID PRN Erlene Quan, PA      . aspirin EC tablet 81 mg  81 mg Oral Daily Lorretta Harp, MD   81 mg at 11/30/12 0944  . atorvastatin (LIPITOR) tablet 20 mg  20 mg Oral q1800 Cecilie Kicks, NP      . clonazePAM Bobbye Charleston) tablet 1 mg  1 mg Oral QHS Cecilie Kicks, NP   1 mg at 11/29/12 2228  . clopidogrel (PLAVIX) tablet 75 mg  75 mg Oral Q breakfast Leonie Man, MD   75 mg at 11/30/12 [phone]  . HYDROcodone-acetaminophen (NORCO) 10-325 MG per tablet 1 tablet  1 tablet Oral Q4H PRN Cecilie Kicks, NP   1 tablet at 11/28/12 2328  . isosorbide mononitrate (IMDUR) 24 hr tablet 30 mg  30 mg Oral Daily Leonie Man, MD   30 mg at 11/29/12 1424  . lisinopril (PRINIVIL,ZESTRIL) tablet 10 mg  10 mg Oral Daily Cecilie Kicks, NP   10 mg at 11/30/12 0944  . nitroGLYCERIN (NITROSTAT) SL tablet 0.4 mg   0.4 mg Sublingual Q5 Min x 3 PRN Cecilie Kicks, NP   0.4 mg at 11/28/12 2135  . ondansetron (ZOFRAN) injection 4 mg  4 mg Intravenous Q6H PRN Cecilie Kicks, NP      . pantoprazole (PROTONIX) EC tablet 40 mg  40 mg Oral Daily Cecilie Kicks, NP   40 mg at 11/30/12 0946  . propranolol (INDERAL) tablet 20 mg  20 mg Oral BID Cecilie Kicks, NP   20 mg at 11/30/12 0944  . sodium chloride 0.9 % injection 3 mL  3 mL Intravenous Q12H Leonie Man, MD   3 mL at 11/29/12 2254  . sodium chloride 0.9 % injection 3 mL  3 mL Intravenous PRN Leonie Man, MD      . traZODone (DESYREL) tablet 100 mg  100 mg Oral QHS Cecilie Kicks, NP   100 mg at 11/29/12 2229  . zolpidem (AMBIEN) tablet 5 mg  5 mg Oral QHS PRN Erlene Quan, PA        PE: General appearance: alert, cooperative, no distress and moderately obese Lungs: clear to auscultation bilaterally Heart: regular rate and rhythm Extremities: no LEE Pulses: 2+ and symmetric Skin: warm and dry Neurologic: Grossly normal  Lab Results:   Recent Labs  11/28/12 1830 11/29/12 0310 11/30/12 0545  WBC 9.5 9.0 10.5  HGB 16.1 15.0 13.5  HCT 45.9 43.5 40.3  PLT 268 233 221   BMET  Recent Labs  11/29/12 0645  NA 137  K 3.9  CL 101  CO2 26  GLUCOSE 99  BUN 18  CREATININE 1.30  CALCIUM 9.7   Studies/Results: [facility] 2/19 - See Dr. 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 Diagnostic cath yesterday, by Dr. Ellyn Hack  revealed severe native CAD, moderate pre-stent stenosis of the SVG-rPDA, recently occluded SVG-OM, w/ inadequate collateral flow. All other grafts were patent. The occluded SVG-OM is thought to be the culprit lesion for the pt's past chest pain.  No lesion was identified that could explain the pt's recent chest pain, thus no PCI was performed. The cath also revealed mildly reduced EF of 45% with mild inferolateral hypokinesis. Will treat medically. He is on Plavix, an ACE-I, BB, statin and long acting nitrate. Radial access site is normal. HR and BP stable. Can D/C today. Will arrange follow-up at [facility] with an Advanced Practitioner in 1-2 weeks.    LOS: 2 days    Brittainy M. Rosita Fire, PA-C 11/30/2012 10:11 AM

## 2012-11-30 NOTE — Discharge Summary (Signed)
Physician Discharge Summary  Patient ID: [name] MRN: [id number] DOB/AGE: 02/21/1956 57 y.o.  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  In November 2011, he underwent staged PCI of both the vein grafts to the PDA (Integrity BMS 3.0 mm 13 mm postdilated to 3.5 mm) and OM (Veriflex 4.0 mm 32 mm postdilated to 4.5 mm) for unstable angina. He also is status post permanent pacer placement (Medtronic) in 2006, secondary to bradycardia. He was admitted directly from [facility], on 2/18 by Dr. Gwenlyn Found, for unstable angina and to rule out MI. He was admitted to stepdown and was placed on IV heparin and NTG. Labs, including a CMP and serial troponin's were ordered on admission, but were incidentally candled by the lab. Subsequently, only one troponin value was obtain, the following morning, which was negative.  On 2/19, he underwent diagnostic left heart catheterization. The procedure was performed by Dr. Ellyn Hack, using the right radial approach. The cath showed both the LIMA-LAD and SVG-Ramus Intermedius to be widely patent, moderate pre-stent stenosis of the SVG-rPDA, and total occlusion of the SVG-OM. He was found to have moderately reduced systolic function, with an estimated EF of 45%. Mild  inferolateral hypokinesis was also noted. The recently occluded SVG-OM was thought to be the culprit lesion for the patient's ongoing angina. No other lesions were identified that necessitated PCI or that would have been amendable to intervention. It was decided to treat medically. He left the cath lab in stable condition, and was kept overnight for further observation and for upward titration of medications.  The patient was already on a beta blocker (10 mg of propranolol daily), as well as an ACE-I (10 mg of lisinopril daily).  His propranolol was increased to 20 mg BID. A long acting nitrate, 30 mg of Imdur daily, was also added, as well as a statin, 20 mg of atorvastatin daily.  He was restarted on an antiplatelet. He had self-discontinued ASA, due to "ASA allergy". However, when discussed with patient, ASA was causing him to bruise easily.  The decision was made to start him on Plavix, 75 mg daily. The plan is to have the patient do a 30 day trial of Plavix, then be re-assessed in four weeks. If he is tolerant of the Plavix, [facility] will provide a yearly [facility] Rx plan. The patient tolerated upward titration of his beta blocker. His BP remained stable. He was last seen and examined by Dr. Ellyn Hack, who felt he was stable for discharge home. Dr. Ellyn Hack also discussed the importance of increased physical activity and weight loss. Hospital follow-up has been arranged on 12/12/12 with Cecilie Kicks, Advanced Practitioner. He will also need to follow up with Dr. Ellyn Hack in 1-2 months. Our office has been notified and they will contact the patient to arrange MD follow-up. Post-cath instructions  were provided to the patient.   Consults: None  Significant Diagnostic Studies: [facility] 11/29/12 Hemodynamics:  Central Aortic / Mean Pressures: 136/94 mmHg; 115 mmHg  Left Ventricular Pressures / EDP: 132/13 mmHg; 21 mmHg Left Ventriculography:  EF: ~45 %  Wall Motion: Mild inferolateral hypokinesis. Coronary Anatomy:   Left Main: large-caliber vessel that trifurcates into a bifurcating Ramus Intermedius, LAD and Circumflex. This calcification but no significant lesion LAD: Is a proximal 95% stenosis followed by diffuse disease at which point the vessel is totally occluded in the midportion.  Left Circumflex: Moderate large-caliber vessel that has significant ostial disease is a roughly 70-80%, vessel and courses down as AV groove circumflex with an occluded OM 1 and likely OM 2 as there is what appears to be a stent in OM1. The previously grafted OM 2 has no antegrade flow  There is left to left collaterals from the LAD and ramus intermedius system filling the distal portion of the large lateral OM that was previously grafted. Ramus Intermedius: Moderate caliber vessel that has an ostial/proximal 90% stenosis before the vessel bifurcates into a more anterolateral/diagonal branch and a more lateral/inferolateral OM branch. There is retrograde flow to the vein graft indicating widely patent vein graft. There is diffuse disease leading up to the insertion site of the graft, with to and fro flow downstream from the graft insertion.  RCA: Small caliber vessel with diffuse proximal and disease is 100% occluded in the midportion.  RPDA: Fills via vein graft with minimal luminal irregularities distally.  RPL Sysytem:The Right Posterior AV Groove Branch fills via retrograde flow from the vein graft into the PDA Graft Anatomy: LIMA-LAD: Widely patent graft, takes off from the distal subclavian artery and takes a tortuous route to the mid LAD. Antegrade flow down the distal LAD shows a tubular 40-50 % stenosis, there is unlikely physiologically significant  SVG-Ramus Intermedius: Widely patent graft with brisk TIMI 3 flow to the distal ramus that retrograde fills the anterolateral branch through a proximal 70-80% lesion.  SVG-OM: 95% stenosis prior to stent followed by 100% total occlusion/in-stent stenosis (thrombosis).   SVG-RCA: 40% stenosis prior to the stent is widely patent. The graft inserts on the proximal RPDA. There is diffuse luminal irregularities but no significant stenosis beyond the stent.  EBL: < 5 ml  PATIENT DISPOSITION:  The patient was transferred to the PACU holding area in a hemodynamicaly stable, chest pain free condition.  The patient tolerated the procedure well, and there were no complications.  The patient was stable before, during, and after the procedure. POST-OPERATIVE DIAGNOSIS:  Severe native CAD as described.  The likely culprit lesion for the patient's chest discomfort several months ago as well as ongoing chest pain is the likely recently occluded SVG-OM and inadequate collateral flow.  Moderate pre-stent stenosis of the SVG-rPDA  Mildly reduced Ejection Fraction with expected inferolateral hypokinesis   Treatments: See Hospital Course  Discharge Exam: Blood pressure 112/66, pulse 63, temperature 97.4 F (36.3 C), temperature source Oral, resp. rate 18, height 5\' 10"  (1.778 m), weight 267 lb 10.2 oz (121.4 kg), SpO2 97.00%.   Disposition: 01-Home or Self Care      Discharge Orders   Future Orders Complete By Expires     Diet - low sodium heart healthy  As directed     Discharge instructions  As directed     Comments:      No lifting more than 1/2 gallon of milk for 3 days. No driving for 3 days.  Increase activity slowly  As directed         Medication List    TAKE these medications       atorvastatin 20 MG tablet  Commonly known as:  LIPITOR  Take 1 tablet (20 mg total) by mouth daily at 6 PM.     clonazePAM 1 MG tablet  Commonly known as:  KLONOPIN  Take 1 mg by mouth at bedtime.     clopidogrel 75 MG tablet  Commonly known as:  PLAVIX  Take 1 tablet (75 mg total) by mouth daily with breakfast.     HYDROcodone-acetaminophen 5-325 MG per tablet  Commonly known as:  NORCO/VICODIN  Take 1 tablet by mouth every 8 (eight) hours as needed for pain.      isosorbide mononitrate 30 MG 24 hr tablet  Commonly known as:  IMDUR  Take 1 tablet (30 mg total) by mouth daily.     lisinopril 10 MG tablet  Commonly known as:  PRINIVIL,ZESTRIL  Take 10 mg by mouth daily.     nitroGLYCERIN 0.4 MG SL tablet  Commonly known as:  NITROSTAT  Place 1 tablet (0.4 mg total) under the tongue every 5 (five) minutes x 3 doses as needed for chest pain.     propranolol 20 MG tablet  Commonly known as:  INDERAL  Take 1 tablet (20 mg total) by mouth 2 (two) times daily.     QUEtiapine 50 MG tablet  Commonly known as:  SEROQUEL  Take 50 mg by mouth daily as needed (for sleep).     traZODone 100 MG tablet  Commonly known as:  DESYREL  Take 100 mg by mouth at bedtime.       Follow-up Information   Follow up with [facility] R, NP On 12/12/2012. (1:40 pm)    Contact information:   [address] [place] [address] [phone]       Follow up with Leonie Man, MD. (our offce will call to arrange follow-up with Dr. Ellyn Hack)    Contact information:   [address], [address] [place], [place] [address] [phone]       Signed: Lyda Jester 11/30/2012, 3:34 PM

## 2012-12-19 ENCOUNTER — Ambulatory Visit
Admission: RE | Admit: 2012-12-19 | Discharge: 2012-12-19 | Disposition: A | Discharge status: Home / Self Care | Payer: Medicare PPO | Source: Ambulatory Visit | Attending: Neurosurgery | Admitting: Neurosurgery

## 2012-12-19 ENCOUNTER — Other Ambulatory Visit: Payer: Self-pay | Admitting: Neurosurgery

## 2012-12-19 DIAGNOSIS — M549 Dorsalgia, unspecified: Secondary | ICD-10-CM

## 2012-12-29 ENCOUNTER — Other Ambulatory Visit: Payer: Self-pay | Admitting: Neurosurgery

## 2012-12-29 DIAGNOSIS — M47816 Spondylosis without myelopathy or radiculopathy, lumbar region: Secondary | ICD-10-CM

## 2013-01-09 ENCOUNTER — Other Ambulatory Visit: Payer: Medicare PPO

## 2013-01-09 ENCOUNTER — Ambulatory Visit
Admission: RE | Admit: 2013-01-09 | Discharge: 2013-01-09 | Disposition: A | Discharge status: Home / Self Care | Payer: Medicare PPO | Source: Ambulatory Visit | Attending: Neurosurgery | Admitting: Neurosurgery

## 2013-01-09 ENCOUNTER — Other Ambulatory Visit: Payer: Self-pay | Admitting: Neurosurgery

## 2013-01-09 DIAGNOSIS — M47816 Spondylosis without myelopathy or radiculopathy, lumbar region: Secondary | ICD-10-CM

## 2013-01-09 MED ORDER — IOHEXOL 180 MG/ML  SOLN
2.0000 mL | Freq: Once | INTRAMUSCULAR | Status: AC | PRN
  Administered 2013-01-09: 2 mL via INTRA_ARTICULAR

## 2013-01-09 MED ORDER — METHYLPREDNISOLONE ACETATE 40 MG/ML INJ SUSP (RADIOLOG
120.0000 mg | Freq: Once | INTRAMUSCULAR | Status: AC
  Administered 2013-01-09: 120 mg via INTRA_ARTICULAR

## 2013-01-13 ENCOUNTER — Inpatient Hospital Stay (HOSPITAL_COMMUNITY)
Admission: AD | Admit: 2013-01-13 | Discharge: 2013-01-18 | DRG: 247 | Disposition: A | Discharge status: Home / Self Care | Payer: Medicare PPO | Source: Other Acute Inpatient Hospital | Attending: Cardiovascular Disease | Admitting: Cardiovascular Disease

## 2013-01-13 DIAGNOSIS — Z79899 Other long term (current) drug therapy: Secondary | ICD-10-CM

## 2013-01-13 DIAGNOSIS — G4733 Obstructive sleep apnea (adult) (pediatric): Secondary | ICD-10-CM | POA: Diagnosis present

## 2013-01-13 DIAGNOSIS — I2510 Atherosclerotic heart disease of native coronary artery without angina pectoris: Secondary | ICD-10-CM | POA: Diagnosis present

## 2013-01-13 DIAGNOSIS — I214 Non-ST elevation (NSTEMI) myocardial infarction: Secondary | ICD-10-CM | POA: Diagnosis present

## 2013-01-13 DIAGNOSIS — I2582 Chronic total occlusion of coronary artery: Secondary | ICD-10-CM | POA: Diagnosis present

## 2013-01-13 DIAGNOSIS — I10 Essential (primary) hypertension: Secondary | ICD-10-CM | POA: Diagnosis present

## 2013-01-13 DIAGNOSIS — Z7902 Long term (current) use of antithrombotics/antiplatelets: Secondary | ICD-10-CM

## 2013-01-13 DIAGNOSIS — Z6837 Body mass index (BMI) 37.0-37.9, adult: Secondary | ICD-10-CM

## 2013-01-13 DIAGNOSIS — Z950 Presence of cardiac pacemaker: Secondary | ICD-10-CM | POA: Diagnosis present

## 2013-01-13 DIAGNOSIS — E785 Hyperlipidemia, unspecified: Secondary | ICD-10-CM | POA: Diagnosis present

## 2013-01-13 DIAGNOSIS — F411 Generalized anxiety disorder: Secondary | ICD-10-CM | POA: Diagnosis not present

## 2013-01-13 DIAGNOSIS — T82897A Other specified complication of cardiac prosthetic devices, implants and grafts, initial encounter: Secondary | ICD-10-CM | POA: Diagnosis present

## 2013-01-13 DIAGNOSIS — E669 Obesity, unspecified: Secondary | ICD-10-CM | POA: Diagnosis present

## 2013-01-13 DIAGNOSIS — G250 Essential tremor: Secondary | ICD-10-CM | POA: Diagnosis present

## 2013-01-13 DIAGNOSIS — I25810 Atherosclerosis of coronary artery bypass graft(s) without angina pectoris: Secondary | ICD-10-CM | POA: Diagnosis present

## 2013-01-13 DIAGNOSIS — Z9989 Dependence on other enabling machines and devices: Secondary | ICD-10-CM | POA: Diagnosis present

## 2013-01-13 DIAGNOSIS — M549 Dorsalgia, unspecified: Secondary | ICD-10-CM | POA: Diagnosis present

## 2013-01-13 DIAGNOSIS — Y849 Medical procedure, unspecified as the cause of abnormal reaction of the patient, or of later complication, without mention of misadventure at the time of the procedure: Secondary | ICD-10-CM | POA: Diagnosis present

## 2013-01-13 DIAGNOSIS — Z9861 Coronary angioplasty status: Secondary | ICD-10-CM

## 2013-01-13 DIAGNOSIS — I200 Unstable angina: Secondary | ICD-10-CM

## 2013-01-13 DIAGNOSIS — G252 Other specified forms of tremor: Secondary | ICD-10-CM | POA: Diagnosis present

## 2013-01-13 DIAGNOSIS — G8929 Other chronic pain: Secondary | ICD-10-CM | POA: Diagnosis present

## 2013-01-14 ENCOUNTER — Encounter (HOSPITAL_COMMUNITY): Payer: Self-pay | Admitting: *Deleted

## 2013-01-14 DIAGNOSIS — I200 Unstable angina: Secondary | ICD-10-CM

## 2013-01-14 LAB — TROPONIN I
Troponin I: 0.68 ng/mL (ref <0.30)
Troponin I: 0.93 ng/mL (ref <0.30)

## 2013-01-14 LAB — COMPREHENSIVE METABOLIC PANEL
AST: 21 U/L (ref 0–37)
Albumin: 3.7 g/dL (ref 3.5–5.2)
Alkaline Phosphatase: 55 U/L (ref 39–117)
BUN: 15 mg/dL (ref 6–23)
CO2: 24 mEq/L (ref 19–32)
Chloride: 102 mEq/L (ref 96–112)
Creatinine, Ser: 1 mg/dL (ref 0.50–1.35)
GFR calc non Af Amer: 82 mL/min — ABNORMAL LOW (ref >90)
Potassium: 3.9 mEq/L (ref 3.5–5.1)
Total Bilirubin: 0.9 mg/dL (ref 0.3–1.2)

## 2013-01-14 LAB — CBC WITH DIFFERENTIAL/PLATELET
Basophils Relative: 1 % (ref 0–1)
HCT: 42.9 % (ref 39.0–52.0)
Hemoglobin: 15.3 g/dL (ref 13.0–17.0)
Lymphocytes Relative: 34 % (ref 12–46)
Monocytes Absolute: 0.9 10*3/uL (ref 0.1–1.0)
Monocytes Relative: 10 % (ref 3–12)
Neutro Abs: 5 10*3/uL (ref 1.7–7.7)
Neutrophils Relative %: 54 % (ref 43–77)
RBC: 5.27 MIL/uL (ref 4.22–5.81)
WBC: 9.3 10*3/uL (ref 4.0–10.5)

## 2013-01-14 LAB — MRSA PCR SCREENING: MRSA by PCR: NEGATIVE

## 2013-01-14 MED ORDER — ZOLPIDEM TARTRATE 5 MG PO TABS
5.0000 mg | ORAL_TABLET | Freq: Every evening | ORAL | Status: DC | PRN
  Administered 2013-01-15 – 2013-01-16 (×2): 5 mg via ORAL
  Filled 2013-01-14 (×2): qty 1

## 2013-01-14 MED ORDER — ISOSORBIDE MONONITRATE ER 60 MG PO TB24
60.0000 mg | ORAL_TABLET | Freq: Every day | ORAL | Status: DC
  Administered 2013-01-14 – 2013-01-18 (×5): 60 mg via ORAL
  Filled 2013-01-14 (×6): qty 1

## 2013-01-14 MED ORDER — ACETAMINOPHEN 650 MG RE SUPP: 650.0000 mg | Freq: Four times a day (QID) | RECTAL | Status: DC | PRN

## 2013-01-14 MED ORDER — ALPRAZOLAM 0.25 MG PO TABS: 0.2500 mg | ORAL_TABLET | Freq: Three times a day (TID) | ORAL | Status: DC | PRN

## 2013-01-14 MED ORDER — ONDANSETRON HCL 4 MG PO TABS: 4.0000 mg | ORAL_TABLET | Freq: Four times a day (QID) | ORAL | Status: DC | PRN

## 2013-01-14 MED ORDER — CLOPIDOGREL BISULFATE 75 MG PO TABS
75.0000 mg | ORAL_TABLET | Freq: Once | ORAL | Status: AC
  Administered 2013-01-14: 75 mg via ORAL
  Filled 2013-01-14: qty 1

## 2013-01-14 MED ORDER — ONDANSETRON HCL 4 MG/2ML IJ SOLN: 4.0000 mg | Freq: Four times a day (QID) | INTRAMUSCULAR | Status: DC | PRN

## 2013-01-14 MED ORDER — RANOLAZINE ER 500 MG PO TB12
500.0000 mg | ORAL_TABLET | Freq: Two times a day (BID) | ORAL | Status: DC
  Administered 2013-01-14 – 2013-01-17 (×7): 500 mg via ORAL
  Filled 2013-01-14 (×9): qty 1

## 2013-01-14 MED ORDER — ENOXAPARIN SODIUM 40 MG/0.4ML ~~LOC~~ SOLN
40.0000 mg | Freq: Every day | SUBCUTANEOUS | Status: DC
  Administered 2013-01-14 – 2013-01-15 (×2): 40 mg via SUBCUTANEOUS
  Filled 2013-01-14 (×2): qty 0.4

## 2013-01-14 MED ORDER — CLONAZEPAM 0.5 MG PO TABS
2.0000 mg | ORAL_TABLET | Freq: Every day | ORAL | Status: DC
  Administered 2013-01-14 – 2013-01-17 (×5): 2 mg via ORAL
  Filled 2013-01-14: qty 2
  Filled 2013-01-14: qty 4
  Filled 2013-01-14 (×4): qty 2

## 2013-01-14 MED ORDER — SODIUM CHLORIDE 0.9 % IV SOLN: INTRAVENOUS | Status: DC

## 2013-01-14 MED ORDER — OXYCODONE HCL 5 MG PO TABS
5.0000 mg | ORAL_TABLET | ORAL | Status: DC | PRN
  Filled 2013-01-14: qty 1

## 2013-01-14 MED ORDER — SODIUM CHLORIDE 0.9 % IJ SOLN
3.0000 mL | Freq: Two times a day (BID) | INTRAMUSCULAR | Status: DC
  Administered 2013-01-14 – 2013-01-15 (×4): 3 mL via INTRAVENOUS

## 2013-01-14 MED ORDER — ASPIRIN 325 MG PO TABS
325.0000 mg | ORAL_TABLET | Freq: Every day | ORAL | Status: DC
  Administered 2013-01-14 – 2013-01-16 (×3): 325 mg via ORAL
  Filled 2013-01-14 (×4): qty 1

## 2013-01-14 MED ORDER — HYDROMORPHONE HCL PF 1 MG/ML IJ SOLN: 0.5000 mg | INTRAMUSCULAR | Status: DC | PRN

## 2013-01-14 MED ORDER — NITROGLYCERIN 0.4 MG SL SUBL: 0.4000 mg | SUBLINGUAL_TABLET | SUBLINGUAL | Status: DC | PRN

## 2013-01-14 MED ORDER — NITROGLYCERIN 2 % TD OINT
1.0000 [in_us] | TOPICAL_OINTMENT | Freq: Four times a day (QID) | TRANSDERMAL | Status: DC
  Administered 2013-01-14: 1 [in_us] via TOPICAL
  Filled 2013-01-14: qty 30

## 2013-01-14 MED ORDER — ACETAMINOPHEN 325 MG PO TABS
650.0000 mg | ORAL_TABLET | Freq: Four times a day (QID) | ORAL | Status: DC | PRN
  Administered 2013-01-14 – 2013-01-17 (×6): 650 mg via ORAL
  Filled 2013-01-14 (×5): qty 2

## 2013-01-14 MED ORDER — TRAMADOL HCL 50 MG PO TABS: 50.0000 mg | ORAL_TABLET | Freq: Four times a day (QID) | ORAL | Status: DC | PRN

## 2013-01-14 NOTE — Progress Notes (Signed)
Kerin Ransom PA called back and updated on pts CP and the steps taken and that pt pain free currently. Pt and significant other seem to be having some issues and anxiety seems to be high. Lurena Joiner made aware of the anxiety also. Will cont to monitor.

## 2013-01-14 NOTE — Progress Notes (Signed)
[facility]  DAILY PROGRESS NOTE   Subjective:  No angina or dyspnea. Arrhythmia free. Minimal increase in cTnI.  Objective:  Temp:  [97.5 F (36.4 C)-97.7 F (36.5 C)] 97.5 F (36.4 C) (04/06 0800) Pulse Rate:  [71-80] 71 (04/06 0800) Resp:  [14-22] 17 (04/06 0800) BP: (113-160)/(71-113) 119/71 mmHg (04/06 0800) SpO2:  [96 %-98 %] 98 % (04/06 0800) Weight:  [121.8 kg (268 lb 8.3 oz)] 121.8 kg (268 lb 8.3 oz) (04/05 2358) Weight change:   Intake/Output from previous day: 04/05 0701 - 04/06 0700 In: 240 [P.O.:240] Out: 480 [Urine:480]  Intake/Output from this shift:    Medications: Current Facility-Administered Medications  Medication Dose Route Frequency Provider Last Rate Last Dose  . 0.9 %  sodium chloride infusion   Intravenous Continuous Theressa Millard, MD      . acetaminophen (TYLENOL) tablet 650 mg  650 mg Oral Q6H PRN Theressa Millard, MD   650 mg at 01/14/13 0347   Or  . acetaminophen (TYLENOL) suppository 650 mg  650 mg Rectal Q6H PRN Theressa Millard, MD      . aspirin tablet 325 mg  325 mg Oral Daily Theressa Millard, MD      . clonazePAM Bobbye Charleston) tablet 2 mg  2 mg Oral QHS Theressa Millard, MD   2 mg at 01/14/13 0309  . enoxaparin (LOVENOX) injection 40 mg  40 mg Subcutaneous Daily Harvette Evonnie Dawes, MD      . HYDROmorphone (DILAUDID) injection 0.5-1 mg  0.5-1 mg Intravenous Q3H PRN Theressa Millard, MD      . nitroGLYCERIN (NITROGLYN) 2 % ointment 1 inch  1 inch Topical Q6H Theressa Millard, MD   1 inch at 01/14/13 0310  . nitroGLYCERIN (NITROSTAT) SL tablet 0.4 mg  0.4 mg Sublingual Q5 min PRN Theressa Millard, MD      . ondansetron (ZOFRAN) tablet 4 mg  4 mg Oral Q6H PRN Theressa Millard, MD       Or  . ondansetron (ZOFRAN) injection 4 mg  4 mg Intravenous Q6H PRN Theressa Millard, MD      . oxyCODONE (Oxy IR/ROXICODONE) immediate release tablet 5 mg  5 mg Oral Q4H PRN Theressa Millard, MD      .  sodium chloride 0.9 % injection 3 mL  3 mL Intravenous Q12H Theressa Millard, MD   3 mL at 01/14/13 0314  . zolpidem (AMBIEN) tablet 5 mg  5 mg Oral QHS PRN Theressa Millard, MD        Physical Exam: General appearance: alert, cooperative and no distress Neck: no adenopathy, no carotid bruit, no JVD, supple, symmetrical, trachea midline and thyroid not enlarged, symmetric, no tenderness/mass/nodules Lungs: clear to auscultation bilaterally Heart: regular rate and rhythm, S1, S2 normal, no murmur, click, rub or gallop Abdomen: soft, non-tender; bowel sounds normal; no masses,  no organomegaly Extremities: extremities normal, atraumatic, no cyanosis or edema Pulses: 2+ and symmetric Skin: Skin color, texture, turgor normal. No rashes or lesions Neurologic: Grossly normal  Lab Results: Results for orders placed during the hospital encounter of 01/13/13 (from the past 48 hour(s))  MRSA PCR SCREENING     Status: None   Collection Time    01/13/13 11:22 PM      Result Value Range   MRSA by PCR NEGATIVE  NEGATIVE   Comment:            The GeneXpert MRSA Assay (  FDA     approved for NASAL specimens     only), is one component of a     comprehensive MRSA colonization     surveillance program. It is not     intended to diagnose MRSA     infection nor to guide or     monitor treatment for     MRSA infections.  TROPONIN I     Status: Abnormal   Collection Time    01/14/13  1:52 AM      Result Value Range   Troponin I 0.74 (*) <0.30 ng/mL   Comment:            Due to the release kinetics of cTnI,     a negative result within the first hours     of the onset of symptoms does not rule out     myocardial infarction with certainty.     If myocardial infarction is still suspected,     repeat the test at appropriate intervals.     CRITICAL RESULT CALLED TO, READ BACK BY AND VERIFIED WITH:     ROBERTS J,RN 01/14/13 0246 WAYK  CBC WITH DIFFERENTIAL     Status: None   Collection Time     01/14/13  2:05 AM      Result Value Range   WBC 9.3  4.0 - 10.5 K/uL   RBC 5.27  4.22 - 5.81 MIL/uL   Hemoglobin 15.3  13.0 - 17.0 g/dL   HCT 42.9  39.0 - 52.0 %   MCV 81.4  78.0 - 100.0 fL   MCH 29.0  26.0 - 34.0 pg   MCHC 35.7  30.0 - 36.0 g/dL   RDW 14.4  11.5 - 15.5 %   Platelets 227  150 - 400 K/uL   Neutrophils Relative 54  43 - 77 %   Neutro Abs 5.0  1.7 - 7.7 K/uL   Lymphocytes Relative 34  12 - 46 %   Lymphs Abs 3.2  0.7 - 4.0 K/uL   Monocytes Relative 10  3 - 12 %   Monocytes Absolute 0.9  0.1 - 1.0 K/uL   Eosinophils Relative 2  0 - 5 %   Eosinophils Absolute 0.2  0.0 - 0.7 K/uL   Basophils Relative 1  0 - 1 %   Basophils Absolute 0.1  0.0 - 0.1 K/uL  COMPREHENSIVE METABOLIC PANEL     Status: Abnormal   Collection Time    01/14/13  2:05 AM      Result Value Range   Sodium 137  135 - 145 mEq/L   Potassium 3.9  3.5 - 5.1 mEq/L   Chloride 102  96 - 112 mEq/L   CO2 24  19 - 32 mEq/L   Glucose, Bld 116 (*) 70 - 99 mg/dL   BUN 15  6 - 23 mg/dL   Creatinine, Ser 1.00  0.50 - 1.35 mg/dL   Calcium 9.3  8.4 - 10.5 mg/dL   Total Protein 7.0  6.0 - 8.3 g/dL   Albumin 3.7  3.5 - 5.2 g/dL   AST 21  0 - 37 U/L   ALT 17  0 - 53 U/L   Alkaline Phosphatase 55  39 - 117 U/L   Total Bilirubin 0.9  0.3 - 1.2 mg/dL   GFR calc non Af Amer 82 (*) >90 mL/min   GFR calc Af Amer >90  >90 mL/min   Comment:  The eGFR has been calculated     using the CKD EPI equation.     This calculation has not been     validated in all clinical     situations.     eGFR's persistently     <90 mL/min signify     possible Chronic Kidney Disease.  TROPONIN I     Status: Abnormal   Collection Time    01/14/13  8:05 AM      Result Value Range   Troponin I 0.68 (*) <0.30 ng/mL   Comment:            Due to the release kinetics of cTnI,     a negative result within the first hours     of the onset of symptoms does not rule out     myocardial infarction with certainty.     If myocardial  infarction is still suspected,     repeat the test at appropriate intervals.     CRITICAL VALUE NOTED.  VALUE IS CONSISTENT WITH PREVIOUSLY REPORTED AND CALLED VALUE.    Imaging: No results found.  Assessment:  1. Principal Problem: 2.   Unstable angina 3. Active Problems: 4.   CAD-CABG 2002 (LIMA-LAD, VG-ramus intermediate, SVG-OM, SVG-PDA) s/p Cath 11/29/12-total occulsion of SVG-OM, EF of 45% 5.   OSA on CPAP 6.   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   Tremor, essential, treated with propranolol 8.   HTN (hypertension) 9.   Dyslipidemia, with intolerance to Crestor and zocor and possible cough with Lipitor 10.   Back pain, chronic 11.   Plan:  1. Unstable angina without ECG changes and with minimal abnormality of the cardiac enzymes, now completely asymptomatic. 2. Recent cath noted - it is possible that ischemia is in the territory of the proximal LAD, downstream of proximal 95% stenosis, upstream of 100% occlusion (distal vessel filled via LIMA) versus newly occluded SVG-OM territory. Will review films with interventional cardiologists to look for any revascularization options, but there is also room to enhance antianginal therapy. Will increase nitrates and add ranolazine. Consider also adding amlodipine if angina persists. 3. Avoid quetiapine with ranolazine (he takes this medication "prn depression" very rarely) 4. Patient will remain on the [facility] Cardiology service.   Time Spent Directly with Patient:  30 minutes  Length of Stay:  LOS: 1 day    [name] 01/14/2013, 8:59 AM

## 2013-01-14 NOTE — Progress Notes (Signed)
Patient c/o chest pain 5/10 at 1905.  EKG obtained, O2 at 4L started, BP 175/66 and one sublingual nitroglycerine given without relief.  1910 still rates pain at 3/10. BP 134/80, second dose of sublingual nitro given and was effective, pain 0/10.  MD on call paged, still waiting for call back.  Will continue to monitor patient.  Marland Kitchen

## 2013-01-14 NOTE — H&P (Signed)
[facility] History and Physical  [name] [id number] DOB: [birth year] DOA: 01/13/2013  Referring physician: Recardo Evangelist PCP: Ward Givens  Specialists:   Chief Complaint:   HPI: [name] is a 57 y.o. male with an extensive Cardiac history who presented to the ED at [facility] due to complaints of severe 10/10 SSCP described as tightness and squeezing in his chest.  The first episode of pain began the night before and he took 4 NTG tabs which only gave him minimal relief, and when he woke up the next AM, he had massive chest pain that was unremitting, and was associated with nausea, and SOB and diaphoresis.  He went to the ED at 3:30 PM and was evaluated in the ED and arrangements were made to transfer him to [facility] where his cardiologist is. His ED evaluation was initially negative.   Dr. Recardo Evangelist of [facility] was the cardiologist who accepted the transfer.   On arrival he is painfree.     His cardiac history is significant for CAD S/P CABG, S/P Stent , S/P Medtronic Pacemaker.   He was hospitalized and treated by [facility] on 02/18 For Unstable Angina, and had a Diagnostic Cath performed on 02/19 by Dr. Ellyn Hack which revealed severe native CAD, moderate pre-stent stenosis of the SVG-rPDA, recently occluded SVG-OM, w/ inadequate collateral flow. All other grafts were patent. The occluded SVG-OM is thought to be the culprit lesion for the pt's past chest pain. No lesion was identified that could explain the pt's recent chest pain, thus no PCI was performed. The cath also revealed mildly reduced EF of 45% with mild inferolateral hypokinesis. The decision was made  At that time to treat medically.  He was to continue on Plavix, an ACE-I, BB, statin and long acting nitrate.      Review of Systems: The patient denies anorexia, fever, chills, headache, weight loss, vision loss, decreased hearing, hoarseness, syncope, dyspnea on exertion, peripheral edema, balance deficits,  hemoptysis, abdominal pain, nausea, vomiting, diarrhea, constipation, hematemesis, melena, hematochezia, severe indigestion/heartburn, hematuria, incontinence, muscle weakness, suspicious skin lesions, transient blindness, difficulty walking, depression, unusual weight change, abnormal bleeding, enlarged lymph nodes, angioedema, and breast masses.    Past Medical History  Diagnosis Date  . Coronary artery disease   . Anginal pain   . CAD (coronary artery disease), with CABG 2002 LIMA-LAD, VG-ramus intermediate, SVG-OM, SVG-PDA 11/28/2012  . CAD (coronary artery disease) of artery bypass graft, with stent to VG-OM-([facility]) 11/28/2012  . OSA on CPAP 11/28/2012  . 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 Tremor, essential, treated with propranolol 11/28/2012  . HTN (hypertension) 11/28/2012  . Dyslipidemia, with intolerance to Crestor and zocor and possible cough with Lipitor 11/28/2012  . Back pain, chronic 11/28/2012   Past Surgical History  Procedure Laterality Date  . Coronary artery bypass graft    . Cardiac catheterization    . Coronary angioplasty    . Insert / replace / remove pacemaker    . Back surgery       Medications:  HOME MEDS: Prior to Admission medications   Medication Sig Start Date End Date Taking? Authorizing Provider  atorvastatin (LIPITOR) 20 MG tablet Take 1 tablet (20 mg total) by mouth daily at 6 PM. 11/30/12   Brittainy Simmons, PA-C  clonazePAM (KLONOPIN) 1 MG tablet Take 1 mg by mouth at bedtime.    Historical Provider, MD  clopidogrel (PLAVIX) 75 MG tablet Take 1 tablet (75 mg total) by  mouth daily with breakfast. 11/30/12   Lyda Jester, PA-C  HYDROcodone-acetaminophen (NORCO/VICODIN) 5-325 MG per tablet Take 1 tablet by mouth every 8 (eight) hours as needed for pain.    Historical Provider, MD  isosorbide mononitrate (IMDUR) 30 MG 24 hr tablet Take 1 tablet (30 mg total) by mouth daily. 11/30/12   Brittainy Simmons,  PA-C  lisinopril (PRINIVIL,ZESTRIL) 10 MG tablet Take 10 mg by mouth daily.    Historical Provider, MD  nitroGLYCERIN (NITROSTAT) 0.4 MG SL tablet Place 1 tablet (0.4 mg total) under the tongue every 5 (five) minutes x 3 doses as needed for chest pain. 11/30/12   Brittainy Simmons, PA-C  propranolol (INDERAL) 20 MG tablet Take 1 tablet (20 mg total) by mouth 2 (two) times daily. 11/30/12   Brittainy Simmons, PA-C  QUEtiapine (SEROQUEL) 50 MG tablet Take 50 mg by mouth daily as needed (for sleep).    Historical Provider, MD  traZODone (DESYREL) 100 MG tablet Take 100 mg by mouth at bedtime.    Historical Provider, MD    Allergies:  Allergies  Allergen Reactions  . Crestor (Rosuvastatin)     Muscle aches  . Zocor (Simvastatin)     Muscle aches     Social History:   reports that he has never smoked. He does not have any smokeless tobacco history on file. He reports that he does not drink alcohol or use illicit drugs.   Family History:        CAD in Father        HTN in Father        Diabetes in Father   Physical Exam:  GEN:  Pleasant Obese 57 year old well nourished and well developed Caucasian Male examined  and in no acute distress; cooperative with exam Filed Vitals:   01/14/13 0015 01/14/13 0030 01/14/13 0045 01/14/13 0105  BP: 138/103 160/109 157/113 153/106  Pulse:      Temp:      TempSrc:      Resp: 14 18 19 22   Height:      Weight:      SpO2: 96% 98% 96% 96%   Blood pressure 153/106, pulse 80, temperature 97.7 F (36.5 C), temperature source Oral, resp. rate 22, height 5\' 10"  (1.778 m), weight 121.8 kg (268 lb 8.3 oz), SpO2 96.00%. PSYCH: He is alert and oriented x4; does not appear anxious does not appear depressed; affect is normal HEENT: Normocephalic and Atraumatic, Mucous membranes pink; PERRLA; EOM intact; Fundi:  Benign;  No scleral icterus, Nares: Patent, Oropharynx: Clear, Fair Dentition, Neck:  FROM, no cervical lymphadenopathy nor thyromegaly or carotid  bruit; no JVD; Breasts:: Not examined CHEST WALL: No tenderness   Left Anterior Nodule (Pacer) CHEST: Normal respiration, clear to auscultation bilaterally HEART: Regular rate and rhythm; no murmurs rubs or gallops BACK: No kyphosis or scoliosis; no CVA tenderness ABDOMEN: Positive Bowel Sounds, Obese, soft non-tender; no masses, no organomegaly, no pannus; no intertriginous candida. Rectal Exam: Not done EXTREMITIES: No bone or joint deformity; age-appropriate arthropathy of the hands and knees; no cyanosis, clubbing or edema; no ulcerations. Genitalia: not examined PULSES: 2+ and symmetric SKIN: Normal hydration no rash or ulceration CNS: Cranial nerves 2-12 grossly intact no focal neurologic deficit   Labs & Imaging Results for orders placed during the hospital encounter of 01/13/13 (from the past 48 hour(s))  MRSA PCR SCREENING     Status: None   Collection Time    01/13/13 11:22 PM  Result Value Range   MRSA by PCR NEGATIVE  NEGATIVE   Comment:            The GeneXpert MRSA Assay (FDA     approved for NASAL specimens     only), is one component of a     comprehensive MRSA colonization     surveillance program. It is not     intended to diagnose MRSA     infection nor to guide or     monitor treatment for     MRSA infections.     Labs on 01/13/2013 at [facility]                      CBC:              WBC:    8.9  N%61.4      HGB:    14.9    HCT:     44    PLTs:    274          CMET:      Na+:    141    K+: 3.7    Cl-: 107    CO2: 26    BUN: 16    CR: 1.1     GLU:  123      CPK/CK:   218   NORMAL RANGE: ( 39-308)    TROPONIN:  0.35    NORMAL RANGE: (0.00-0.07)        Radiological Exams on  Performed At [facility] on 01/13/2013             Portable CXR:  NAD, Medtronic Pacer present, and Sternotomy changes seen.         EKG: Independently reviewed.  Performed at Performed At [facility] on 01/13/2013 at 16:43.                 Normal sinus Rhythm with Q waves in Inferior Leads seen on Previous EKGs,  Otherwise No Acute changes .        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   Unstable Angina- Admitted to Stepdown/CCU for cardiac monitoring-   Cycle Troponins q 6hrs x 3,  Place on Nitropaste, O2, and ASA, continue B-Blocker therapy and Plavix.   SEHV to  see in AM.     2.   CAD hx-  Workup as above in #1.    3.    Dyslipidemia-  Check fasting lipids, Continue Atorvastatin 20 mg PO qhs.     4.    HTN-  Continue Lisinopril, and Propanolol.   PRN IV hydralazine for elevated SBP  > 160  5.   OSA-  Continue CPAP qhs.  6.   Chronic Back Pain- No changes, PRN IV Dilaudid.    7.   Tremor- stable.       Code Status:    FULL CODE Family Communication:   Wife at Bedside Disposition Plan:    Return to Home on Discharge  Time spent:  [facility] Pager [phone]  If 7PM-7AM, please contact night-coverage [email or web address] Password [email or web address] 01/14/2013, 1:24 AM

## 2013-01-15 MED ORDER — PROPRANOLOL HCL 20 MG PO TABS
20.0000 mg | ORAL_TABLET | Freq: Two times a day (BID) | ORAL | Status: DC
  Administered 2013-01-15 – 2013-01-17 (×5): 20 mg via ORAL
  Filled 2013-01-15 (×6): qty 1

## 2013-01-15 MED ORDER — LISINOPRIL 10 MG PO TABS
10.0000 mg | ORAL_TABLET | Freq: Every day | ORAL | Status: DC
  Administered 2013-01-15 – 2013-01-18 (×4): 10 mg via ORAL
  Filled 2013-01-15 (×5): qty 1

## 2013-01-15 MED ORDER — ENOXAPARIN SODIUM 80 MG/0.8ML ~~LOC~~ SOLN
80.0000 mg | SUBCUTANEOUS | Status: AC
  Administered 2013-01-15: 80 mg via SUBCUTANEOUS
  Filled 2013-01-15: qty 0.8

## 2013-01-15 MED ORDER — ENOXAPARIN SODIUM 120 MG/0.8ML ~~LOC~~ SOLN
120.0000 mg | Freq: Two times a day (BID) | SUBCUTANEOUS | Status: DC
  Administered 2013-01-15 – 2013-01-16 (×2): 120 mg via SUBCUTANEOUS
  Filled 2013-01-15 (×5): qty 0.8

## 2013-01-15 NOTE — Progress Notes (Signed)
RT checked on patient about wearing cpap machine tonight. PT states he can put the machine on himself when he is ready to use it. Pt says he has a machine at home and is familiar with how to use it without any help. RT advised pt to call if he does need any help at all with the machine. RT will assist as needed.

## 2013-01-15 NOTE — Progress Notes (Signed)
Utilization review completed.  

## 2013-01-15 NOTE — Progress Notes (Signed)
[facility]  Subjective: No chest pain currently. Had an episode of SSCP last night.   Objective: Vital signs in last 24 hours: Temp:  [97.7 F (36.5 C)-98.5 F (36.9 C)] 97.7 F (36.5 C) (04/07 0500) Pulse Rate:  [71-97] 71 (04/07 0500) Resp:  [18] 18 (04/07 0500) BP: (130-175)/(66-91) 157/91 mmHg (04/07 0500) SpO2:  [95 %-99 %] 99 % (04/07 0500) Weight:  [262 lb 4.8 oz (118.978 kg)] 262 lb 4.8 oz (118.978 kg) (04/06 1131) Last BM Date: 01/14/13  Intake/Output from previous day: 04/06 0701 - 04/07 0700 In: -  Out: 400 [Urine:400] Intake/Output this shift:    Medications Current Facility-Administered Medications  Medication Dose Route Frequency Provider Last Rate Last Dose  . 0.9 %  sodium chloride infusion   Intravenous Continuous Theressa Millard, MD      . acetaminophen (TYLENOL) tablet 650 mg  650 mg Oral Q6H PRN Theressa Millard, MD   650 mg at 01/14/13 2156   Or  . acetaminophen (TYLENOL) suppository 650 mg  650 mg Rectal Q6H PRN Theressa Millard, MD      . ALPRAZolam Duanne Moron) tablet 0.25 mg  0.25 mg Oral TID PRN Erlene Quan, PA-C      . aspirin tablet 325 mg  325 mg Oral Daily Theressa Millard, MD   325 mg at 01/14/13 1057  . clonazePAM (KLONOPIN) tablet 2 mg  2 mg Oral QHS Theressa Millard, MD   2 mg at 01/14/13 2154  . enoxaparin (LOVENOX) injection 40 mg  40 mg Subcutaneous Daily Theressa Millard, MD   40 mg at 01/14/13 1058  . HYDROmorphone (DILAUDID) injection 0.5-1 mg  0.5-1 mg Intravenous Q3H PRN Theressa Millard, MD      . isosorbide mononitrate (IMDUR) 24 hr tablet 60 mg  60 mg Oral Daily Mihai Croitoru, MD   60 mg at 01/14/13 1057  . nitroGLYCERIN (NITROSTAT) SL tablet 0.4 mg  0.4 mg Sublingual Q5 min PRN Theressa Millard, MD      . ondansetron (ZOFRAN) tablet 4 mg  4 mg Oral Q6H PRN Theressa Millard, MD       Or  . ondansetron (ZOFRAN) injection 4 mg  4 mg Intravenous Q6H PRN Theressa Millard, MD      .  oxyCODONE (Oxy IR/ROXICODONE) immediate release tablet 5 mg  5 mg Oral Q4H PRN Theressa Millard, MD      . ranolazine (RANEXA) 12 hr tablet 500 mg  500 mg Oral BID Sanda Klein, MD   500 mg at 01/14/13 2153  . sodium chloride 0.9 % injection 3 mL  3 mL Intravenous Q12H Theressa Millard, MD   3 mL at 01/14/13 1100  . traMADol (ULTRAM) tablet 50 mg  50 mg Oral Q6H PRN Erlene Quan, PA-C      . zolpidem (AMBIEN) tablet 5 mg  5 mg Oral QHS PRN Theressa Millard, MD        PE: General appearance: alert, cooperative and no distress Lungs: clear to auscultation bilaterally Heart: regular rate and rhythm Extremities: no LEE Pulses: 2+ and symmetric Skin: warm and dry Neurologic: Grossly normal  Lab Results:   Recent Labs  01/14/13 0205  WBC 9.3  HGB 15.3  HCT 42.9  PLT 227   BMET  Recent Labs  01/14/13 0205  NA 137  K 3.9  CL 102  CO2 24  GLUCOSE 116*  BUN 15  CREATININE  1.00  CALCIUM 9.3    Cardiac Panel (last 3 results)  Recent Labs  01/14/13 0152 01/14/13 0805 01/14/13 1250  TROPONINI 0.74* 0.68* 0.93*    Assessment/Plan  Principal Problem:   Unstable angina Active Problems:   CAD-CABG 2002 (LIMA-LAD, VG-ramus intermediate, SVG-OM, SVG-PDA) s/p Cath 11/29/12-total occulsion of SVG-OM, EF of 45%   OSA on CPAP   Cardiac pacemaker, 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 He had an episode of SSCP last PM. Imdur was doubled yesterday to 60 mg daily. 500 mg Ranexa BID was also added. + troponin with peak of 0.93 yesterday. Need to consider IV heparin. Keep on high dose ASA. Dr. Ellyn Hack to review old cath films from 2/19. He will determine continued medical therapy vs. PCI + stenting.     LOS: 2 days    Brittainy M. Ladoris Gene 01/15/2013 9:23 AM  I have seen and evaluated the patient this PM along with Ellen Henri, PA. I agree with her findings, examination as well as impression recommendations.  57 y/o man well known to me -- recent cath with new SVG-OM occlusion & progression of disease in proximal LAD.  Plan was medical therapy.  I saw him a few weeks ago & he was doing well.  Had a brief episode of SSCP with nausea on Friday PM (resolved after several doses of NTG), but when the Sx recurred (worst he has had) on Saturday, he came in for evaluation.  An EMT friend checked his BP & HR prior to his coming to ER -- HR was in 160s with SBP in 190s.   He had 2 minor episodes of CP o/n & has ruled in for a small NSTEMI.  I have reviewed his films.  At this point, I think the best course of action is to start with a Lexiscan Cardiolite ST to localize ischemia, as one cannot be sure what the culprit may be.  ? If we should consider proximal LAD PCI (which would potentially need Rotablator, vs. Attempt to re-open SVG-OM, or is SVG-RCA now causing problems.    Will re-start his ACE-I & BB dose along with increased IMDUR & Ranexa.  Will also check proBNP, as I suspect that he may have high filling pressures & would benefit from diuresis.  Leonie Man, M.D., M.S. [facility] [address] [place], [place]  [address]  [phone] Pager # [phone] 01/15/2013 1:40 PM

## 2013-01-15 NOTE — Progress Notes (Signed)
ANTICOAGULATION CONSULT NOTE - Initial Consult  Pharmacy Consult for Lovenox Indication: ACS  Allergies  Allergen Reactions  . Crestor (Rosuvastatin)     Muscle aches  . Zocor (Simvastatin)     Muscle aches    Patient Measurements: Height: 5\' 10"  (177.8 cm) Weight: 262 lb 4.8 oz (118.978 kg) IBW/kg (Calculated) : 73   Vital Signs: Temp: 97.7 F (36.5 C) (04/07 0500) Temp src: Oral (04/07 0500) BP: 157/91 mmHg (04/07 0500) Pulse Rate: 71 (04/07 0500)  Labs:  Recent Labs  01/14/13 0152 01/14/13 0205 01/14/13 0805 01/14/13 1250  HGB  --  15.3  --   --   HCT  --  42.9  --   --   PLT  --  227  --   --   CREATININE  --  1.00  --   --   TROPONINI 0.74*  --  0.68* 0.93*    Estimated Creatinine Clearance: 105.4 ml/min (by C-G formula based on Cr of 1).   Medical History: Past Medical History  Diagnosis Date  . Coronary artery disease   . Anginal pain   . CAD (coronary artery disease), with CABG 2002 LIMA-LAD, VG-ramus intermediate, SVG-OM, SVG-PDA 11/28/2012  . CAD (coronary artery disease) of artery bypass graft, with stent to VG-OM-(BMS) 11/28/2012  . OSA on CPAP 11/28/2012  . 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 Tremor, essential, treated with propranolol 11/28/2012  . HTN (hypertension) 11/28/2012  . Dyslipidemia, with intolerance to Crestor and zocor and possible cough with Lipitor 11/28/2012  . Back pain, chronic 11/28/2012    Medications:  Prescriptions prior to admission  Medication Sig Dispense Refill  . atorvastatin (LIPITOR) 20 MG tablet Take 20 mg by mouth daily at 6 PM.      . clonazePAM (KLONOPIN) 2 MG tablet Take 2 mg by mouth at bedtime.      . clopidogrel (PLAVIX) 75 MG tablet Take 1 tablet (75 mg total) by mouth daily with breakfast.  30 tablet  0  . HYDROcodone-acetaminophen (NORCO/VICODIN) 5-325 MG per tablet Take 1 tablet by mouth every 8 (eight) hours as needed for pain. For pain      .  isosorbide mononitrate (IMDUR) 30 MG 24 hr tablet Take 1 tablet (30 mg total) by mouth daily.  30 tablet  5  . lisinopril (PRINIVIL,ZESTRIL) 20 MG tablet Take 20 mg by mouth daily.      . nitroGLYCERIN (NITROSTAT) 0.4 MG SL tablet Place 0.4 mg under the tongue every 5 (five) minutes x 3 doses as needed for chest pain. x3 doses as needed for chest pain      . propranolol (INDERAL) 20 MG tablet Take 1 tablet (20 mg total) by mouth 2 (two) times daily.  60 tablet  5  . QUEtiapine (SEROQUEL) 50 MG tablet Take 50 mg by mouth daily as needed (for sleep). For sleep      . traZODone (DESYREL) 100 MG tablet Take 100 mg by mouth at bedtime.      . [DISCONTINUED] atorvastatin (LIPITOR) 20 MG tablet Take 1 tablet (20 mg total) by mouth daily at 6 PM.  30 tablet  5  . [DISCONTINUED] nitroGLYCERIN (NITROSTAT) 0.4 MG SL tablet Place 1 tablet (0.4 mg total) under the tongue every 5 (five) minutes x 3 doses as needed for chest pain.  25 tablet  5   Scheduled:  . aspirin  325 mg Oral Daily  . clonazePAM  2 mg Oral QHS  .  isosorbide mononitrate  60 mg Oral Daily  . lisinopril  10 mg Oral Daily  . propranolol  20 mg Oral BID  . ranolazine  500 mg Oral BID  . sodium chloride  3 mL Intravenous Q12H  . [DISCONTINUED] enoxaparin (LOVENOX) injection  40 mg Subcutaneous Daily    Assessment: 57 yo male with ACS.  Episode of SSCP of last night.  No bleeding reported.  Lovenox VTE prophylaxis 40mg  SQ dose given at 09:25 AM .  PLTC = 227K , H/H 15.3/42.9 yesterday.Troponin with peak of 0.93 yesterday.  Cardiology continuing high dose ASA. SCr =1.0,   CrCl ~ 105 ml/min   Goal of Therapy:  Monitor platelets by anticoagulation protocol: Yes   Plan:  1.  Give Lovenox 80mg  SQ x 1 now to = total 120mg  Lovenox SQ .   2.  Lovenox 120 mg SQ q12h  3.  CBC qAM  Nicole Cella, RPh Clinical Pharmacist Pager: [phone] 01/15/2013,12:44 PM

## 2013-01-16 ENCOUNTER — Inpatient Hospital Stay (HOSPITAL_COMMUNITY): Payer: Medicare PPO

## 2013-01-16 ENCOUNTER — Other Ambulatory Visit: Payer: Self-pay

## 2013-01-16 ENCOUNTER — Ambulatory Visit (HOSPITAL_COMMUNITY): Payer: Medicare PPO

## 2013-01-16 MED ORDER — SODIUM CHLORIDE 0.9 % IV SOLN: 250.0000 mL | INTRAVENOUS | Status: DC | PRN

## 2013-01-16 MED ORDER — SODIUM CHLORIDE 0.9 % IJ SOLN: 3.0000 mL | INTRAMUSCULAR | Status: DC | PRN

## 2013-01-16 MED ORDER — TECHNETIUM TC 99M SESTAMIBI GENERIC - CARDIOLITE
10.0000 | Freq: Once | INTRAVENOUS | Status: AC | PRN
  Administered 2013-01-16: 10 via INTRAVENOUS

## 2013-01-16 MED ORDER — ASPIRIN 81 MG PO CHEW
324.0000 mg | CHEWABLE_TABLET | ORAL | Status: AC
  Administered 2013-01-17: 324 mg via ORAL
  Filled 2013-01-16: qty 4

## 2013-01-16 MED ORDER — SODIUM CHLORIDE 0.9 % IJ SOLN
3.0000 mL | Freq: Two times a day (BID) | INTRAMUSCULAR | Status: DC
  Administered 2013-01-17: 3 mL via INTRAVENOUS

## 2013-01-16 MED ORDER — SODIUM CHLORIDE 0.9 % IV SOLN: INTRAVENOUS | Status: DC

## 2013-01-16 MED ORDER — REGADENOSON 0.4 MG/5ML IV SOLN
0.4000 mg | Freq: Once | INTRAVENOUS | Status: AC
  Administered 2013-01-16: 0.4 mg via INTRAVENOUS

## 2013-01-16 MED ORDER — TECHNETIUM TC 99M SESTAMIBI GENERIC - CARDIOLITE
30.0000 | Freq: Once | INTRAVENOUS | Status: AC | PRN
  Administered 2013-01-16: 30 via INTRAVENOUS

## 2013-01-16 NOTE — Progress Notes (Signed)
Patient refused CPAP at this time. Explained to Patient and Family member that if anytime he changed his mind we could hook him up to his CPAP all night. RN aware

## 2013-01-16 NOTE — Plan of Care (Signed)
Problem: Phase II Progression Outcomes Goal: Stress Test if indicated Outcome: Progressing Scheduled for 4/8

## 2013-01-16 NOTE — Progress Notes (Addendum)
Subjective:  No chest pain last night  Objective:  Vital Signs in the last 24 hours: Temp:  [97.7 F (36.5 C)-98.3 F (36.8 C)] 97.7 F (36.5 C) (04/08 0631) Pulse Rate:  [66-76] 66 (04/08 0631) Resp:  [18] 18 (04/08 0631) BP: (129-138)/(82-92) 129/92 mmHg (04/08 0631) SpO2:  [97 %-98 %] 98 % (04/08 0631)  Intake/Output from previous day:  Intake/Output Summary (Last 24 hours) at 01/16/13 0934 Last data filed at 01/16/13 [id number]  Gross per 24 hour  Intake      3 ml  Output   1100 ml  Net  -1097 ml    Physical Exam: General appearance: alert, cooperative, no distress and moderately obese Lungs: clear to auscultation bilaterally Heart: regular rate and rhythm   Rate: 80  Rhythm: normal sinus rhythm  Lab Results:  Recent Labs  01/14/13 0205  WBC 9.3  HGB 15.3  PLT 227    Recent Labs  01/14/13 0205  NA 137  K 3.9  CL 102  CO2 24  GLUCOSE 116*  BUN 15  CREATININE 1.00    Recent Labs  01/14/13 0805 01/14/13 1250  TROPONINI 0.68* 0.93*   Hepatic Function Panel  Recent Labs  01/14/13 0205  PROT 7.0  ALBUMIN 3.7  AST 21  ALT 17  ALKPHOS 55  BILITOT 0.9   No results found for this basename: CHOL,  in the last 72 hours No results found for this basename: INR,  in the last 72 hours  Imaging: Imaging results have been reviewed  Cardiac Studies:  Assessment/Plan:   Principal Problem:   NSTEMI (non-ST elevated myocardial infarction) Active Problems:   CAD-CABG 2002 (LIMA-LAD, VG-ramus intermediate, SVG-OM, SVG-PDA) s/p Cath 11/29/12-total occulsion of SVG-OM, EF of 45%   Cardiac pacemaker, 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 Darcus Pester who has a H/O remote CABG and cath just 2 months ago, Admitted with CP. Mild increase in troponin. Exam benign. Plan lexiscan myoview to risk stratify and determine potential territory of ischemia given recent cath per Dr. Ellyn Hack.   Lorretta Harp 01/16/2013 1:56 PM  I have reviewed myoview which showed moderate inferolateral wall ischemia. I have also reviewed cath films from 2 months ago. This is in the LCX distribution and there is high grade AV grove DX as well as compromise to a co-dominant PDA. I believe that he will need re-look cath tomorrow. I have discussed this with the pt who is agreeable.  Lorretta Harp, M.D., [place]. [place], [place]  [address]  [phone] 01/16/2013 5:51 PM

## 2013-01-17 ENCOUNTER — Encounter (HOSPITAL_COMMUNITY)
Admission: AD | Disposition: A | Discharge status: Home / Self Care | Payer: Self-pay | Source: Other Acute Inpatient Hospital | Attending: Cardiovascular Disease

## 2013-01-17 HISTORY — PX: LEFT HEART CATHETERIZATION WITH CORONARY/GRAFT ANGIOGRAM: SHX5450

## 2013-01-17 LAB — CBC
HCT: 44.2 % (ref 39.0–52.0)
MCV: 81.5 fL (ref 78.0–100.0)
Platelets: 237 10*3/uL (ref 150–400)
RBC: 5.42 MIL/uL (ref 4.22–5.81)
WBC: 8.8 10*3/uL (ref 4.0–10.5)

## 2013-01-17 SURGERY — LEFT HEART CATHETERIZATION WITH CORONARY/GRAFT ANGIOGRAM
Anesthesia: LOCAL

## 2013-01-17 MED ORDER — PROPRANOLOL HCL 40 MG PO TABS
40.0000 mg | ORAL_TABLET | Freq: Three times a day (TID) | ORAL | Status: DC
  Administered 2013-01-17 – 2013-01-18 (×2): 40 mg via ORAL
  Filled 2013-01-17 (×6): qty 1

## 2013-01-17 MED ORDER — FENTANYL CITRATE 0.05 MG/ML IJ SOLN
INTRAMUSCULAR | Status: AC
  Filled 2013-01-17: qty 2

## 2013-01-17 MED ORDER — BIVALIRUDIN 250 MG IV SOLR
INTRAVENOUS | Status: AC
  Filled 2013-01-17: qty 250

## 2013-01-17 MED ORDER — ACETAMINOPHEN 325 MG PO TABS
ORAL_TABLET | ORAL | Status: AC
  Filled 2013-01-17: qty 2

## 2013-01-17 MED ORDER — SODIUM CHLORIDE 0.9 % IV SOLN
0.2500 mg/kg/h | INTRAVENOUS | Status: DC
  Filled 2013-01-17: qty 250

## 2013-01-17 MED ORDER — MORPHINE SULFATE 2 MG/ML IJ SOLN
2.0000 mg | INTRAMUSCULAR | Status: DC | PRN
Start: 2013-01-17 — End: 2013-01-18

## 2013-01-17 MED ORDER — LIDOCAINE HCL (PF) 1 % IJ SOLN
INTRAMUSCULAR | Status: AC
  Filled 2013-01-17: qty 30

## 2013-01-17 MED ORDER — MIDAZOLAM HCL 2 MG/2ML IJ SOLN
INTRAMUSCULAR | Status: AC
  Filled 2013-01-17: qty 2

## 2013-01-17 MED ORDER — HEPARIN (PORCINE) IN NACL 2-0.9 UNIT/ML-% IJ SOLN
INTRAMUSCULAR | Status: AC
  Filled 2013-01-17: qty 1000

## 2013-01-17 MED ORDER — SODIUM CHLORIDE 0.9 % IV SOLN
INTRAVENOUS | Status: DC
  Administered 2013-01-17 (×2): via INTRAVENOUS

## 2013-01-17 MED ORDER — ASPIRIN EC 81 MG PO TBEC
81.0000 mg | DELAYED_RELEASE_TABLET | Freq: Once | ORAL | Status: DC
  Filled 2013-01-17: qty 1

## 2013-01-17 MED ORDER — NITROGLYCERIN IN D5W 200-5 MCG/ML-% IV SOLN
2.0000 ug/min | INTRAVENOUS | Status: DC
  Filled 2013-01-17: qty 250

## 2013-01-17 MED ORDER — MORPHINE SULFATE 2 MG/ML IJ SOLN: 2.0000 mg | INTRAMUSCULAR | Status: DC | PRN

## 2013-01-17 MED ORDER — NITROGLYCERIN IN D5W 200-5 MCG/ML-% IV SOLN
INTRAVENOUS | Status: AC
  Filled 2013-01-17: qty 250

## 2013-01-17 MED ORDER — TICAGRELOR 90 MG PO TABS
ORAL_TABLET | ORAL | Status: AC
  Administered 2013-01-18: 09:00:00 90 mg via ORAL
  Filled 2013-01-17: qty 2

## 2013-01-17 MED ORDER — RANOLAZINE ER 500 MG PO TB12
1000.0000 mg | ORAL_TABLET | Freq: Two times a day (BID) | ORAL | Status: DC
  Administered 2013-01-17 – 2013-01-18 (×2): 1000 mg via ORAL
  Filled 2013-01-17 (×4): qty 2

## 2013-01-17 MED ORDER — ASPIRIN EC 81 MG PO TBEC
81.0000 mg | DELAYED_RELEASE_TABLET | Freq: Every day | ORAL | Status: DC
  Administered 2013-01-18: 81 mg via ORAL
  Filled 2013-01-17: qty 1

## 2013-01-17 MED ORDER — TICAGRELOR 90 MG PO TABS
90.0000 mg | ORAL_TABLET | Freq: Two times a day (BID) | ORAL | Status: DC
  Administered 2013-01-17: 90 mg via ORAL
  Filled 2013-01-17 (×3): qty 1

## 2013-01-17 MED ORDER — ONDANSETRON HCL 4 MG/2ML IJ SOLN
INTRAMUSCULAR | Status: AC
  Filled 2013-01-17: qty 2

## 2013-01-17 MED ORDER — ONDANSETRON HCL 4 MG/2ML IJ SOLN: 4.0000 mg | Freq: Four times a day (QID) | INTRAMUSCULAR | Status: DC | PRN

## 2013-01-17 MED ORDER — ACETAMINOPHEN 325 MG PO TABS
650.0000 mg | ORAL_TABLET | ORAL | Status: DC | PRN
  Administered 2013-01-17: 650 mg via ORAL

## 2013-01-17 NOTE — Progress Notes (Signed)
Pt refuses CPAP tonight. Informed pt to notify if he was to change his mind. RT will continue to monitor.

## 2013-01-17 NOTE — CV Procedure (Signed)
Cardiac Catheterization/Complex PCI/Angiosculpt/Stenting of diffuse LCX stenosis.  [name], 57 y.o., male  Full note dictated; see diagram in chart  DICTATION # [id number], [id number]  AO: 130/84 LV: 130/12/20  LM: smooth 20 - 30% proximal stenosis LAD: 70% prox, occluded post S1 RI: 70% prox LCX: 50% prox, diffuse 70% mid and 80% mid-distal AV groove with old occluded marginal vessels and collaterals to marginals via SVG to RI RCA: old mid occlusion  LIMA to LAD: patent SVG to RI: patent with collaterals to 2 marginal vessels SVG to OM: old occlusion at near ostial stent SVG to RCA patent stent with 30 - 40% prox stenosis.  EF 40% with anterolateral hypokinesis  Successful complex PCI to diffuse AV groove LCX stenosis with 2.5 x 15 extensive Angiosculpt, 2.75 x 38 and 3.0 x 18 Xience Xpedition stents post dilated with 3.25 x 20 [place] to 3.15 to 2.84 taper and stenosis to 0. Attempt at crossing occluded OM stent with moderate support Choice PT not successful.  Angiomax, 180 mg Brilinta, IC/IV NTG  Troy Sine, MD, [facility] 01/17/2013 6:27 PM

## 2013-01-17 NOTE — Progress Notes (Signed)
Post PCI check:  Feels well; mild headache from NTG. Stable hemodynamics. No chest pain or SOB. No hematoma at groin site. Reviewed results of procedure with patient and wife. KELLY,THOMAS A 01/17/2013 6:46 PM

## 2013-01-17 NOTE — Progress Notes (Signed)
Site area: right groin  Site Prior to Removal:  Level 0  Pressure Applied For 20 MINUTES    Minutes Beginning at [place]:   yes  Patient Status During Pull:  AAO X 4  Post Pull Groin Site:  Level 0  Post Pull Instructions Given:  yes  Post Pull Pulses Present:  yes  Dressing Applied:  yes  Comments:  Tolerated procedure well

## 2013-01-17 NOTE — Progress Notes (Signed)
[facility]  Subjective: No complaints. No chest pain.  Objective: Vital signs in last 24 hours: Temp:  [98.1 F (36.7 C)-98.7 F (37.1 C)] 98.1 F (36.7 C) (04/09 0500) Pulse Rate:  [64-75] 71 (04/09 0929) Resp:  [18] 18 (04/09 0500) BP: (126-158)/(79-99) 158/99 mmHg (04/09 0928) SpO2:  [93 %-98 %] 96 % (04/09 0500) Weight:  [257 lb 6.4 oz (116.756 kg)] 257 lb 6.4 oz (116.756 kg) (04/09 0500) Last BM Date: 01/16/13  Intake/Output from previous day: 04/08 0701 - 04/09 0700 In: -  Out: 800 [Urine:800] Intake/Output this shift:    Medications Current Facility-Administered Medications  Medication Dose Route Frequency Provider Last Rate Last Dose  . 0.9 %  sodium chloride infusion   Intravenous Continuous Harvette C Jenkins, MD      . 0.9 %  sodium chloride infusion  250 mL Intravenous PRN Tarri Fuller, PA-C      . 0.9 %  sodium chloride infusion   Intravenous Continuous Tarri Fuller, PA-C      . acetaminophen (TYLENOL) tablet 650 mg  650 mg Oral Q6H PRN Theressa Millard, MD   650 mg at 01/17/13 [id number]   Or  . acetaminophen (TYLENOL) suppository 650 mg  650 mg Rectal Q6H PRN Theressa Millard, MD      . ALPRAZolam Duanne Moron) tablet 0.25 mg  0.25 mg Oral TID PRN Erlene Quan, PA-C      . aspirin tablet 325 mg  325 mg Oral Daily Theressa Millard, MD   325 mg at 01/16/13 1100  . clonazePAM (KLONOPIN) tablet 2 mg  2 mg Oral QHS Theressa Millard, MD   2 mg at 01/16/13 2206  . enoxaparin (LOVENOX) injection 120 mg  120 mg Subcutaneous Q12H Arman Bogus, RPH   120 mg at 01/16/13 1100  . HYDROmorphone (DILAUDID) injection 0.5-1 mg  0.5-1 mg Intravenous Q3H PRN Theressa Millard, MD      . isosorbide mononitrate (IMDUR) 24 hr tablet 60 mg  60 mg Oral Daily Mihai Croitoru, MD   60 mg at 01/17/13 0928  . lisinopril (PRINIVIL,ZESTRIL) tablet 10 mg  10 mg Oral Daily Leonie Man, MD   10 mg at 01/17/13 [id number]  . nitroGLYCERIN (NITROSTAT) SL tablet 0.4  mg  0.4 mg Sublingual Q5 min PRN Theressa Millard, MD      . ondansetron (ZOFRAN) tablet 4 mg  4 mg Oral Q6H PRN Theressa Millard, MD       Or  . ondansetron (ZOFRAN) injection 4 mg  4 mg Intravenous Q6H PRN Theressa Millard, MD      . oxyCODONE (Oxy IR/ROXICODONE) immediate release tablet 5 mg  5 mg Oral Q4H PRN Theressa Millard, MD      . propranolol (INDERAL) tablet 20 mg  20 mg Oral BID Leonie Man, MD   20 mg at 01/17/13 0929  . ranolazine (RANEXA) 12 hr tablet 500 mg  500 mg Oral BID Sanda Klein, MD   500 mg at 01/17/13 0929  . sodium chloride 0.9 % injection 3 mL  3 mL Intravenous Q12H Theressa Millard, MD   3 mL at 01/15/13 2141  . sodium chloride 0.9 % injection 3 mL  3 mL Intravenous Q12H Tarri Fuller, PA-C   3 mL at 01/17/13 0932  . sodium chloride 0.9 % injection 3 mL  3 mL Intravenous PRN Tarri Fuller, PA-C      . traMADol Veatrice Bourbon) tablet  50 mg  50 mg Oral Q6H PRN Erlene Quan, PA-C      . zolpidem (AMBIEN) tablet 5 mg  5 mg Oral QHS PRN Theressa Millard, MD   5 mg at 01/16/13 2206    PE: General appearance: alert, cooperative and no distress Lungs: clear to auscultation bilaterally Heart: regular rate and rhythm Extremities: no LEE Pulses: 2+ and symmetric Skin: warm and dry Neurologic: Grossly normal  Lab Results:   Recent Labs  01/17/13 0535  WBC 8.8  HGB 15.1  HCT 44.2  PLT 237   BMET No results found for this basename: NA, K, CL, CO2, GLUCOSE, BUN, CREATININE, CALCIUM,  in the last 72 hours PT/INR  Recent Labs  01/17/13 0535  LABPROT 13.2  INR 1.01    Assessment/Plan  Principal Problem:   NSTEMI (non-ST elevated myocardial infarction) Active Problems:   CAD-CABG 2002 (LIMA-LAD, VG-ramus intermediate, SVG-OM, SVG-PDA) s/p Cath 11/29/12-total occulsion of SVG-OM, EF of 45%   OSA on CPAP   Cardiac pacemaker, 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 Claiborne Billings. HR and BP both stable. INR is 1.01. Currently CP free. He has been NPO since midnight.     LOS: 4 days    Brittainy M. Ladoris Gene 01/17/2013 10:06 AM   Patient seen and examined. Agree with assessment and plan. I have reviewed cines from 2011 and most recently 11/2012 with both Drs. Ellyn Hack and Gwenlyn Found. Nuclear study is positive for pharmacologically induced ischemia in the lateral wall. Wall motion is within normal limits. The calculated ejection  fraction is 55%. Will titrate beta-blocker for improved effect, he prefers propanolol since it is better for his tremors. Titrate ranolazine to 1000 mg bid. Plan repeat cath today.    Troy Sine, MD, [facility] 01/17/2013 10:39 AM

## 2013-01-18 LAB — BASIC METABOLIC PANEL
BUN: 18 mg/dL (ref 6–23)
GFR calc non Af Amer: 73 mL/min — ABNORMAL LOW (ref >90)
Glucose, Bld: 97 mg/dL (ref 70–99)
Potassium: 4.1 mEq/L (ref 3.5–5.1)

## 2013-01-18 LAB — CBC
HCT: 42.8 % (ref 39.0–52.0)
Hemoglobin: 15.2 g/dL (ref 13.0–17.0)
MCHC: 35.5 g/dL (ref 30.0–36.0)

## 2013-01-18 MED ORDER — TICAGRELOR 90 MG PO TABS: 90.0000 mg | ORAL_TABLET | Freq: Two times a day (BID) | ORAL | Status: DC

## 2013-01-18 MED ORDER — RANOLAZINE ER 1000 MG PO TB12: 1000.0000 mg | ORAL_TABLET | Freq: Two times a day (BID) | ORAL | Status: DC

## 2013-01-18 MED ORDER — ASPIRIN 81 MG PO TBEC: 81.0000 mg | DELAYED_RELEASE_TABLET | Freq: Every day | ORAL | Status: DC

## 2013-01-18 MED ORDER — PAROXETINE HCL 20 MG PO TABS: 20.0000 mg | ORAL_TABLET | ORAL | Status: DC

## 2013-01-18 MED ORDER — PROPRANOLOL HCL 40 MG PO TABS: 40.0000 mg | ORAL_TABLET | Freq: Three times a day (TID) | ORAL | Status: DC

## 2013-01-18 MED ORDER — ISOSORBIDE MONONITRATE ER 60 MG PO TB24: 60.0000 mg | ORAL_TABLET | Freq: Every day | ORAL | Status: DC

## 2013-01-18 MED ORDER — LISINOPRIL 20 MG PO TABS: 20.0000 mg | ORAL_TABLET | Freq: Every day | ORAL | Status: DC

## 2013-01-18 MED FILL — Sodium Chloride IV Soln 0.9%: INTRAVENOUS | Qty: 50 | Status: AC

## 2013-01-18 NOTE — Care Management Note (Unsigned)
    Page 1 of 1   01/18/2013     11:00:30 AM   CARE MANAGEMENT NOTE 01/18/2013  Patient:  [name], [name]   Account Number:  [id number]  Date Initiated:  01/15/2013  Documentation initiated by:  Lizabeth Leyden  Subjective/Objective Assessment:   Patient admitted with chest pain positive trop. + stress test     Action/Plan:   Progression of care and discharge planning   Anticipated DC Date:  01/18/2013   Anticipated DC Plan:  [place]  CM consult      Choice offered to / List presented to:             Status of service:  In process, will continue to follow Medicare Important Message given?   (If response is "NO", the following Medicare IM given date fields will be blank) Date Medicare IM given:   Date Additional Medicare IM given:    Discharge Disposition:    Per UR Regulation:  Reviewed for med. necessity/level of care/duration of stay  If discussed at [place] of Stay Meetings, dates discussed:    Comments:  01/18/13 [place], RN, BSN, [place] [phone] Spoke with pt and spouse regarding medication assistance with Brilinta 90mg  BID and Ranexa 1000mg .  Ensured pt had Brilinta 30day free card and refill card and gave pt number to enroll in $5 copay offer with Ranexa.  CM called [facility] [place] [place] to ensure medication was in stock.  They have Brilinta but not Ranexa.  CM called [facility] [place]- they have Ranexa 500mg  only.  Pt ok to [facility] that [facility].  01/17/13- 1400- Marvetta Gibbons RN, BSN [phone] Pt with positive stress test for cardiac cath today

## 2013-01-18 NOTE — Progress Notes (Signed)
CARDIAC REHAB PHASE I   PRE:  Rate/Rhythm: 50 SR  BP:  Supine: 164/101  Sitting:   Standing:    SaO2:   MODE:  Ambulation: 1000 ft   POST:  Rate/Rhythm: 79 SR  BP:  Supine:   Sitting: 174/92  Standing:    SaO2:  [id number]  Pt tolerated ambulation well without c/o of cp or SOB. BP up before and after walk. Completed MI and stent education with pt and wife. They voice understanding. Pt declines Outpt. CRP, states that he is physically unable to do. He tried before and it caused him a lot of back pain. Encouraged him to find some type of exercise that he was able to do.   Rodney Langton RN 01/18/2013 9:21 AM   69

## 2013-01-18 NOTE — Discharge Summary (Signed)
Physician Discharge Summary  Patient ID: [name] MRN: [id number] DOB/AGE: Sep 28, 1956 57 y.o.  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 male with an extensive Cardiac history who presented to the ED at [facility] due to complaints of severe 10/10 SSCP described as tightness and squeezing in his chest. The first episode of pain began the night before and he took 4 NTG tabs which only gave him minimal relief, and when he woke up the next AM, he had massive chest pain that was unremitting, and was associated with nausea, and SOB and diaphoresis. He went to the ED at 3:30 PM and was evaluated in the ED and arrangements were made to transfer him to [facility] where his cardiologist is. His ED evaluation was initially negative. Dr. Recardo Rangel of [facility] was the cardiologist who accepted the transfer. On arrival he is painfree.   His cardiac history is significant for CAD S/P CABG, S/P Stent , S/P Medtronic Pacemaker. He was hospitalized and treated by [facility] on 02/18 For Unstable Angina, and had Rangel Diagnostic Cath performed on 02/19 by Dr. Ellyn Hack which revealed severe native CAD, moderate pre-stent stenosis of the SVG-rPDA, recently occluded SVG-OM, w/ inadequate collateral flow. All other grafts were patent. The occluded SVG-OM is thought to be the culprit lesion for the pt's past  chest pain. No lesion was identified that could explain the pt's recent chest pain, thus no PCI was performed. The cath also revealed mildly reduced EF of 45% with mild inferolateral hypokinesis. The decision was made At that time to treat medically. He was to continue on Plavix, an ACE-I, BB, statin and long acting nitrate.    He was placed on NTG paste, O2, ASA beta blocker and plavix.  ACE-I was restarted and increases made to imdur and ranexa.  He first had Rangel lexiscan myoview which revealed induced ischemia in the lateral wall with normal wall motion.  He ruled in for NSTEMI and was taken to the cath lab on 01/17/13(see cath lab report below).  He underwent Successful complex PCI to diffuse AV groove LCX stenosis with 2.5 x 15 extensive Angiosculpt, 2.75 x 38 and 3.0 x 18 Xience Xpedition stents post dilated with 3.25 x 20 [place] Trek to 3.15 to 2.84 taper and stenosis to 0. Attempt at crossing occluded OM stent with moderate support Choice PT not successful.  Lisinopril was increased to 20mg  daily for better BP control.  Paxil was also started.  He was discharged home in sable condition after being seen by Dr. Ellyn Hack.  Follow up was arranged.   Consults: None  Significant Diagnostic Studies:    Cardiac Catheterization/Complex PCI/Angiosculpt/Stenting of diffuse LCX stenosis.  [name], 57 y.o., male  Full note dictated; see diagram in chart  DICTATION # [id number], [id number]  AO: 130/84  LV: 130/12/20  LM: smooth 20 - 30% proximal stenosis  LAD: 70% prox, occluded post S1  RI: 70% prox  LCX: 50% prox, diffuse 70% mid and 80% mid-distal AV groove with old occluded marginal vessels and collaterals to marginals via SVG to RI  RCA: old mid occlusion  LIMA to LAD: patent  SVG to RI: patent with collaterals to 2 marginal vessels  SVG to OM: old occlusion at near ostial stent  SVG to RCA patent stent with 30 - 40% prox stenosis.  EF 40% with anterolateral hypokinesis  Successful complex PCI  to diffuse AV groove LCX stenosis with 2.5 x 15 extensive Angiosculpt, 2.75 x 38 and 3.0 x 18 Xience Xpedition stents post dilated with 3.25 x 20 [place] Trek to 3.15 to 2.84 taper and stenosis to 0. Attempt at crossing occluded OM stent with moderate support Choice PT not successful.  Angiomax, 180 mg Brilinta, IC/IV NTG  Ricardo A, MD, [facility]  01/17/2013  6:27 PM   BMET    Component Value Date/Time   NA 134* 01/18/2013 0650   K 4.1 01/18/2013 0650   CL 100 01/18/2013 0650   CO2 24 01/18/2013 0650   GLUCOSE 97 01/18/2013 0650   BUN 18 01/18/2013 0650   CREATININE 1.10 01/18/2013 0650   CALCIUM 9.5 01/18/2013 0650   GFRNONAA 73* 01/18/2013 0650   GFRAA 84* 01/18/2013 0650    CBC    Component Value Date/Time   WBC 10.6* 01/18/2013 0650   RBC 5.24 01/18/2013 0650   HGB 15.2 01/18/2013 0650   HCT 42.8 01/18/2013 0650   PLT 224 01/18/2013 0650   MCV 81.7 01/18/2013 0650   MCH 29.0 01/18/2013 0650   MCHC 35.5 01/18/2013 0650   RDW 14.3 01/18/2013 0650   LYMPHSABS 3.2 01/14/2013 0205   MONOABS 0.9 01/14/2013 0205   EOSABS 0.2 01/14/2013 0205   BASOSABS 0.1 01/14/2013 0205      Treatments: See above  Discharge Exam: Blood pressure 174/92, pulse 60, temperature 97.7 F (36.5 C), temperature source Oral, resp. rate 17, height 5\' 10"  (1.778 m), weight 117.5 kg (259 lb 0.7 oz), SpO2 97.00%.   Disposition: 01-Home or Self Care  Discharge Orders   Future Orders Complete By Expires     Diet - low sodium heart healthy  As directed     Discharge instructions  As directed     Comments:      No lifting more than Rangel half gallon of milk or driving for three days.    Increase activity slowly  As directed         Medication List    STOP taking these medications       clopidogrel 75 MG tablet  Commonly known as:  PLAVIX      TAKE these medications       aspirin 81 MG EC tablet  Take 1 tablet (81 mg total) by mouth daily.     atorvastatin 20 MG tablet  Commonly known as:  LIPITOR  Take 20 mg by  mouth daily at 6 PM.     clonazePAM 2 MG tablet  Commonly known as:  KLONOPIN  Take 2 mg by mouth at bedtime.     HYDROcodone-acetaminophen 5-325 MG per tablet  Commonly known as:  NORCO/VICODIN  Take 1 tablet by mouth every 8 (eight) hours as needed for pain. For pain     isosorbide mononitrate 60 MG 24 hr tablet  Commonly known as:  IMDUR  Take 1 tablet (60 mg total) by mouth daily.     lisinopril 20 MG tablet  Commonly known as:  PRINIVIL,ZESTRIL  Take 20 mg by mouth daily.     nitroGLYCERIN 0.4 MG SL tablet  Commonly known as:  NITROSTAT  Place 0.4 mg under the tongue every 5 (five) minutes x 3 doses as needed for chest pain. x3 doses as needed for chest pain     PARoxetine 20 MG tablet  Commonly known as:  PAXIL  Take 1 tablet (20 mg total) by mouth every morning.     propranolol 40 MG tablet  Commonly known as:  INDERAL  Take 1 tablet (40 mg total) by mouth 3 (three) times daily.     QUEtiapine 50 MG tablet  Commonly known as:  SEROQUEL  Take 50 mg by mouth daily as needed (for sleep). For sleep     ranolazine 1000 MG SR tablet  Commonly known as:  RANEXA  Take 1 tablet (1,000 mg total) by mouth 2 (two) times daily.     Ticagrelor 90 MG Tabs tablet  Commonly known as:  BRILINTA  Take 1 tablet (90 mg total) by mouth 2 (two) times daily.     traZODone 100 MG tablet  Commonly known as:  DESYREL  Take 100 mg by mouth at bedtime.           Follow-up Information   Follow up with HAGER, BRYAN, PA-C. (Our office will call you witht he appt date and time.)    Contact information:   [address] [place] [address] [phone]       Signed: Tarri Fuller 01/18/2013, 1:11 PM  I have seen and evaluated the patient this AM along with Tarri Fuller, PA. I agree with his hospital summary.   S/p PCI to Cx yesterday in an attempt to increase in-line for for OM collaterals, but unable to antegrade open the occluded OM. Moderate area of  ischemia noted in the Inferolateral wall on Myoview -- explained by the occluded SVG-OM.   He feels better, no more CP & has ambulated with [facility] this AM. BP is up, agree with increased ACE-I dose.  He is ready for d/c with close f/u with Mr. Samara Snide. On ASA & Brilinta, BB/ACE-I & increased Nitrate along with addition of Ranexa for un- revascularized region.  He notes SSx of stress & anxiety that seem consistent with Social Anxiety Disorder -- will add either Paxil 20 mg or Celexa 20 mg.  Is planning on going on [place] cruise in ~2 weeks, this should be ok, provided that he does not "over-do it"  Phase 2 [facility] consult post d/c.  Leonie Man, M.D., M.S. [facility] [address] [place], [place]  [address]  [phone] Pager # [phone] 01/18/2013 4:49 PM

## 2013-01-18 NOTE — Progress Notes (Signed)
[facility]  Subjective: No complaints  Objective: Vital signs in last 24 hours: Temp:  [97.6 F (36.4 C)-98.4 F (36.9 C)] 97.7 F (36.5 C) (04/10 0825) Pulse Rate:  [60-69] 60 (04/10 0825) Resp:  [17] 17 (04/09 1223) BP: (122-174)/(61-103) 174/92 mmHg (04/10 0825) SpO2:  [94 %-99 %] 97 % (04/10 0825) Weight:  [117.5 kg (259 lb 0.7 oz)] 117.5 kg (259 lb 0.7 oz) (04/10 0017) Last BM Date: 01/16/13  Intake/Output from previous day: 04/09 0701 - 04/10 0700 In: 1500.8 [P.O.:840; I.V.:660.8] Out: 1250 [Urine:1250] Intake/Output this shift: Total I/O In: 240 [P.O.:240] Out: -   Medications Current Facility-Administered Medications  Medication Dose Route Frequency Provider Last Rate Last Dose  . 0.9 %  sodium chloride infusion   Intravenous Continuous Theressa Millard, MD      . 0.9 %  sodium chloride infusion   Intravenous Continuous Troy Sine, MD 100 mL/hr at 01/17/13 2121    . acetaminophen (TYLENOL) tablet 650 mg  650 mg Oral Q6H PRN Theressa Millard, MD   650 mg at 01/17/13 [id number]   Or  . acetaminophen (TYLENOL) suppository 650 mg  650 mg Rectal Q6H PRN Theressa Millard, MD      . acetaminophen (TYLENOL) tablet 650 mg  650 mg Oral Q4H PRN Troy Sine, MD   650 mg at 01/17/13 1614  . ALPRAZolam Duanne Moron) tablet 0.25 mg  0.25 mg Oral TID PRN Erlene Quan, PA-C      . aspirin EC tablet 81 mg  81 mg Oral Daily Troy Sine, MD   81 mg at 01/18/13 0919  . clonazePAM (KLONOPIN) tablet 2 mg  2 mg Oral QHS Theressa Millard, MD   2 mg at 01/17/13 2139  . HYDROmorphone (DILAUDID) injection 0.5-1 mg  0.5-1 mg Intravenous Q3H PRN Theressa Millard, MD      . isosorbide mononitrate (IMDUR) 24 hr tablet 60 mg  60 mg Oral Daily Mihai Croitoru, MD   60 mg at 01/18/13 0919  . lisinopril (PRINIVIL,ZESTRIL) tablet 10 mg  10 mg Oral Daily Leonie Man, MD   10 mg at 01/18/13 0919  . morphine 2 MG/ML injection 2 mg  2 mg Intravenous Q2H PRN Cecilie Kicks, NP      . nitroGLYCERIN (NITROSTAT) SL tablet 0.4 mg  0.4 mg Sublingual Q5 min PRN Theressa Millard, MD      . nitroGLYCERIN 0.2 mg/mL in dextrose 5 % infusion  2-200 mcg/min Intravenous Continuous Troy Sine, MD 4 mL/hr at 01/17/13 1857 13.333 mcg/min at 01/17/13 1857  . ondansetron (ZOFRAN) tablet 4 mg  4 mg Oral Q6H PRN Theressa Millard, MD       Or  . ondansetron (ZOFRAN) injection 4 mg  4 mg Intravenous Q6H PRN Theressa Millard, MD      . ondansetron [facility]) injection 4 mg  4 mg Intravenous Q6H PRN Troy Sine, MD      . oxyCODONE (Oxy IR/ROXICODONE) immediate release tablet 5 mg  5 mg Oral Q4H PRN Theressa Millard, MD      . propranolol (INDERAL) tablet 40 mg  40 mg Oral TID Troy Sine, MD   40 mg at 01/18/13 0919  . ranolazine (RANEXA) 12 hr tablet 1,000 mg  1,000 mg Oral BID Troy Sine, MD   1,000 mg at 01/18/13 0919  . sodium chloride 0.9 % injection 3 mL  3 mL Intravenous  Q12H Theressa Millard, MD   3 mL at 01/15/13 2141  . Ticagrelor (BRILINTA) tablet 90 mg  90 mg Oral BID Troy Sine, MD   90 mg at 01/18/13 0919  . traMADol (ULTRAM) tablet 50 mg  50 mg Oral Q6H PRN Erlene Quan, PA-C      . zolpidem (AMBIEN) tablet 5 mg  5 mg Oral QHS PRN Theressa Millard, MD   5 mg at 01/16/13 2206    PE: General appearance: alert, cooperative and no distress Lungs: clear to auscultation bilaterally Heart: regular rate and rhythm, S1, S2 normal, no murmur, click, rub or gallop Extremities: No LEE Pulses: 2+ and symmetric Skin: Right groin:  no hematoma, ecchymosis.  Mildly tender. Neurologic: Grossly normal  Lab Results:   Recent Labs  01/17/13 0535 01/18/13 0650  WBC 8.8 10.6*  HGB 15.1 15.2  HCT 44.2 42.8  PLT 237 224   BMET  Recent Labs  01/18/13 0650  NA 134*  K 4.1  CL 100  CO2 24  GLUCOSE 97  BUN 18  CREATININE 1.10  CALCIUM 9.5   PT/INR  Recent Labs  01/17/13 0535  LABPROT 13.2  INR 1.01   LM: smooth 20 - 30%  proximal stenosis  LAD: 70% prox, occluded post S1  RI: 70% prox  LCX: 50% prox, diffuse 70% mid and 80% mid-distal AV groove with old occluded marginal vessels and collaterals to marginals via SVG to RI  RCA: old mid occlusion  LIMA to LAD: patent  SVG to RI: patent with collaterals to 2 marginal vessels  SVG to OM: old occlusion at near ostial stent  SVG to RCA patent stent with 30 - 40% prox stenosis.  EF 40% with anterolateral hypokinesis  Successful complex PCI to diffuse AV groove LCX stenosis with 2.5 x 15 extensive Angiosculpt, 2.75 x 38 and 3.0 x 18 Xience Xpedition stents post dilated with 3.25 x 20 [place] Trek to 3.15 to 2.84 taper and stenosis to 0. Attempt at crossing occluded OM stent with moderate support Choice PT not successful.  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  He had successful complex PCI to diffuse AV groove LCX stenosis with 2.5 x 15 extensive Angiosculpt, 2.75 x 38 and 3.0 x 18 Xience Xpedition stents.  Attempted crossing of occluded OM not successful.    Looks stable for DC.  Will titrate lisinopril to 20mg  daily for better BP control.  FU with me or other APP in 7-10 days.  ASA, brilinta, ranexa, imdur, propranolol.   LOS: 5 days    HAGER, BRYAN 01/18/2013 9:30 AM  I have seen and  evaluated the patient this AM along with Tarri Fuller, PA. I agree with his findings, examination as well as impression recommendations.  S/p PCI to Cx yesterday in an  attempt to increase in-line for for OM collaterals, but unable to antegrade open the occluded OM.   He feels better, no more CP & has ambulated with [facility] this AM.  BP is up, agree with increased ACE-I dose.  He is ready for d/c with close f/u with Mr. Samara Snide.  On ASA & Brilinta, BB/ACE-I & increased Nitrate along with addition of Ranexa for un- revascularized region.  He notes SSx of stress & anxiety that seem consistent with Social Anxiety Disorder -- will add either Paxil 20 mg or Celexa 20 mg.  Is planning on going on a cruise in ~2 weeks, this should be ok, provided that he does not "over-do it"  Phase 2 [facility] consult post d/c.  Leonie Man, M.D., M.S. [facility] [address] [place], [place]  [address]  [phone] Pager # [phone] 01/18/2013 10:11 AM

## 2013-01-18 NOTE — Cardiovascular Report (Signed)
NAMEMarland Rangel  KAP, [place] NO.:  [id number]  MEDICAL RECORD NO.:  [id number]  LOCATION:  [id number]                         FACILITY:  [facility]  PHYSICIAN:  Shelva Majestic, M.D.     DATE OF BIRTH:  Dec 31, 1955  DATE OF PROCEDURE:  01/17/2013 DATE OF DISCHARGE:                           CARDIAC CATHETERIZATION   PROCEDURE:  Cardiac catheterization and percutaneous coronary intervention.  INDICATIONS:  [name] is a 57 year old male with known coronary artery disease and underwent CABG revascularization surgery in 2002, at which time, a LIMA graft was placed to the LAD, SVG to the ramus intermedius vessel, SVG to the obtuse marginal vessel and SVG to the PDA branch of the right coronary artery.  The patient is status post prior intervention to the native vessels.  In November 2011, he underwent stenting of the vein graft to the PDA proximally and also underwent stenting in the proximal portion of the graft to the obtuse marginal vessel.  He also has a history of permanent pacemaker implanted in 2006.  His last cardiac catheterization was in February 2014 by Dr. Ellyn Hack.  At that time, he was found to have total occlusion of the graft, which had supplied the marginal vessels.  He did have some collaterals to the marginal vessels arising from the intermediate vessel.  He had a patent LIMA to the LAD, patent graft to the intermediate, and there was some mild disease in the proximal portion of the graft supplying the PDA.  He had been on medical therapy, but not at maximal dosing.  Over this past weekend, he was admitted with severe chest pain.  Troponins were mildly positive.  He underwent a nuclear perfusion study, which demonstrated lateral wall ischemia.  He now presents for cardiac catheterization and possible intervention.  PROCEDURE IN DETAIL:  After premedication with Versed 2 mg plus fentanyl 50 mcg, the patient was prepped and draped in the usual fashion.   His right femoral artery was punctured anteriorly, and initially a 5-French sheath was inserted without difficulty.  Diagnostic catheterization was done utilizing 5-French Judkins 4 left and right coronary catheters into the native vessels.  The right catheter was also used for selective angiography into all 3 vein grafts.  A LIMA catheter was used for selective angiography into left internal mammary artery.  A 5-French pigtail catheter was used for RAO ventriculography.  At this point, I broke scrub and again had reviewed his old films previous to the catheterization with both Dr. Ellyn Hack and Dr. Gwenlyn Found.  I have now reviewed the old films.  I compared them to the present findings.  His current catheterization seemed to not be significantly different with reference to his previous graft disease.  He did have significant disease in his AV groove from proximal circumflex and again had occlusion of his marginal vessels with collaterals.  After much discussion with the patient, concerning further attempt at maximizing medical therapy, versus an attempt at staging possible intervention if medical therapy fails, the patient preferred that I attempt intervention today and preferred to do this in this current setting.  At this point, I did leave the catheterization suite and went overall options with the patient's wife.  She  agreed also for me to proceed with the procedure today after hearing her husband's wishes.  At that point, I again scrubbed for the procedure.  The patient received additional Versed plus fentanyl for additional conscious sedation.  The 5-French sheath was upgraded to a 6-French sheath.  Since the patient had been on Plavix and had developed a graft occlusion on Plavix, I made the decision to load him with 180 mg of Brilinta.  Angiomax bolus plus infusion was also administered.  The initial plan was to improve flow in the proximal and AV groove circumflex, and possibly once  this was significantly improved to see if there was any potential to opening up an occluded marginal vessel.  A Voda 3.5 guide was used.  After documentation of therapeutic anticoagulation, a Prowater wire was able to be advanced down to the distal circumflex vessel.  Due to the diffuse disease in the proximal portion of the vessel prior to the occluded marginal branch with the previously placed stent in the marginal branch and with also a stent placed in the AV groove circumflex just after the marginal branch, there was diffuse disease of 70% beyond this followed by 80% stenosis.  A 2.5 x 15 mm AngioSculpt cutting balloon was used.  Multiple balloon dilatations were made at 6, 6, 7, 6, 6, 6, 7, 8, 9, 10, 9, 10, 10 and 10 atmospheres up and down throughout the entire vessel with excellent scoring.  A 2.75 x 38 mm Xience Xpedition stent was then inserted into the circumflex and advanced to the more distal aspect of the AV groove circumflex to make certain to cover the distal 80% stenosis at a site where also distal marginal was occluded.  The Xience Xpedition 2.75 x 38 mm stent was dilated x2 at 9 and 10 atmospheres.  This stent ended proximally just inferior to the first marginal branch.  A second 3.0 x 18 mm Xience Xpedition stent was then inserted in tendon fashion extending proximally to cover the proximal narrowing.  A 3.25 x 20 mm Gleason trek was used for post stent dilatation in the entire AV groove circumflex with stent taper from 2.84 mm at the most distal aspect of the distally placed stent with progressive dilation up to 3.15 mm proximally.  Scout angiography confirmed an excellent angiographic result in the entire AV groove circumflex system with improved filling of the second marginal vessel.  There was still total occlusion of the first marginal vessel, which had a stent placed ostially, and again there was also total occlusion of the distal third marginal vessel, which also had  collaterals.  At this point, I did make an attempt to see if I was able to potentially open up the first marginal vessel.  A Choice moderate support PT wire was then inserted.  This wire was able to be advanced into the ostium of the marginal vessel where the stent arose, but essentially this was an old occluded vessel and essentially was unable to penetrate the complete occlusion.  At this point, the patient had stable hemodynamics, felt very well and no further attempts at opening up the marginal vessels was made.  Scout angiography confirmed an excellent angiographic result.  The patient left the catheterization laboratory chest pain-free with stable hemodynamics.  He will be kept on Angiomax until the current bag runs out, which would be in approximately 1/2 hour from completion of the procedure.  HEMODYNAMIC DATA:  Central aortic pressure 130/84.  Left ventricular pressure 130/12; post A-wave 20.  ANGIOGRAPHIC DATA:  The left main coronary artery was angiographically normal and trifurcated into an LAD, ramus intermediate vessel, and then the left circumflex vessel.  There was mild 20%-30% smooth tapering of the left main proximally.  The proximal portion of the LAD had diffuse 70% stenosis and then was totally occluded after septal perforating artery.  The intermediate vessel had 70% proximal stenosis.  A filling was seen distally via the vein graft.  The AV groove circumflex had approximately 50%-60% proximal stenosis before the marginal branch, which had an occluded stent.  There was then a stent in the AV groove circumflex after this marginal branch with at least 40%-50% in-stent re-narrowing.  Following this, stent was diffuse 70% stenosis and then an 80% stenosis on a bent in the vessel distally at the site of the third marginal vessel, which was occluded.  The native right coronary artery was totally occluded in its mid segment.  The LIMA graft supplying the LAD was  widely patent.  There was filling of the LAD back up to the point of total proximal occlusion and filling to the apex.  Remainder of the LAD was free of significant disease.  The graft supplying the intermediate vessel was normal.  There was also collateralization to the marginal vessels via this intermediate vessel.  The vein graft supplying the PDA had a patent stent proximally.  There was mild 30%-40% stenosis at the very origin of the graft.  Following the diffuse intervention to the proximal mid and mid distal AV groove circumflex with extensive AngioSculpt cutting balloon followed by tandem stenting with a 2.75 x 38 mm Xience Xpedition stent inserted distally and 3.0 x 18 mm Xience Xpedition stent extending proximally in overlapping fashion, 56 mm of stent was placed with stent taper from 3.15 proximally to 2.84 distally with the entire region being reduced to 0%.  There was now significant improved flow in the OM2 vessel.  The OM3 vessel remained occluded, but again they were collaterals.  The OM1 vessel, which had a stent at the ostium also remained occluded despite attempt at wiring this vessel.  RAO ventriculography revealed an ejection fraction of approximately 40%. There was mid distal anterolateral hypocontractility.  IMPRESSION: 1. Mild dysfunction with anterolateral hypocontractility. 2. Significant native coronary artery disease with smooth 20%-30%     tapering of the left main ostium; 70% proximal left anterior     descending artery stenosis with total with occlusion of the left     anterior descending artery after the first septal perforating     artery, 70% stenosis at the proximal portion of the ramus     intermedius vessel, diffuse 70% to 80% stenosis in the     atrioventricular groove circumflex with total occlusion of the     obtuse marginal vessel at site of prior stenting and 40%-50%     narrowing in the atrioventricular groove circumflex at the site of      prior stenting with diffuse 70% distal 80% stenosis in the     atrioventricular groove circumflex; total occlusion of the native     mid right coronary artery. 3. Patent left internal mammary artery to the left anterior descending     artery. 4. Patent vein graft to the ramus intermediate vessel with     collateralization to several marginal branches. 5. Total occlusion of the vein graft, which had supplied the marginal     vessel with occlusion at the very proximal segment prior to the  previously placed stent, and patent vein graft supplying the right     coronary artery with mild 30% to less than 40% narrowing     proximally. 6. Difficult but successful intervention involving diffuse stenoses in     the proximal mid and mid distal atrioventricular groove circumflex     with AngioSculpt and ultimate stenting with a 3.0 x 18 mm Xience     Xpedition stent proximally and in tandem fashion with a 2.75 x 38     mm Xience Xpedition stent carrying the remaining portion of the     atrioventricular groove circumflex with stent taper from 3.15     proximally to 2.84 mm distally, and the entire region being reduced     to 0% with continued total occlusion of the prior obtuse marginal     vessel at the site of prior ostial stenting and distal obtuse     marginal vessel both improved filling in the obtuse marginal 2     vessel following atrioventricular groove circumflex intervention.          ______________________________ Shelva Majestic, M.D.     TK/MEDQ  D:  01/17/2013  T:  01/18/2013  Job:  [id number]  cc:   Rolland Porter, MD

## 2013-01-18 NOTE — Progress Notes (Addendum)
01/18/13:  Benefits check... Ranexa will not be covered by insurance unless he has tried similar medications and those failed.  Insurance will need a call from prescribing MD to confirm this medication is next step.  [phone].  Thank you!  Fuller Mandril, RN, BSN, [place] [phone].  [name] is covered by insurance with a $45 co-pay/month.  Pt has refill card through [facility] that offers this co-pay at $18/month.  Pt notified to utilize [facility] card.

## 2013-03-16 ENCOUNTER — Other Ambulatory Visit (HOSPITAL_COMMUNITY): Payer: Self-pay | Admitting: Physician Assistant

## 2013-03-23 ENCOUNTER — Telehealth: Payer: Self-pay | Admitting: *Deleted

## 2013-03-23 MED ORDER — PRASUGREL HCL 10 MG PO TABS: 10.0000 mg | ORAL_TABLET | Freq: Every day | ORAL | Status: DC

## 2013-03-23 NOTE — Addendum Note (Signed)
Addended by: Erasmo Downer R on: 03/23/2013 04:12 PM   Modules accepted: Orders, Medications

## 2013-03-23 NOTE — Telephone Encounter (Signed)
Stop Brilinta.  Start Effient 10mg  daily.

## 2013-03-23 NOTE — Telephone Encounter (Signed)
Returned call and informed pt per instructions by MD/PA.  Pt verbalized understanding and agreed w/ plan.  

## 2013-03-23 NOTE — Telephone Encounter (Signed)
Rx sent to pharmacy   

## 2013-03-23 NOTE — Telephone Encounter (Signed)
Pt stated he was put on Brilinta when he was discharged from the hospital in April.  Pt stated on his way home from the hospital the SOB episodes started.  Pt stated the episodes are brief and last 10-20secs.  Denied CP w/ episodes except last night he had some.  State he didn't have to take NTG.  Pt stated sometimes he has dizzy spells when he gets up x 2-3 days.  Pt stated he had an appt w/ Dr. Ellyn Hack on the 18th, but called to switch it to Monday to see the PA.  Pt informed RN will discuss with MD/PA for further instructions.  Pt verbalized understanding and agreed w/ plan.  Message forwarded to B. Samara Snide, PA-C for further instructions.

## 2013-03-26 ENCOUNTER — Ambulatory Visit (INDEPENDENT_AMBULATORY_CARE_PROVIDER_SITE_OTHER): Payer: Medicare PPO | Admitting: Physician Assistant

## 2013-03-26 ENCOUNTER — Encounter: Payer: Self-pay | Admitting: Physician Assistant

## 2013-03-26 VITALS — BP 130/70 | HR 67 | Ht 70.0 in | Wt 260.6 lb

## 2013-03-26 DIAGNOSIS — I10 Essential (primary) hypertension: Secondary | ICD-10-CM

## 2013-03-26 DIAGNOSIS — E785 Hyperlipidemia, unspecified: Secondary | ICD-10-CM

## 2013-03-26 DIAGNOSIS — I2510 Atherosclerotic heart disease of native coronary artery without angina pectoris: Secondary | ICD-10-CM

## 2013-03-26 NOTE — Progress Notes (Signed)
Date:  03/26/2013   ID:  [name], DOB [birth year], MRN [id number]  PCP:  No primary provider on file.  Primary Cardiologist:  Ellyn Hack     History of Present Illness: [name] is a 57 y.o. male  The patient is a 57 year old overweight Caucasian male with a history of coronary artery disease and coronary bypass grafting. He had a LIMA to the LAD, a VG to the ramus intermediate, SVG to the OM and SVG to the PDA, that was in 2002. He had stenting of both vein grafts to the PDA and the OM in 2011 with bare metal stent. He had a PDA with a VeriFLEX stent. He underwent cardiac catheterization on November 29, 2012 for symptoms of unstable angina. EF at that time was 49% with mild inferolateral hypokinesis. It was noted he had an occluded vein graft to the OM with inadequate collateral flow. There was moderate in-stent restenosis in the vein graft to the RPDA. The LIMA was patent as was the ramus intermediate, there was a significant lesion obstructing the retrograde flow to the other branch of the ramus. He also has hypertension and obstructive sleep apnea on CPAP, a pacemaker implanted for bradycardia which is a Medtronic device that was placed in 2006. He also has an essential tremor(on propranolol), dyslipidemia with intolerance to Crestor, Zocor but is tolerating Lipitor. He has chronic back pain with multiple surgeries. He was recently hospitalized from April 5th to April 10th at which time he had a non-ST-elevation myocardial infarction. He underwent successful complex PCI to a diffuse AV groove circumflex stenosis with extensive AngioSculpt. A Xience Xpedition stent was placed. His lisinopril was increased to 20 mg, Paxil was started, along with Brilinta.    During his last office visit the patient indicated he develop shortness of breath right after starting Brilinta.  However, this resolved after 2-3 days of being on the medicine. Last week the patient called the office  complaining of severe dyspnea so I changed him to Effient.  Patient presented today for followup to that medication change and to evaluate his symptoms.  He states he's been feeling much better since switching. He did note that he passed 2 kidney stones recently which was the likely source of some hematuria.  The patient currently denies nausea, vomiting, fever, chest pain, shortness of breath, orthopnea, dizziness, PND, cough, congestion, abdominal pain, hematochezia, melena, lower extremity edema.   Wt Readings from Last 3 Encounters:  03/26/13 260 lb 9.6 oz (118.207 kg)  01/18/13 259 lb 0.7 oz (117.5 kg)  01/18/13 259 lb 0.7 oz (117.5 kg)     Past Medical History  Diagnosis Date  . Coronary artery disease   . Anginal pain   . CAD (coronary artery disease), with CABG 2002 LIMA-LAD, VG-ramus intermediate, SVG-OM, SVG-PDA 11/28/2012  . CAD (coronary artery disease) of artery bypass graft, with stent to VG-OM-([facility]) 11/28/2012  . OSA on CPAP 11/28/2012  . 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 Tremor, essential, treated with propranolol 11/28/2012  . HTN (hypertension) 11/28/2012  . Dyslipidemia, with intolerance to Crestor and zocor and possible cough with Lipitor 11/28/2012  . Back pain, chronic 11/28/2012    Current Outpatient Prescriptions  Medication Sig Dispense Refill  . aspirin EC 81 MG EC tablet Take 1 tablet (81 mg total) by mouth daily.      Marland Kitchen atorvastatin (LIPITOR) 20 MG tablet Take 20 mg by mouth daily at 6 PM.      .  clonazePAM (KLONOPIN) 2 MG tablet Take 2 mg by mouth at bedtime.      . clopidogrel (PLAVIX) 75 MG tablet Take 75 mg by mouth daily.      Marland Kitchen HYDROcodone-acetaminophen (NORCO/VICODIN) 5-325 MG per tablet Take 1 tablet by mouth every 8 (eight) hours as needed for pain. For pain      . isosorbide mononitrate (IMDUR) 60 MG 24 hr tablet Take 30 mg by mouth daily.      Marland Kitchen lisinopril (PRINIVIL,ZESTRIL) 20 MG tablet Take 20 mg by  mouth daily.      . nitroGLYCERIN (NITROSTAT) 0.4 MG SL tablet Place 0.4 mg under the tongue every 5 (five) minutes x 3 doses as needed for chest pain. x3 doses as needed for chest pain      . PARoxetine (PAXIL) 20 MG tablet TAKE 1 TABLET BY MOUTH EVERY MORNING  30 tablet  5  . prasugrel (EFFIENT) 10 MG TABS Take 1 tablet (10 mg total) by mouth daily.  30 tablet  6  . propranolol (INDERAL) 40 MG tablet Take 1 tablet (40 mg total) by mouth 3 (three) times daily.  90 tablet  5  . QUEtiapine (SEROQUEL) 50 MG tablet Take 50 mg by mouth daily as needed (for sleep). For sleep      . ranolazine (RANEXA) 1000 MG SR tablet Take 1 tablet (1,000 mg total) by mouth 2 (two) times daily.  60 tablet  5  . Ticagrelor (BRILINTA) 90 MG TABS tablet Take 90 mg by mouth 2 (two) times daily.      . traZODone (DESYREL) 100 MG tablet Take 100 mg by mouth at bedtime.       No current facility-administered medications for this visit.    Allergies:    Allergies  Allergen Reactions  . Crestor (Rosuvastatin)     Muscle aches  . Zocor (Simvastatin)     Muscle aches    Social History:  The patient  reports that he has never smoked. He has never used smokeless tobacco. He reports that he does not drink alcohol or use illicit drugs.   History reviewed. No pertinent family history.  ROS:  Please see the history of present illness.  All other systems reviewed and negative.   PHYSICAL EXAM: VS:  BP 130/70  Pulse 67  Ht 5\' 10"  (1.778 m)  Wt 260 lb 9.6 oz (118.207 kg)  BMI 37.39 kg/m2 Well nourished, well developed, in no acute distress HEENT: Pupils are equal round react to light accommodation extraocular movements are intact.  Neck: No cervical lymphadenopathy. Cardiac: Regular rate and rhythm without murmurs rubs or gallops. Lungs:  clear to auscultation bilaterally, no wheezing, rhonchi or rales Abd: soft, nontender, positive bowel sounds all quadrants, no hepatosplenomegaly Ext: no lower extremity edema.  2+  radial and dorsalis pedis pulses. Skin: warm and dry Neuro:  Grossly normal  EKG:  Normal sinus rhythm left axis deviation nonspecific T-wave changes rate of 67 beats per minute   ASSESSMENT AND PLAN:  Problem List Items Addressed This Visit   CAD-CABG 2002 (LIMA-LAD, VG-ramus intermediate, SVG-OM, SVG-PDA) s/p Cath 11/29/12-total occulsion of SVG-OM, EF of 45% - Primary (Chronic)     Patient was switched from Brilinta to Effient last week.  One week's worth of samples were provided as well as a Effient at discount card.    Relevant Medications      isosorbide mononitrate (IMDUR) 60 MG 24 hr tablet   Other Relevant Orders      EKG  12-Lead   Dyslipidemia, with intolerance to Crestor and zocor and possible cough with Lipitor (Chronic)     Currently treated with Lipitor    HTN (hypertension) (Chronic)     Blood pressure is well-controlled at this time.    Relevant Medications      isosorbide mononitrate (IMDUR) 60 MG 24 hr tablet

## 2013-03-26 NOTE — Assessment & Plan Note (Signed)
Blood pressure is well-controlled at this time.

## 2013-03-26 NOTE — Assessment & Plan Note (Signed)
Patient was switched from Brilinta to Effient last week.  One week's worth of samples were provided as well as a Effient at discount card.

## 2013-03-26 NOTE — Patient Instructions (Addendum)
Followup with Dr. Ellyn Hack in 6 months.

## 2013-03-26 NOTE — Assessment & Plan Note (Signed)
Currently treated with Lipitor

## 2013-03-28 ENCOUNTER — Ambulatory Visit: Payer: Self-pay | Admitting: Cardiology

## 2013-04-02 ENCOUNTER — Ambulatory Visit: Payer: Self-pay | Admitting: Physician Assistant

## 2013-07-17 ENCOUNTER — Telehealth: Payer: Self-pay | Admitting: Cardiology

## 2013-07-17 NOTE — Telephone Encounter (Signed)
Pt had to stop taking his heart medicine,he can not afford it.

## 2013-07-17 NOTE — Telephone Encounter (Signed)
Called back no answer left message.

## 2013-09-21 ENCOUNTER — Ambulatory Visit (INDEPENDENT_AMBULATORY_CARE_PROVIDER_SITE_OTHER): Payer: Medicare PPO | Admitting: Cardiology

## 2013-09-21 ENCOUNTER — Ambulatory Visit
Admission: RE | Admit: 2013-09-21 | Discharge: 2013-09-21 | Disposition: A | Discharge status: Home / Self Care | Payer: Medicare PPO | Source: Ambulatory Visit | Attending: Cardiology | Admitting: Cardiology

## 2013-09-21 ENCOUNTER — Ambulatory Visit (INDEPENDENT_AMBULATORY_CARE_PROVIDER_SITE_OTHER): Payer: Medicare PPO | Admitting: *Deleted

## 2013-09-21 ENCOUNTER — Encounter: Payer: Self-pay | Admitting: Cardiology

## 2013-09-21 VITALS — BP 152/102 | HR 75 | Ht 70.0 in | Wt 275.6 lb

## 2013-09-21 DIAGNOSIS — I498 Other specified cardiac arrhythmias: Secondary | ICD-10-CM

## 2013-09-21 DIAGNOSIS — D689 Coagulation defect, unspecified: Secondary | ICD-10-CM

## 2013-09-21 DIAGNOSIS — R0609 Other forms of dyspnea: Secondary | ICD-10-CM

## 2013-09-21 DIAGNOSIS — Z01818 Encounter for other preprocedural examination: Secondary | ICD-10-CM

## 2013-09-21 DIAGNOSIS — R5381 Other malaise: Secondary | ICD-10-CM

## 2013-09-21 DIAGNOSIS — I25810 Atherosclerosis of coronary artery bypass graft(s) without angina pectoris: Secondary | ICD-10-CM

## 2013-09-21 DIAGNOSIS — I2510 Atherosclerotic heart disease of native coronary artery without angina pectoris: Secondary | ICD-10-CM

## 2013-09-21 DIAGNOSIS — G4733 Obstructive sleep apnea (adult) (pediatric): Secondary | ICD-10-CM

## 2013-09-21 DIAGNOSIS — Z950 Presence of cardiac pacemaker: Secondary | ICD-10-CM

## 2013-09-21 DIAGNOSIS — R0789 Other chest pain: Secondary | ICD-10-CM

## 2013-09-21 DIAGNOSIS — E785 Hyperlipidemia, unspecified: Secondary | ICD-10-CM

## 2013-09-21 DIAGNOSIS — I10 Essential (primary) hypertension: Secondary | ICD-10-CM

## 2013-09-21 LAB — CBC
Hemoglobin: 16.8 g/dL (ref 13.0–17.0)
MCHC: 35.4 g/dL (ref 30.0–36.0)
Platelets: 310 10*3/uL (ref 150–400)
RBC: 6.02 MIL/uL — ABNORMAL HIGH (ref 4.22–5.81)

## 2013-09-21 LAB — PROTIME-INR
INR: 0.95 (ref <1.50)
Prothrombin Time: 12.6 seconds (ref 11.6–15.2)

## 2013-09-21 LAB — BASIC METABOLIC PANEL
Glucose, Bld: 91 mg/dL (ref 70–99)
Potassium: 4.5 mEq/L (ref 3.5–5.3)
Sodium: 136 mEq/L (ref 135–145)

## 2013-09-21 LAB — PACEMAKER DEVICE OBSERVATION

## 2013-09-21 MED ORDER — CLONAZEPAM 2 MG PO TABS: 2.0000 mg | ORAL_TABLET | Freq: Every day | ORAL | Status: AC

## 2013-09-21 MED ORDER — NITROGLYCERIN 0.4 MG SL SUBL: 0.4000 mg | SUBLINGUAL_TABLET | SUBLINGUAL | Status: DC | PRN

## 2013-09-21 MED ORDER — TELMISARTAN 40 MG PO TABS: 40.0000 mg | ORAL_TABLET | Freq: Every day | ORAL | Status: DC

## 2013-09-21 MED ORDER — PITAVASTATIN CALCIUM 2 MG PO TABS: 2.0000 mg | ORAL_TABLET | Freq: Every day | ORAL | Status: DC

## 2013-09-21 MED ORDER — PRASUGREL HCL 10 MG PO TABS: 10.0000 mg | ORAL_TABLET | Freq: Every day | ORAL | Status: DC

## 2013-09-21 NOTE — Assessment & Plan Note (Signed)
Elevated on propranolol but no ace or ARB, gave samples see above.

## 2013-09-21 NOTE — Assessment & Plan Note (Signed)
Gave pt samples of livalo for cholesterol, will see if he tolerates.

## 2013-09-21 NOTE — Assessment & Plan Note (Signed)
Uses every night no SOB at night.

## 2013-09-21 NOTE — Assessment & Plan Note (Signed)
Interrogated, rarely atrially paces, mostly at night.  Stable pacer parameters.

## 2013-09-21 NOTE — Patient Instructions (Addendum)
You will have a heart catheterization on Tuesday 12/16 with Dr. Andria Rhein will go to [facility] for this and will be provided with instructions.  You will need to have blood work and a chest xray prior to this. You can go today if you like ([address])   Take effient 10 mg daily.  Take aspirin 81 mg daily.    Use NTG as needed.  Added Micardis for BP, control of BP will help with shortness of breath. Samples given.  Livalo is a cholesterol medicine that is not strong, but should help with cholesterol.  Samples given.

## 2013-09-21 NOTE — Progress Notes (Addendum)
09/21/2013   PCP: No primary provider on file.   Chief Complaint  Patient presents with  . Follow-up    6 month visit - stopped taking all meds in August - telephone doc 10/17 reports patient stopped meds; "bouts of indigestion" more freqeuent - reports is same symptom as when had previous blockage; under stress over last few months; 2-3 weeks indigestion associated with chest pressure; episodes las about 10-25mins; family reports has deteriorated in last month  . Shortness of Breath    cannot go up and down stairs, DOE    Primary Cardiologist: Dr. Ellyn Hack  HPI:  The patient is a 57 year old overweight Caucasian male with a history of coronary artery disease and coronary bypass grafting in 2002. He had a LIMA to the LAD, a VG to the ramus intermediate, SVG to the OM and SVG to the PDA. He had stenting of both vein grafts to the PDA and the OM in 2011 with bare metal stent. He had a PDA with a VeriFLEX stent. He underwent cardiac catheterization on 57/19/14 for symptoms of unstable angina. EF at that time was 49% with mild inferolateral hypokinesis. It was noted he had an occluded vein graft to the OM with inadequate collateral flow. There was moderate in-stent restenosis in the vein graft to the RPDA. The LIMA was patent as was the ramus intermediate, there was a  significant lesion obstructing the retrograde flow to the other branch of the ramus.  He also has hypertension and obstructive sleep apnea on CPAP, a pacemaker implanted for bradycardia which is a Medtronic device that was placed in 2006. He also has an essential tremor(on propranolol), dyslipidemia with intolerance to  Crestor, Zocor but is tolerating Lipitor. He has chronic back pain with multiple surgeries. He was recently hospitalized from April 5th to April 10th at which time he had a non-ST-elevation myocardial infarction. He underwent successful complex PCI to a diffuse AV groove circumflex stenosis with  extensive AngioSculpt. A Xience Xpedition stent DES was placed. His lisinopril was increased to 20 mg, Paxil was started, along with Brilinta.  During his last office visit the patient indicated he develop shortness of breath right after starting Brilinta. However, this resolved after 2-3 days of being on the medicine. Later the patient called the office complaining of severe dyspnea so he was changed o Effient.   Today he presents with increasing DOE.  So severe now that occurs even walking down hill.  He cannot participate in his usual activities.  He does have occ. Chest pressure as well with rest and exertion.  The DOE is greatest complaint.  He does get clammy with the DOE.  He has sleep apnea and has not had to stop using, nor does he have to raise his head for sleep at night.  No nausea or vomiting.    Unfortunately he could not afford his meds.  He is off ASA, Effient, Lisinopril, lipitor, paxil, seroquel, ranexa.  Very concerning for thrombus within DES stents place in April 2014.         Allergies  Allergen Reactions  . Crestor [Rosuvastatin]     Muscle aches  . Zocor [Simvastatin]     Muscle aches    Current Outpatient Prescriptions  Medication Sig Dispense Refill  . aspirin EC 81 MG tablet Take 81 mg by mouth daily.      . clonazePAM (KLONOPIN) 2 MG tablet Take 1 tablet (2 mg total) by mouth  at bedtime.  30 tablet  0  . nitroGLYCERIN (NITROSTAT) 0.4 MG SL tablet Place 1 tablet (0.4 mg total) under the tongue every 5 (five) minutes x 3 doses as needed for chest pain. x3 doses as needed for chest pain  25 tablet  4  . NON FORMULARY at bedtime. CPAP      . Pitavastatin Calcium 2 MG TABS Take 1 tablet (2 mg total) by mouth daily.  14 tablet  0  . prasugrel (EFFIENT) 10 MG TABS tablet Take 1 tablet (10 mg total) by mouth daily.  14 tablet  0  . propranolol (INDERAL) 40 MG tablet Take 1 tablet (40 mg total) by mouth 3 (three) times daily.  90 tablet  5  . telmisartan (MICARDIS) 40 MG  tablet Take 1 tablet (40 mg total) by mouth daily.  14 tablet  0   No current facility-administered medications for this visit.   PATIENT ONY TAKING THE FIRST 3 MEDS  Past Medical History  Diagnosis Date  . Anginal pain   . CAD (coronary artery disease), with CABG 2002 LIMA-LAD, VG-ramus intermediate, SVG-OM, SVG-PDA 11/28/2012  . CAD (coronary artery disease) of artery bypass graft, with stent to VG-OM-([facility]) 11/28/2012  . OSA on CPAP 11/28/2012  . 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 Tremor, essential, treated with propranolol 11/28/2012  . HTN (hypertension) 11/28/2012  . Dyslipidemia, with intolerance to Crestor and zocor and possible cough with Lipitor 11/28/2012  . Back pain, chronic 11/28/2012    Past Surgical History  Procedure Laterality Date  . Coronary artery bypass graft    . Cardiac catheterization  01/2013  . Coronary angioplasty  01/17/13  . Insert / replace / remove pacemaker    . Back surgery      [id number] colds or fevers, weight is up 15 pounds but no edema Skin:no rashes or ulcers HEENT:no blurred vision, no congestion CV:see HPI PUL:see HPI GI:no diarrhea constipation or melena, no indigestion GU:no hematuria, no dysuria MS:no joint pain, no claudication Neuro:no syncope, no lightheadedness Endo:no diabetes, no thyroid disease  PHYSICAL EXAM BP 152/102  Pulse 75  Ht 5\' 10"  (1.778 m)  Wt 275 lb 9.6 oz (125.011 kg)  BMI 39.54 kg/m2 General:Pleasant affect, NAD Skin:Warm and dry, brisk capillary refill HEENT:normocephalic, sclera clear, mucus membranes moist Neck:supple, no JVD, no bruits  Heart:S1S2 RRR without murmur, gallup, rub or click Lungs:clear without rales, rhonchi, or wheezes [id number], soft, non tender, + BS, do not palpate liver spleen or masses Ext:no lower ext edema, 2+ pedal pulses, 2+ radial pulses Neuro:alert and oriented, MAE, follows commands, + facial symmetry  EKG:SR no acute  EKG changes from last visit.  Walking SP02 RA ranged 95-97%.  HR was in the 80-90s and BP post walk 160/104.  Pacer was interrogated, stable has 8 years left on battery life, rarely uses.  Was placed due to pauses during the cath and he does have hx of sleep apnea.   ASSESSMENT AND PLAN DOE (dyspnea on exertion), anginal equivalant Increasing DOE along with episodic chest pressure.  Has continued to increase over several months, now just walking in from waiting room has to stop and catch his breath.  Mild clamminess with DOE.  Has been off ASA and effient both since end of July.  He has DES Xience stent placed in April of this year to av groove.  Unfortunately he did not tolerate Brilinta with SOB and he was changed to Effient, he took  free month supply and was not able to afford more. His asa was stopped for bruising.  Reviewed with Dr. Gwenlyn Found will cath on Tuesday with Dr. Ellyn Hack.  Concern for thrombus.  I have given samples for effient 10 mg daily, he will take asa 81 mg daily.  I refilled his klonapin   And added Livalo for cholesterol.. With elevated BP I added Micardis 40 mg daily.    Here in office with walking once around station, sp02 95% but significantly dyspneic-concern for angina. I also refilled his NTG.       Cardiac pacemaker, placed 2006 secondary to bradycardia,  Medtronic device Interrogated, rarely atrially paces, mostly at night.  Stable pacer parameters.  Dyslipidemia, with intolerance to Crestor and zocor and possible cough with Lipitor Gave pt samples of livalo for cholesterol, will see if he tolerates.  OSA on CPAP Uses every night no SOB at night.  HTN (hypertension) Elevated on propranolol but no ace or ARB, gave samples see above.    If pt becomes unstable over the weekend he will go to the hospital. Meds will need to be decided once cath results back.  He has enough samples for now

## 2013-09-21 NOTE — Assessment & Plan Note (Addendum)
Increasing DOE along with episodic chest pressure.  Has continued to increase over several months, now just walking in from waiting room has to stop and catch his breath.  Mild clamminess with DOE.  Has been off ASA and effient both since end of July.  He has DES Xience stent placed in April of this year to av groove.  Unfortunately he did not tolerate Brilinta with SOB and he was changed to Effient, he took free month supply and was not able to afford more. His asa was stopped for bruising.  Reviewed with Dr. Gwenlyn Found will cath on Tuesday with Dr. Ellyn Hack.  Concern for thrombus.  I have given samples for effient 10 mg daily, he will take asa 81 mg daily.  I refilled his klonapin   And added Livalo for cholesterol.. With elevated BP I added Micardis 40 mg daily.    Here in office with walking once around station, sp02 95% but significantly dyspneic-concern for angina. I also refilled his NTG.

## 2013-09-22 LAB — TSH: TSH: 1.118 u[IU]/mL (ref 0.350–4.500)

## 2013-09-24 ENCOUNTER — Telehealth: Payer: Self-pay | Admitting: Cardiology

## 2013-09-24 NOTE — Telephone Encounter (Signed)
Please call-scheduled to have Cath tomorrow,started with shingles Friday night,he is getting ready to go to the hospital.

## 2013-09-24 NOTE — Telephone Encounter (Signed)
Returned call and pt verified x 2.  Pt informed message received and cath will need to be rescheduled.  Pt verbalized understanding.  Stated after his appt on Friday he got home and his eye was itching.  Stated later that night his eye started swelling and he broke out over his forehead.  Pt stated he was on his way to [facility] ER for evaluation b/c he doesn't have a PCP.  Pt advised to be careful and identify that as exposure to elderly and children puts them at a greater risk.  Pt verbalized understanding and agreed w/ plan.  Zigmund Daniel or Deedie - This cath will need to be rescheduled.  Thanks. ~ [employer]

## 2013-09-24 NOTE — Telephone Encounter (Signed)
Cecilie Kicks, NP and Brita Romp, RN notified and advised cath be rescheduled prior to RN informing pt.

## 2013-09-24 NOTE — Telephone Encounter (Signed)
I was not aware that he was on my schedule for cath - I do not think I am the primary cardiologist though.  I would discuss with Mickel Baas & Dr. Gwenlyn Found who saw him last week. Leonie Man, MD

## 2013-09-24 NOTE — Telephone Encounter (Signed)
He was scheduled to have cath w/ Dr. Gwenlyn Found, but one of his OV notes stated he was supposed to f/u with you.  And Curt Bears was notified as well.

## 2013-09-25 ENCOUNTER — Encounter (HOSPITAL_COMMUNITY): Admission: RE | Payer: Self-pay | Source: Ambulatory Visit

## 2013-09-25 ENCOUNTER — Ambulatory Visit (HOSPITAL_COMMUNITY): Admission: RE | Admit: 2013-09-25 | Payer: Medicare PPO | Source: Ambulatory Visit | Admitting: Cardiovascular Disease

## 2013-09-25 ENCOUNTER — Encounter: Payer: Self-pay | Admitting: Cardiology

## 2013-09-25 ENCOUNTER — Encounter: Payer: Self-pay | Admitting: Cardiovascular Disease

## 2013-09-25 SURGERY — LEFT HEART CATHETERIZATION WITH CORONARY/GRAFT ANGIOGRAM
Anesthesia: LOCAL

## 2013-09-28 ENCOUNTER — Ambulatory Visit: Payer: Medicare PPO | Admitting: Cardiology

## 2013-10-01 ENCOUNTER — Ambulatory Visit (HOSPITAL_COMMUNITY): Admission: RE | Admit: 2013-10-01 | Payer: Medicare PPO | Source: Ambulatory Visit | Admitting: Cardiovascular Disease

## 2013-10-01 ENCOUNTER — Encounter (HOSPITAL_COMMUNITY): Admission: RE | Payer: Self-pay | Source: Ambulatory Visit

## 2013-10-01 SURGERY — LEFT HEART CATHETERIZATION WITH CORONARY ANGIOGRAM
Anesthesia: LOCAL

## 2013-10-09 ENCOUNTER — Encounter: Payer: Self-pay | Admitting: *Deleted

## 2013-10-17 LAB — MDC_IDC_ENUM_SESS_TYPE_INCLINIC
Brady Statistic RA Percent Paced: 15 %
Brady Statistic RV Percent Paced: 0 %
Implantable Pulse Generator Serial Number: 2706032752
Lead Channel Impedance Value: 550 Ohm
Lead Channel Impedance Value: 650 Ohm
Lead Channel Pacing Threshold Amplitude: 0.625 V
Lead Channel Pacing Threshold Amplitude: 0.75 V
Lead Channel Pacing Threshold Pulse Width: 0.4 ms
Lead Channel Pacing Threshold Pulse Width: 0.4 ms
Lead Channel Sensing Intrinsic Amplitude: 2.5 mV
Lead Channel Sensing Intrinsic Amplitude: 8 mV
Lead Channel Setting Pacing Amplitude: 2 V
Lead Channel Setting Pacing Amplitude: 2 V
Lead Channel Setting Pacing Pulse Width: 0.4 ms
Lead Channel Setting Sensing Sensitivity: 2 mV

## 2013-10-17 NOTE — Progress Notes (Signed)
Pacemaker check in clinic. Normal device function. Thresholds, sensing, impedances consistent with previous measurements. Device programmed to maximize longevity. No mode switch or high ventricular rates noted. Device programmed at appropriate safety margins. Histogram distribution appropriate for patient activity level. Device programmed to optimize intrinsic conduction. Estimated longevity >1-<2 years. Patient will follow up with [facility] in 3 months.

## 2013-10-24 ENCOUNTER — Encounter (HOSPITAL_COMMUNITY): Payer: Self-pay | Admitting: Pharmacy Technician

## 2013-10-29 ENCOUNTER — Encounter (HOSPITAL_COMMUNITY): Payer: Self-pay | Admitting: Physician Assistant

## 2013-10-29 ENCOUNTER — Encounter (HOSPITAL_COMMUNITY)
Admission: RE | Disposition: A | Discharge status: Home / Self Care | Payer: Self-pay | Source: Ambulatory Visit | Attending: Cardiovascular Disease

## 2013-10-29 ENCOUNTER — Ambulatory Visit (HOSPITAL_COMMUNITY)
Admission: RE | Admit: 2013-10-29 | Discharge: 2013-10-29 | Disposition: A | Discharge status: Home / Self Care | Payer: Medicare PPO | Source: Ambulatory Visit | Attending: Cardiovascular Disease | Admitting: Cardiovascular Disease

## 2013-10-29 ENCOUNTER — Other Ambulatory Visit: Payer: Self-pay

## 2013-10-29 DIAGNOSIS — Z01818 Encounter for other preprocedural examination: Secondary | ICD-10-CM

## 2013-10-29 DIAGNOSIS — G252 Other specified forms of tremor: Secondary | ICD-10-CM

## 2013-10-29 DIAGNOSIS — Z7982 Long term (current) use of aspirin: Secondary | ICD-10-CM | POA: Insufficient documentation

## 2013-10-29 DIAGNOSIS — R0989 Other specified symptoms and signs involving the circulatory and respiratory systems: Principal | ICD-10-CM | POA: Insufficient documentation

## 2013-10-29 DIAGNOSIS — Z7902 Long term (current) use of antithrombotics/antiplatelets: Secondary | ICD-10-CM | POA: Insufficient documentation

## 2013-10-29 DIAGNOSIS — Z9989 Dependence on other enabling machines and devices: Secondary | ICD-10-CM

## 2013-10-29 DIAGNOSIS — G250 Essential tremor: Secondary | ICD-10-CM | POA: Insufficient documentation

## 2013-10-29 DIAGNOSIS — I25810 Atherosclerosis of coronary artery bypass graft(s) without angina pectoris: Secondary | ICD-10-CM | POA: Insufficient documentation

## 2013-10-29 DIAGNOSIS — I10 Essential (primary) hypertension: Secondary | ICD-10-CM | POA: Insufficient documentation

## 2013-10-29 DIAGNOSIS — I2510 Atherosclerotic heart disease of native coronary artery without angina pectoris: Secondary | ICD-10-CM

## 2013-10-29 DIAGNOSIS — E785 Hyperlipidemia, unspecified: Secondary | ICD-10-CM | POA: Insufficient documentation

## 2013-10-29 DIAGNOSIS — R0609 Other forms of dyspnea: Secondary | ICD-10-CM | POA: Insufficient documentation

## 2013-10-29 DIAGNOSIS — G8929 Other chronic pain: Secondary | ICD-10-CM | POA: Insufficient documentation

## 2013-10-29 DIAGNOSIS — I214 Non-ST elevation (NSTEMI) myocardial infarction: Secondary | ICD-10-CM

## 2013-10-29 DIAGNOSIS — G4733 Obstructive sleep apnea (adult) (pediatric): Secondary | ICD-10-CM | POA: Diagnosis present

## 2013-10-29 DIAGNOSIS — M549 Dorsalgia, unspecified: Secondary | ICD-10-CM | POA: Insufficient documentation

## 2013-10-29 HISTORY — PX: LEFT HEART CATHETERIZATION WITH CORONARY/GRAFT ANGIOGRAM: SHX5450

## 2013-10-29 HISTORY — DX: Bradycardia, unspecified: R00.1

## 2013-10-29 LAB — BASIC METABOLIC PANEL
BUN: 12 mg/dL (ref 6–23)
CHLORIDE: 101 meq/L (ref 96–112)
CO2: 23 meq/L (ref 19–32)
Calcium: 9.4 mg/dL (ref 8.4–10.5)
Creatinine, Ser: 1.04 mg/dL (ref 0.50–1.35)
GFR calc Af Amer: >90 mL/min (ref >90)
GFR calc non Af Amer: 78 mL/min — ABNORMAL LOW (ref >90)
Glucose, Bld: 98 mg/dL (ref 70–99)
POTASSIUM: 4.4 meq/L (ref 3.7–5.3)
Sodium: 138 mEq/L (ref 137–147)

## 2013-10-29 LAB — PROTIME-INR
INR: 0.95 (ref 0.00–1.49)
Prothrombin Time: 12.5 seconds (ref 11.6–15.2)

## 2013-10-29 LAB — CBC
HEMATOCRIT: 47.2 % (ref 39.0–52.0)
HEMOGLOBIN: 16.7 g/dL (ref 13.0–17.0)
MCH: 29.3 pg (ref 26.0–34.0)
MCHC: 35.4 g/dL (ref 30.0–36.0)
MCV: 83 fL (ref 78.0–100.0)
Platelets: 267 10*3/uL (ref 150–400)
RBC: 5.69 MIL/uL (ref 4.22–5.81)
RDW: 14.2 % (ref 11.5–15.5)
WBC: 7.6 10*3/uL (ref 4.0–10.5)

## 2013-10-29 SURGERY — LEFT HEART CATHETERIZATION WITH CORONARY/GRAFT ANGIOGRAM
Anesthesia: LOCAL

## 2013-10-29 MED ORDER — ONDANSETRON HCL 4 MG/2ML IJ SOLN
INTRAMUSCULAR | Status: AC
  Filled 2013-10-29: qty 2

## 2013-10-29 MED ORDER — SODIUM CHLORIDE 0.9 % IV SOLN
INTRAVENOUS | Status: DC
  Administered 2013-10-29: 13:00:00 via INTRAVENOUS

## 2013-10-29 MED ORDER — HYDRALAZINE HCL 20 MG/ML IJ SOLN
INTRAMUSCULAR | Status: AC
  Filled 2013-10-29: qty 1

## 2013-10-29 MED ORDER — DIAZEPAM 5 MG PO TABS
5.0000 mg | ORAL_TABLET | ORAL | Status: AC
  Administered 2013-10-29: 5 mg via ORAL

## 2013-10-29 MED ORDER — MORPHINE SULFATE 2 MG/ML IJ SOLN: 2.0000 mg | INTRAMUSCULAR | Status: DC | PRN

## 2013-10-29 MED ORDER — ACETAMINOPHEN 325 MG PO TABS: 650.0000 mg | ORAL_TABLET | ORAL | Status: DC | PRN

## 2013-10-29 MED ORDER — ASPIRIN 81 MG PO CHEW
81.0000 mg | CHEWABLE_TABLET | ORAL | Status: AC
  Administered 2013-10-29: 81 mg via ORAL

## 2013-10-29 MED ORDER — ASPIRIN 81 MG PO CHEW
CHEWABLE_TABLET | ORAL | Status: AC
  Filled 2013-10-29: qty 1

## 2013-10-29 MED ORDER — SODIUM CHLORIDE 0.9 % IJ SOLN: 3.0000 mL | INTRAMUSCULAR | Status: DC | PRN

## 2013-10-29 MED ORDER — DIAZEPAM 5 MG PO TABS
ORAL_TABLET | ORAL | Status: AC
  Filled 2013-10-29: qty 1

## 2013-10-29 MED ORDER — SODIUM CHLORIDE 0.9 % IV SOLN: INTRAVENOUS | Status: AC

## 2013-10-29 MED ORDER — HEPARIN (PORCINE) IN NACL 2-0.9 UNIT/ML-% IJ SOLN
INTRAMUSCULAR | Status: AC
  Filled 2013-10-29: qty 1500

## 2013-10-29 MED ORDER — ONDANSETRON HCL 4 MG/2ML IJ SOLN: 4.0000 mg | Freq: Four times a day (QID) | INTRAMUSCULAR | Status: DC | PRN

## 2013-10-29 MED ORDER — MIDAZOLAM HCL 2 MG/2ML IJ SOLN
INTRAMUSCULAR | Status: AC
  Filled 2013-10-29: qty 2

## 2013-10-29 MED ORDER — FENTANYL CITRATE 0.05 MG/ML IJ SOLN
INTRAMUSCULAR | Status: AC
  Filled 2013-10-29: qty 2

## 2013-10-29 MED ORDER — LIDOCAINE HCL (PF) 1 % IJ SOLN
INTRAMUSCULAR | Status: AC
  Filled 2013-10-29: qty 30

## 2013-10-29 NOTE — CV Procedure (Signed)
ELLIOT Rangel is a 58 y.o. male    [id number] LOCATION:  FACILITY: [place]  PHYSICIAN: Quay Burow, M.D. May 26, 1956   DATE OF PROCEDURE:  10/29/2013  DATE OF DISCHARGE:     CARDIAC CATHETERIZATION     History obtained from chart review.The patient is a 58 year old overweight Caucasian male with a history of coronary artery disease and coronary bypass grafting in 2002. He had a LIMA to the LAD, a VG to the ramus intermediate, SVG to the OM and SVG to the PDA. He had stenting of both vein grafts to the PDA and the OM in 2011 with bare metal stent. He had a PDA with a VeriFLEX stent. He underwent cardiac catheterization on 11/29/12  for symptoms of unstable angina. EF at that time was 49% with mild inferolateral hypokinesis. It was noted he had an occluded vein graft to the OM with inadequate collateral flow. There was moderate in-stent restenosis in the vein graft to the RPDA. The LIMA was patent as was the ramus intermediate, there was a  significant lesion obstructing the retrograde flow to the other branch of the ramus.  He also has hypertension and obstructive sleep apnea on CPAP, a pacemaker implanted for bradycardia which is a Medtronic device that was placed in 2006. He also has an essential tremor(on propranolol), dyslipidemia with intolerance to  Crestor, Zocor but is tolerating Lipitor. He has chronic back pain with multiple surgeries. He was recently hospitalized from April 5th to April 10th at which time he had a non-ST-elevation myocardial infarction. He underwent successful complex PCI to a diffuse AV groove circumflex stenosis with extensive AngioSculpt. A Xience Xpedition stent DES was placed. His lisinopril was increased to 20 mg, Paxil was started, along with Brilinta. During his last office visit the patient indicated he develop shortness of breath right after starting Brilinta. However, this resolved after 2-3 days of being on the medicine. Later the patient called the  office complaining of severe dyspnea so he was changed o Effient. Because of ongoing severe dyspnea on exertion without obvious cause it was elected to proceed with outpatient diagnostic coronary arteriography to rule out an ischemic etiology    PROCEDURE DESCRIPTION:   The patient was brought to the second floor [facility] cath lab in the postabsorptive state. He was premedicated with Valium 5 mg by mouth, IV Versed and fentanyl. His right groin was prepped and shaved in usual sterile fashion. Xylocaine 1% was used for local anesthesia. A 5 French sheath was inserted into the I. Common femoral artery using standard Seldinger technique. 5 French right and left Judkins diagnostic catheters along with a 5 French pigtail catheter, right and left bypass graft catheters were used for selective coronary angiography, selective vein graft angiography, selective internal mammary artery angiography and left ventriculography. Visipaque dye was used for the entirety of the case. Retrograde aorta, left ventricular and pulmonary pressures were recorded.   HEMODYNAMICS:    AO SYSTOLIC/AO DIASTOLIC: [id number]   LV SYSTOLIC/LV DIASTOLIC: [id number]  ANGIOGRAPHIC RESULTS:   1. Left main; 60-70% ostial  2. LAD; occluded in its proximal portion. There is a 95% segmental ostial/proximal stenosis followed by a large septal perforator after which the LAD was occluded. 3. Left circumflex; the stented segment was widely patent. There was a large ramus branch that was bypassed. The branch bifurcated after the insertion of the vein graft and had an 80% stenosis in the inferior branch of the bifurcation unchanged from prior cath.  4. Right coronary  artery; dominant and occluded in the midportion 5.LIMA TO LAD; widely patent 6. SVG TO to the ramus branch was widely patent     SVG TO to the PDA had a 50% aorto ostial stenosis unchanged from the prior cath     SVG TO to the marginal branch was occluded at the aorta  unchanged from the prior cath 7. Left ventriculography; RAO left ventriculogram was performed using  25 mL of Visipaque dye at 12 mL/second. The overall LVEF estimated  60 % Without wall motion abnormalities  IMPRESSION:[name] has unchanged anatomy from his prior cath. His AV groove circumflex stented segment was widely patent. The remainder of his grafts were patent. His LV function was normal and his LVEDP was only 19. I have no explanation for his progressive dyspnea. His Brilenta was changed as it and this did not have an effect on his dyspnea. Continued medical therapy will be recommended. Reentry was performed of his right common femoral artery through the SideArm sheath and his groin was sealed with a MYNX closure device providing excellent hemostasis. The patient be hydrated for 2 hours and discharged home. He will see a mid-level provider 1-2 weeks and me back in 6-8 weeks.  Lorretta Harp MD, [facility] 10/29/2013 5:33 PM

## 2013-10-29 NOTE — Progress Notes (Signed)
Discharge instruction given per MD order.  Pt and CG able to verbalize understanding.  Pt to car via wheelchair. 

## 2013-10-29 NOTE — Discharge Instructions (Signed)
Angiography, Care After Refer to this sheet in the next few weeks. These instructions provide you with information on caring for yourself after your procedure. Your health care provider may also give you more specific instructions. Your treatment has been planned according to current medical practices, but problems sometimes occur. Call your health care provider if you have any problems or questions after your procedure.  WHAT TO EXPECT AFTER THE PROCEDURE After your procedure, it is typical to have the following sensations:  Minor discomfort or tenderness and a small bump at the catheter insertion site. The bump should usually decrease in size and tenderness within 1 to 2 weeks.  Any bruising will usually fade within 2 to 4 weeks. HOME CARE INSTRUCTIONS   You may need to keep taking blood thinners if they were prescribed for you. Only take over-the-counter or prescription medicines for pain, fever, or discomfort as directed by your health care provider.  Do not apply powder or lotion to the site.  Do not sit in a bathtub, swimming pool, or whirlpool for 5 to 7 days.  You may shower 24 hours after the procedure. Remove the bandage (dressing) and gently wash the site with plain soap and water. Gently pat the site dry.  Inspect the site at least twice daily.  Limit your activity for the first 48 hours. Do not bend, squat, or lift anything over 20 lb (9 kg) or as directed by your health care provider.  Do not drive home if you are discharged the day of the procedure. Have someone else drive you. Follow instructions about when you can drive or return to work. SEEK MEDICAL CARE IF:  You get lightheaded when standing up.  You have drainage (other than a small amount of blood on the dressing).  You have chills.  You have a fever.  You have redness, warmth, swelling, or pain at the insertion site. SEEK IMMEDIATE MEDICAL CARE IF:   You develop chest pain or shortness of breath, feel faint,  or pass out.  You have bleeding, swelling larger than a walnut, or drainage from the catheter insertion site.  You develop pain, discoloration, coldness, or severe bruising in the leg or arm that held the catheter.  You develop bleeding from any other place, such as the bowels. You may see bright red blood in your urine or stools, or your stools may appear black and tarry.  You have heavy bleeding from the site. If this happens, hold pressure on the site.  Call 911 if bleeding is not under control. MAKE SURE YOU:  Understand these instructions.  Will watch your condition.  Will get help right away if you are not doing well or get worse. Document Released: 04/15/2005 Document Revised: 05/30/2013 Document Reviewed: 02/19/2013 [facility] Patient Information 2014 [place].

## 2013-10-29 NOTE — H&P (Signed)
History and Physical   Patient ID: [name] MRN: [id number], DOB/AGE: Jun 14, 1956 58 y.o. Date of Encounter: 10/29/2013  Primary Physician: None Primary Cardiologist: Ellyn Hack  Chief Complaint:  SOB  HPI: [name] is a 58 y.o. male with a history of CAD. He has been having problems with SOB for months. Seen in December and symptoms concerning for angina. Cardiac cath scheduled but pt developed shingles that was facial and threatened the sight in his right eye. He was followed closely, and with aggressive medical treatment, has recovered.   Through all this, his DOE did not improve. He gets SOB at times at rest and has consistent DOE with walking < 50 feet. Cardiac catheterization was scheduled and he is here today for the procedure. His BP has been running high (SBP > 170) and his heart rate is generally > 80.   He has significant problems affording his medications and is currently taking only ASA, propanolol and Vicodin (chronic back issues). He is frustrated at not being able to afford more medication but the only income he has is disability.  He lives in [place], but comes to [place] for cardiac care. He is trying to find a primary MD in [place].   Past Medical History  Diagnosis Date  . Anginal pain   . CAD (coronary artery disease), with CABG LIMA-LAD, VG-ramus intermediate, SVG-OM, SVG-PDA 2002  . CAD (coronary artery disease) of artery bypass graft, with stent to VG-OM-(BMS)    . OSA on CPAP    . Bradycardia by electrocardiogram 2006    Medtronic device  . Tremor, essential, treated with propranolol    . HTN (hypertension)    . Dyslipidemia, with intolerance to Crestor and zocor and possible cough with Lipitor    . Back pain, chronic      Surgical History:  Past Surgical History  Procedure Laterality Date  . Coronary artery bypass graft    . Cardiac catheterization  01/2013  . Coronary angioplasty  01/17/13  . Insert / replace / remove  pacemaker  2006    Medtronic device  . Back surgery       I have reviewed the patient's current medications. Prior to Admission medications - He currently is taking propanolol, ASA and Vicodin.   Medication Sig Start Date End Date Taking? Authorizing Provider  aspirin EC 81 MG tablet Take 81 mg by mouth daily.   Yes Historical Provider, MD  clonazePAM (KLONOPIN) 2 MG tablet Take 1 tablet (2 mg total) by mouth at bedtime. 09/21/13  Yes Cecilie Kicks, NP  HYDROcodone-acetaminophen (NORCO/VICODIN) 5-325 MG per tablet Take 1 tablet by mouth every 6 (six) hours as needed for moderate pain.   Yes Historical Provider, MD  nitroGLYCERIN (NITROSTAT) 0.4 MG SL tablet Place 0.4 mg under the tongue every 5 (five) minutes as needed for chest pain.   Yes Historical Provider, MD  prasugrel (EFFIENT) 10 MG TABS tablet Take 1 tablet (10 mg total) by mouth daily. 09/21/13  Yes Cecilie Kicks, NP  propranolol (INDERAL) 40 MG tablet Take 1 tablet (40 mg total) by mouth 3 (three) times daily. 01/18/13  Yes Tarri Fuller, PA-C  telmisartan (MICARDIS) 40 MG tablet Take 1 tablet (40 mg total) by mouth daily. 09/21/13  Yes Cecilie Kicks, NP   Scheduled Meds: . aspirin      . diazepam       Continuous Infusions: . [START ON 10/30/2013] sodium chloride 75 mL/hr at 10/29/13 1325  PRN Meds:.sodium chloride  Allergies:  Allergies  Allergen Reactions  . Crestor [Rosuvastatin] Other (See Comments)    REACTION: Muscle aches  . Zocor [Simvastatin] Other (See Comments)    REACTION: Muscle aches    History   Social History  . Marital Status: Married    Spouse Name: N/A    Number of Children: N/A  . Years of Education: N/A   Occupational History  . Disabled    Social History Main Topics  . Smoking status: Never Smoker   . Smokeless tobacco: Never Used  . Alcohol Use: No  . Drug Use: No  . Sexual Activity: Yes   Other Topics Concern  . Not on file   Social History Narrative   Lives with wife.    Family  History  Problem Relation Age of Onset  . Heart disease Father   . Hyperlipidemia Father   . Hypertension Father    Family Status  Relation Status Death Age  . Father Alive     Review of Systems: Chronic back problems.  Full 14-point review of systems otherwise negative except as noted above.  Physical Exam: Blood pressure 152/82, pulse 60, temperature 98.1 F (36.7 C), temperature source Oral, resp. rate 18, height 5\' 10"  (1.778 m), weight 260 lb (117.935 kg), SpO2 99.00%. General: Well developed, well nourished,male in no acute distress. Head: Normocephalic, atraumatic, sclera non-icteric, no xanthomas, nares are without discharge. Dentition: poor Neck: No carotid bruits. JVD not elevated. No thyromegally Lungs: Good expansion bilaterally. without wheezes or rhonchi.  Heart: Regular rate and rhythm with S1 S2.  No S3 or S4.  No murmur, no rubs, or gallops appreciated. Abdomen: Soft, non-tender, non-distended with normoactive bowel sounds. No hepatomegaly. No rebound/guarding. No obvious abdominal masses. Msk:  Strength and tone appear normal for age. No joint deformities or effusions, no spine or costo-vertebral angle tenderness. Extremities: No clubbing or cyanosis. No edema.  Distal pedal pulses are 2+ in 4 extrem Neuro: Alert and oriented X 3. Moves all extremities spontaneously. No focal deficits noted. Psych:  Responds to questions appropriately with a normal affect. Skin: No rashes or lesions noted  Labs:   Lab Results  Component Value Date   WBC 7.6 10/29/2013   HGB 16.7 10/29/2013   HCT 47.2 10/29/2013   MCV 83.0 10/29/2013   PLT 267 10/29/2013    Recent Labs  10/29/13 1316  INR 0.95     Recent Labs Lab 10/29/13 1316  NA 138  K 4.4  CL 101  CO2 23  BUN 12  CREATININE 1.04  CALCIUM 9.4  GLUCOSE 98    Radiology/Studies:  CXR 09/21/2013 NAD   ECG:  29-Oct-2013 13:01:52  Atrial-paced rhythm with prolonged AV conduction Left axis deviation Minimal  voltage criteria for LVH, may be normal variant Vent. rate 60 BPM PR interval 228 ms QRS duration 84 ms QT/QTc 412/412 ms P-R-T axes 18 -37 88  ASSESSMENT AND PLAN:  Principal Problem:   DOE (dyspnea on exertion), anginal equivalent - cath today, f/u on results. Will need to use $4 Rx whenever possible.   Active Problems:   HTN (hypertension) - needs better BP control.   Dyslipidemia, with intolerance to Crestor and zocor and possible cough with Lipitor - lovastatin is only $4 statin. He will need this, since samples cannot last all year.    OSA on CPAP - continue CPAP QHS  Jonetta Speak, PA-C 10/29/2013 4:10 PM Beeper [id number]    Agree with Rosaria Ferries PA-C  Pt with known CAD and progressive DOE admitted now for cath to R/O anginal equivalent  Lorretta Harp, M.D., FACP, [facility], [address] [place] [place], [place]  [address]  [phone] 10/29/2013 4:47 PM

## 2013-10-29 NOTE — Interval H&P Note (Signed)
Cath Lab Visit (complete for each Cath Lab visit)  Clinical Evaluation Leading to the Procedure:   ACS: no  Non-ACS:    Anginal Classification: CCS III  Anti-ischemic medical therapy: Maximal Therapy (2 or more classes of medications)  Non-Invasive Test Results: No non-invasive testing performed  Prior CABG: Previous CABG      History and Physical Interval Note:  10/29/2013 4:49 PM  [name]  has presented today for surgery, with the diagnosis of Chest pain  The various methods of treatment have been discussed with the patient and family. After consideration of risks, benefits and other options for treatment, the patient has consented to  Procedure(s): LEFT HEART CATHETERIZATION WITH CORONARY/GRAFT ANGIOGRAM (N/A) as a surgical intervention .  The patient's history has been reviewed, patient examined, no change in status, stable for surgery.  I have reviewed the patient's chart and labs.  Questions were answered to the patient's satisfaction.     Lorretta Harp

## 2013-10-29 NOTE — Progress Notes (Signed)
Received pt from procedure alert and able tolerate fluids, denies any discomfort at this time.

## 2013-11-02 ENCOUNTER — Telehealth: Payer: Self-pay | Admitting: Cardiology

## 2013-11-02 DIAGNOSIS — I25810 Atherosclerosis of coronary artery bypass graft(s) without angina pectoris: Secondary | ICD-10-CM

## 2013-11-02 MED ORDER — CLOPIDOGREL BISULFATE 75 MG PO TABS
75.0000 mg | ORAL_TABLET | Freq: Every day | ORAL | Status: DC
Start: 2013-11-02 — End: 2015-01-22

## 2013-11-02 MED ORDER — IRBESARTAN 150 MG PO TABS: 150.0000 mg | ORAL_TABLET | Freq: Every day | ORAL | Status: DC

## 2013-11-02 NOTE — Telephone Encounter (Signed)
Per [name], PharmD, pt can switch from Effient to Plavix 75 mg daily and have P2Y12 lab test at [facility] in 2 weeks to make sure it is working properly.  She will check on alternative for Micardis.   Pt does not need to see Dr. Gwenlyn Found as Dr. Ellyn Hack is is primary cardiologist.  Will reschedule appt w/ Dr. Ellyn Hack.

## 2013-11-02 NOTE — Telephone Encounter (Signed)
Spoke with pt, he was given Micardis for BP control, will switch to irbesartan 150mg  qd.  Explained change in med to pt, answered questions.  Rx sent to [facility].

## 2013-11-02 NOTE — Telephone Encounter (Signed)
Will not refill klonopin. Would defer to new PCP.  Agree with change of Effient to Plavix.  -Dr. Debara Pickett

## 2013-11-02 NOTE — Telephone Encounter (Signed)
Returned call and pt verified x 2.  Pt stated he had a heart cath on Monday and the PA came in before he went down and told him she would send in scripts for cheaper medications.  RN reviewed chart and Rosaria Ferries, PA-C did see pt prior to procedure and discussed pt needed cheaper medications on the $4 list at [facility].  Pt stated he did not receive any prescriptions and [place] away.  Pt also requesting a refill on Klonopin.  Pt informed he should contact his PCP for this as this is not a cardiac med.  Pt stated Mickel Baas gave it to him b/c he doesn't have a PCP.  Pt informed he will need to establish w/ a PCP soon and may need to go to Urgent Care for evaluation to get this med.  Pt verbalized understanding.  Pt informed RN will have a provider review chart for further instructions and call him back.  Pt verbalized understanding and agreed w/ plan.  Message forwarded to Dr. Debara Pickett (DOD)/Kristin Brion Aliment, PharmD  1) Needs affordable alternatives to Effient and Micardis 2) Refill on Klonipin (last prescribed by Cecilie Kicks, NP) 3) Does pt need to see Dr. Gwenlyn Found as he is a pt of Dr. Ellyn Hack?

## 2013-11-02 NOTE — Telephone Encounter (Signed)
Sounds good

## 2013-11-02 NOTE — Telephone Encounter (Signed)
Pt called back.  Informed he will need to get Klonopin cannot be refilled.  Also informed Effient changed to Plavix and appt should be w/ Dr. Ellyn Hack instead of Dr. Gwenlyn Found.  Appt rescheduled for 3.18.15 w/ Dr. Ellyn Hack and appt on 2.4.15 w/ Cecilie Kicks, NP rescheduled for later time at 10:20am.  Pt will have P2Y12 done at [facility] that day as he lives 1.5 hrs away.  Pt informed pharmacist is checking on an alternative to Micardis and both Rxs will be sent in once complete.  Pt verbalized understanding and agreed w/ plan.  Message forwarded to K. Alvstad, PharmD.  Alternative to Micardis

## 2013-11-02 NOTE — Telephone Encounter (Signed)
Had a cath on Monday. The PA at the hospital told him she was going to change his medicine. She did not give him any prescriptions,please call.

## 2013-11-02 NOTE — Telephone Encounter (Signed)
Sounds fine.  Ricardo Merritts W, MD  

## 2013-11-02 NOTE — Telephone Encounter (Signed)
Lab slip was mailed to pt.

## 2013-11-09 ENCOUNTER — Ambulatory Visit (INDEPENDENT_AMBULATORY_CARE_PROVIDER_SITE_OTHER): Payer: Medicare PPO | Admitting: Cardiology

## 2013-11-09 ENCOUNTER — Encounter: Payer: Self-pay | Admitting: Cardiology

## 2013-11-09 VITALS — BP 142/82 | HR 68 | Ht 70.0 in | Wt 268.0 lb

## 2013-11-09 DIAGNOSIS — I25810 Atherosclerosis of coronary artery bypass graft(s) without angina pectoris: Secondary | ICD-10-CM

## 2013-11-09 DIAGNOSIS — B029 Zoster without complications: Secondary | ICD-10-CM

## 2013-11-09 DIAGNOSIS — E785 Hyperlipidemia, unspecified: Secondary | ICD-10-CM

## 2013-11-09 DIAGNOSIS — G4733 Obstructive sleep apnea (adult) (pediatric): Secondary | ICD-10-CM

## 2013-11-09 DIAGNOSIS — I214 Non-ST elevation (NSTEMI) myocardial infarction: Secondary | ICD-10-CM

## 2013-11-09 DIAGNOSIS — I2510 Atherosclerotic heart disease of native coronary artery without angina pectoris: Secondary | ICD-10-CM

## 2013-11-09 DIAGNOSIS — Z9989 Dependence on other enabling machines and devices: Secondary | ICD-10-CM

## 2013-11-09 NOTE — Patient Instructions (Addendum)
Heart Healthy diet.  Follow up with Dr. Ellyn Hack in 6-8 weeks.  Call if any further problems.  Have cholesterol checked before appt with Dr. Ellyn Hack

## 2013-11-11 DIAGNOSIS — B029 Zoster without complications: Secondary | ICD-10-CM | POA: Insufficient documentation

## 2013-11-11 NOTE — Assessment & Plan Note (Signed)
After office visit pt was diagnosed with shingles, of the rt eye and head.  Treated cath postponed until improved.

## 2013-11-11 NOTE — Assessment & Plan Note (Signed)
Pt had stopped Brilinta due to cost, developed DOE, chest pressure, follow up cath with patent stent.  No further symptoms.

## 2013-11-11 NOTE — Assessment & Plan Note (Signed)
continues

## 2013-11-11 NOTE — Progress Notes (Signed)
11/11/2013   PCP: No PCP Per Patient   Chief Complaint  Patient presents with  . post hosp    post cath. no problems since leaving the hospital.  no chest pain. No stents were placed.    Primary Cardiologist:  Dr. Ellyn Hack  HPI:  58 year old overweight Caucasian male with a history of coronary artery disease and coronary bypass grafting in 2002. He had a LIMA to the LAD, a VG to the ramus intermediate, SVG to the OM and SVG to the PDA. He had stenting of both vein grafts to the PDA and the OM in 2011 with bare metal stent. He had a PDA with a VeriFLEX stent. He underwent cardiac catheterization on 11/29/12  for symptoms of unstable angina. EF at that time was 49% with mild inferolateral hypokinesis. It was noted he had an occluded vein graft to the OM with inadequate collateral flow. There was moderate in-stent restenosis in the vein graft to the RPDA. The LIMA was patent as was the ramus intermediate, there was a  significant lesion obstructing the retrograde flow to the other branch of the ramus.  He also has hypertension and obstructive sleep apnea on CPAP, a pacemaker implanted for bradycardia which is a Medtronic device that was placed in 2006. He also has an essential tremor(on propranolol), dyslipidemia with intolerance to  Crestor, Zocor but is tolerating Lipitor. He has chronic back pain with multiple surgeries. He was recently hospitalized from April 5th to April 10th at which time he had a non-ST-elevation myocardial infarction. He underwent successful complex PCI to a diffuse AV groove circumflex stenosis with extensive AngioSculpt. A Xience Xpedition stent DES was placed. His lisinopril was increased to 20 mg, Paxil was started, along with Brilinta. During his last office visit the patient indicated he develop shortness of breath right after starting Brilinta. However, this resolved after 2-3 days of being on the medicine. Later the patient called the office complaining  of severe dyspnea so he was changed o Effient.   He was seen 09/21/13 for increasing DOE and chest pressure.  At that time he had stopped ASA, Effient, lisinopril, lipitor, all his meds.  WE planned cardiac cath to rule out thrombus, and he was given Effient samples that day.  Within a few days he was diagnosed with Shingles of his Rt eye, cath was rescheduled once shingles resolved.    Cath was done 10/29/13 revealing unchanged anatomy and normal LV function.  Interestingly pt stated today that before cath he had 2 dreams, one with his life passing quickly before his eyes and second his dead father telling him he would be with him soon.  After the second dream he had no more symptoms.   With NSTEMI in April he had similar dream and symptoms resolved.  He is here today for follow up.  No pain, no SOB.  He is now on Plavix and can afford this drug.       Allergies  Allergen Reactions  . Crestor [Rosuvastatin] Other (See Comments)    REACTION: Muscle aches  . Zocor [Simvastatin] Other (See Comments)    REACTION: Muscle aches    Current Outpatient Prescriptions  Medication Sig Dispense Refill  . aspirin EC 81 MG tablet Take 81 mg by mouth daily.      . clonazePAM (KLONOPIN) 2 MG tablet Take 1 tablet (2 mg total) by mouth at bedtime.  30 tablet  0  . clopidogrel (PLAVIX) 75  MG tablet Take 1 tablet (75 mg total) by mouth daily.  30 tablet  2  . HYDROcodone-acetaminophen (NORCO/VICODIN) 5-325 MG per tablet Take 1 tablet by mouth every 6 (six) hours as needed for moderate pain.      Marland Kitchen irbesartan (AVAPRO) 150 MG tablet Take 1 tablet (150 mg total) by mouth daily.  30 tablet  5  . nitroGLYCERIN (NITROSTAT) 0.4 MG SL tablet Place 0.4 mg under the tongue every 5 (five) minutes as needed for chest pain.      Marland Kitchen propranolol (INDERAL) 40 MG tablet Take 1 tablet (40 mg total) by mouth 3 (three) times daily.  90 tablet  5   No current facility-administered medications for this visit.    Past Medical  History  Diagnosis Date  . Anginal pain   . CAD (coronary artery disease), with CABG LIMA-LAD, VG-ramus intermediate, SVG-OM, SVG-PDA 2002  . CAD (coronary artery disease) of artery bypass graft, with stent to VG-OM-(BMS)    . OSA on CPAP    . Bradycardia by electrocardiogram 2006    Medtronic device  . Tremor, essential, treated with propranolol    . HTN (hypertension)    . Dyslipidemia, with intolerance to Crestor and zocor and possible cough with Lipitor    . Back pain, chronic      Past Surgical History  Procedure Laterality Date  . Coronary artery bypass graft    . Cardiac catheterization  01/2013  . Coronary angioplasty  01/17/13  . Insert / replace / remove pacemaker  2006    Medtronic device  . Back surgery      [id number] colds or fevers, no weight changes Skin:no rashes or ulcers HEENT:no blurred vision, no congestion CV:see HPI PUL:see HPI GI:no diarrhea constipation or melena, no indigestion GU:no hematuria, no dysuria MS:no joint pain, no claudication Neuro:no syncope, no lightheadedness Endo:no diabetes, no thyroid disease  PHYSICAL EXAM BP 142/82  Pulse 68  Ht 5\' 10"  (1.778 m)  Wt 268 lb (121.564 kg)  BMI 38.45 kg/m2 General:Pleasant affect, NAD Skin:Warm and dry, brisk capillary refill HEENT:normocephalic, sclera clear, mucus membranes moist Neck:supple, no JVD, no bruits  Heart:S1S2 RRR without murmur, gallup, rub or click Lungs:clear without rales, rhonchi, or wheezes [id number], non tender, + BS, do not palpate liver spleen or masses Ext:no lower ext edema, 2+ pedal pulses, 2+ radial pulses Neuro:alert and oriented, MAE, follows commands, + facial symmetry   ASSESSMENT AND PLAN CAD (coronary artery disease) of artery bypass graft, with stent to VG-OM-(BMS) & VG-PDA,in 2001, cath 11/2012 tot occ VG-marginal vessels,though + collaterals,other grafts patent/mild dz med.treatmen  No further SOB or chest discomfort, now on plavix  NSTEMI (non-ST  elevated myocardial infarction), 01/17/13 PCI LCX AV Groove with Xience DES. and angiosculpt. unable to cross occluded OM.  EF 40 % Pt had stopped Brilinta due to cost, developed DOE, chest pressure, follow up cath with patent stent.  No further symptoms.  OSA on CPAP continues  Shingles After office visit pt was diagnosed with shingles, of the rt eye and head.  Treated cath postponed until improved.    Pt will hve lipids done before next visit with Dr. Ellyn Hack in 4-6 weeks.  He will also need P2Y12 done at [facility] hospital before that visit.

## 2013-11-11 NOTE — Assessment & Plan Note (Signed)
No further SOB or chest discomfort, now on plavix

## 2013-11-14 ENCOUNTER — Ambulatory Visit: Payer: Medicare PPO | Admitting: Cardiology

## 2013-12-25 ENCOUNTER — Ambulatory Visit: Payer: Medicare PPO | Admitting: Cardiovascular Disease

## 2013-12-26 ENCOUNTER — Ambulatory Visit: Payer: Medicare PPO | Admitting: Cardiology

## 2014-01-01 ENCOUNTER — Encounter: Payer: Self-pay | Admitting: *Deleted

## 2014-01-03 ENCOUNTER — Ambulatory Visit: Payer: Medicare PPO | Admitting: Cardiology

## 2014-01-03 ENCOUNTER — Ambulatory Visit: Payer: Self-pay | Admitting: Cardiology

## 2014-02-01 ENCOUNTER — Other Ambulatory Visit (HOSPITAL_COMMUNITY): Payer: Self-pay | Admitting: Physician Assistant

## 2014-02-04 NOTE — Telephone Encounter (Signed)
Rx refill sent to patients pharmacy  

## 2014-05-08 ENCOUNTER — Other Ambulatory Visit: Payer: Self-pay | Admitting: Neurosurgery

## 2014-05-08 DIAGNOSIS — M47896 Other spondylosis, lumbar region: Secondary | ICD-10-CM

## 2014-05-22 ENCOUNTER — Other Ambulatory Visit: Payer: Self-pay | Admitting: Neurosurgery

## 2014-05-22 ENCOUNTER — Ambulatory Visit
Admission: RE | Admit: 2014-05-22 | Discharge: 2014-05-22 | Disposition: A | Discharge status: Home / Self Care | Payer: Medicare PPO | Source: Ambulatory Visit | Attending: Neurosurgery | Admitting: Neurosurgery

## 2014-05-22 VITALS — BP 174/87 | HR 74

## 2014-05-22 DIAGNOSIS — M47896 Other spondylosis, lumbar region: Secondary | ICD-10-CM

## 2014-05-22 MED ORDER — IOHEXOL 180 MG/ML  SOLN
1.0000 mL | Freq: Once | INTRAMUSCULAR | Status: AC | PRN
  Administered 2014-05-22: 1 mL via EPIDURAL

## 2014-05-22 MED ORDER — METHYLPREDNISOLONE ACETATE 40 MG/ML INJ SUSP (RADIOLOG
120.0000 mg | Freq: Once | INTRAMUSCULAR | Status: AC
  Administered 2014-05-22: 120 mg via EPIDURAL

## 2014-05-22 NOTE — Discharge Instructions (Signed)

## 2014-05-31 ENCOUNTER — Other Ambulatory Visit: Payer: Self-pay | Admitting: Neurosurgery

## 2014-05-31 DIAGNOSIS — M47816 Spondylosis without myelopathy or radiculopathy, lumbar region: Secondary | ICD-10-CM

## 2014-06-06 ENCOUNTER — Ambulatory Visit
Admission: RE | Admit: 2014-06-06 | Discharge: 2014-06-06 | Disposition: A | Discharge status: Home / Self Care | Payer: Medicare PPO | Source: Ambulatory Visit | Attending: Neurosurgery | Admitting: Neurosurgery

## 2014-06-06 DIAGNOSIS — M47816 Spondylosis without myelopathy or radiculopathy, lumbar region: Secondary | ICD-10-CM

## 2014-06-21 ENCOUNTER — Ambulatory Visit: Payer: Medicare PPO | Admitting: Physician Assistant

## 2014-07-18 ENCOUNTER — Ambulatory Visit (INDEPENDENT_AMBULATORY_CARE_PROVIDER_SITE_OTHER): Payer: Self-pay | Admitting: Radiology

## 2014-07-18 ENCOUNTER — Ambulatory Visit (INDEPENDENT_AMBULATORY_CARE_PROVIDER_SITE_OTHER): Payer: Medicare PPO | Admitting: Neurology

## 2014-07-18 DIAGNOSIS — G5602 Carpal tunnel syndrome, left upper limb: Secondary | ICD-10-CM

## 2014-07-18 DIAGNOSIS — M5416 Radiculopathy, lumbar region: Secondary | ICD-10-CM | POA: Diagnosis not present

## 2014-07-18 DIAGNOSIS — G5603 Carpal tunnel syndrome, bilateral upper limbs: Secondary | ICD-10-CM

## 2014-07-18 DIAGNOSIS — R202 Paresthesia of skin: Secondary | ICD-10-CM

## 2014-07-18 DIAGNOSIS — Z0289 Encounter for other administrative examinations: Secondary | ICD-10-CM

## 2014-07-18 DIAGNOSIS — G5601 Carpal tunnel syndrome, right upper limb: Secondary | ICD-10-CM

## 2014-07-18 NOTE — Procedures (Signed)
HISTORY:  [name] is a 58 year old gentleman with a history of multiple cervical and lumbosacral spine procedures in the past. The patient recently developed discomfort in the right hip, going to the right thigh down to the right foot. He responded to prednisone therapy with the leg pain, but he then developed severe right hand and arm discomfort. He has minimal neck discomfort. The patient is sent over for an evaluation of the arm and leg discomfort.  NERVE CONDUCTION STUDIES:  Nerve conduction studies were performed on both upper extremities. The distal motor latencies for the median nerves were prolonged bilaterally, with normal motor amplitudes for these nerves bilaterally. The distal motor latencies and motor amplitudes for the ulnar nerves were normal bilaterally. The F wave latencies and nerve conduction velocities for the median and ulnar nerves were normal bilaterally. The sensory latencies for the median nerves were prolonged bilaterally, normal for the ulnar nerves and for the radial nerves bilaterally.  Nerve conduction studies were performed on both lower extremities. The distal motor latencies for the peroneal and posterior tibial nerves were normal bilaterally, with normal motor amplitudes for the posterior tibial nerves bilaterally and for the right peroneal nerve. The motor amplitudes for the left peroneal nerve were low. The nerve conduction velocities for the peroneal and posterior tibial nerves were normal bilaterally, with normal F wave latencies for the right peroneal nerve and for the left posterior tibial nerve. There is minimal prolongation for the right posterior tibial nerve F wave latency, and some prolongation for the F wave latency of the left peroneal nerve. The sensory latencies for the peroneal nerves were normal bilaterally.  EMG STUDIES:  EMG study was performed on the right upper extremity:  The first dorsal interosseous muscle reveals 2 to 4 K units  with full recruitment. No fibrillations or positive waves were noted. The abductor pollicis brevis muscle reveals 2 to 4 K units with full recruitment. No fibrillations or positive waves were noted. The extensor indicis proprius muscle reveals 1 to 3 K units with full recruitment. No fibrillations or positive waves were noted. The pronator teres muscle reveals 2 to 3 K units with full recruitment. No fibrillations or positive waves were noted. The biceps muscle reveals 1 to 2 K units with full recruitment. No fibrillations or positive waves were noted. The triceps muscle reveals 2 to 4 K units with full recruitment. No fibrillations or positive waves were noted. The anterior deltoid muscle reveals 2 to 3 K units with full recruitment. No fibrillations or positive waves were noted. The cervical paraspinal muscles were tested at 2 levels. No abnormalities of insertional activity were seen at either level tested. There was good relaxation.  EMG study was performed on the right lower extremity:  The tibialis anterior muscle reveals 2 to 5K motor units with decreased recruitment. No fibrillations or positive waves were seen. The peroneus tertius muscle reveals 2 to 4K motor units with slightly decreased recruitment. No fibrillations or positive waves were seen. The medial gastrocnemius muscle reveals 1 to 3K motor units with full recruitment. No fibrillations or positive waves were seen. The vastus lateralis muscle reveals 2 to 4K motor units with full recruitment. No fibrillations or positive waves were seen. The iliopsoas muscle reveals 2 to 4K motor units with full recruitment. No fibrillations or positive waves were seen. The biceps femoris muscle (long head) reveals 2 to 4K motor units with full recruitment. No fibrillations or positive waves were seen. The lumbosacral paraspinal muscles  were tested at 3 levels, and revealed 1+ positive waves at all 3 levels tested. There was good  relaxation.   IMPRESSION:  Nerve conduction studies done on both upper extremities shows evidence of mild bilateral carpal tunnel syndrome. Nerve conduction studies of the lower extremities were relatively unremarkable, without evidence of an overlying peripheral neuropathy. There is some lowering of motor amplitudes for the left peroneal nerve with sensory sparing, this finding could be related to an overlying L5 radiculopathy. Clinical correlation is required. EMG evaluation of the right upper extremity was normal. No evidence of an overlying cervical radiculopathy is seen. EMG evaluation of the right lower extremity shows findings most consistent with a chronic stable L5 radiculopathy.  Jill Alexanders MD 07/18/2014 4:42 PM  [facility] [address] [place], [place] [address]  Phone [phone] Fax [phone]

## 2014-07-29 ENCOUNTER — Ambulatory Visit: Payer: Medicare PPO | Admitting: Cardiology

## 2014-09-19 ENCOUNTER — Encounter (HOSPITAL_COMMUNITY): Payer: Self-pay | Admitting: Cardiology

## 2014-12-16 ENCOUNTER — Other Ambulatory Visit: Payer: Self-pay | Admitting: Neurosurgery

## 2014-12-16 DIAGNOSIS — I639 Cerebral infarction, unspecified: Secondary | ICD-10-CM

## 2014-12-17 ENCOUNTER — Encounter: Payer: Self-pay | Admitting: *Deleted

## 2014-12-18 ENCOUNTER — Ambulatory Visit
Admission: RE | Admit: 2014-12-18 | Discharge: 2014-12-18 | Disposition: A | Discharge status: Home / Self Care | Payer: Medicare PPO | Source: Ambulatory Visit | Attending: Neurosurgery | Admitting: Neurosurgery

## 2014-12-18 DIAGNOSIS — I639 Cerebral infarction, unspecified: Secondary | ICD-10-CM

## 2014-12-19 ENCOUNTER — Telehealth: Payer: Self-pay | Admitting: Cardiology

## 2014-12-19 NOTE — Telephone Encounter (Signed)
Closed encounter °

## 2015-01-22 ENCOUNTER — Encounter: Payer: Self-pay | Admitting: Cardiology

## 2015-01-22 ENCOUNTER — Telehealth: Payer: Self-pay | Admitting: *Deleted

## 2015-01-22 ENCOUNTER — Encounter: Payer: Self-pay | Admitting: Cardiovascular Disease

## 2015-01-22 ENCOUNTER — Other Ambulatory Visit: Payer: Self-pay | Admitting: Neurosurgery

## 2015-01-22 ENCOUNTER — Ambulatory Visit
Admission: RE | Admit: 2015-01-22 | Discharge: 2015-01-22 | Disposition: A | Discharge status: Home / Self Care | Payer: Medicare PPO | Source: Ambulatory Visit | Attending: Neurosurgery | Admitting: Neurosurgery

## 2015-01-22 ENCOUNTER — Ambulatory Visit (INDEPENDENT_AMBULATORY_CARE_PROVIDER_SITE_OTHER): Payer: Medicare PPO | Admitting: Cardiology

## 2015-01-22 VITALS — BP 150/91 | HR 70 | Ht 70.0 in | Wt 266.9 lb

## 2015-01-22 DIAGNOSIS — Z01812 Encounter for preprocedural laboratory examination: Secondary | ICD-10-CM

## 2015-01-22 DIAGNOSIS — M542 Cervicalgia: Secondary | ICD-10-CM

## 2015-01-22 DIAGNOSIS — I208 Other forms of angina pectoris: Secondary | ICD-10-CM | POA: Diagnosis not present

## 2015-01-22 DIAGNOSIS — I25810 Atherosclerosis of coronary artery bypass graft(s) without angina pectoris: Secondary | ICD-10-CM | POA: Diagnosis not present

## 2015-01-22 DIAGNOSIS — Z79899 Other long term (current) drug therapy: Secondary | ICD-10-CM | POA: Diagnosis not present

## 2015-01-22 DIAGNOSIS — R001 Bradycardia, unspecified: Secondary | ICD-10-CM

## 2015-01-22 LAB — CBC
HCT: 46.5 % (ref 39.0–52.0)
Hemoglobin: 16.4 g/dL (ref 13.0–17.0)
MCH: 29.3 pg (ref 26.0–34.0)
MCHC: 35.3 g/dL (ref 30.0–36.0)
MCV: 83.2 fL (ref 78.0–100.0)
MPV: 9.9 fL (ref 8.6–12.4)
Platelets: 276 10*3/uL (ref 150–400)
RBC: 5.59 MIL/uL (ref 4.22–5.81)
RDW: 14.2 % (ref 11.5–15.5)
WBC: 10.8 10*3/uL — AB (ref 4.0–10.5)

## 2015-01-22 NOTE — Progress Notes (Signed)
Cardiology Office Note   Date:  01/22/2015   ID:  [name], DOB 04-May-1956, MRN [id number]  PCP:  No PCP Per Patient  Cardiologist:  Dr. Ellyn Rangel    Chief Complaint  Patient presents with  . Follow-up    No complaints of chest pain.  SOB over the past several months with minimal activity.  No edema or dizziness.  Scheduled for a CT Scan ordered by his neurologist Dr. Glenna Rangel for numbness of fingers and back pain.      History of Present Illness: [name] is a 59 y.o. male who presents for follow up of his CAD- and coronary bypass grafting in 2002. He had a LIMA to the LAD, a VG to the ramus intermediate, SVG to the OM and SVG to the PDA. He had stenting of both vein grafts to the PDA and the OM in 2011 with bare metal stent. He had a PDA with a VeriFLEX stent. He underwent cardiac catheterization on 11/29/12 for symptoms of unstable angina. EF at that time was 49% with mild inferolateral hypokinesis. It was noted he had an occluded vein graft to the OM with inadequate collateral flow. There was moderate in-stent restenosis in the vein graft to the RPDA. The LIMA was patent as was the ramus intermediate, there was a significant lesion obstructing the retrograde flow to the other branch of the ramus. He underwent successful complex PCI to a diffuse AV groove circumflex stenosis with extensive AngioSculpt. A Xience Xpedition stent DES was placed.  Last cath 2015 with patent stents and grafts were patent.    Cath 2015: Marland Kitchen Left main; 60-70% ostial  2. LAD; occluded in its proximal portion. There is a 95% segmental ostial/proximal stenosis followed by a large septal perforator after which the LAD was occluded. 3. Left circumflex; the stented segment was widely patent. There was a large ramus branch that was bypassed. The branch bifurcated after the insertion of the vein graft and had an 80% stenosis in the inferior branch of the bifurcation unchanged from prior cath.  4.  Right coronary artery; dominant and occluded in the midportion 5.LIMA TO LAD; widely patent 6. SVG TO to the ramus branch was widely patent  SVG TO to the PDA had a 50% aorto ostial stenosis unchanged from the prior cath  SVG TO to the marginal branch was occluded at the aorta unchanged from the prior cath 7. Left ventriculography; RAO left ventriculogram was performed using  25 mL of Visipaque dye at 12 mL/second. The overall LVEF estimated  60 % Without wall motion abnormalities   Today he presents after not being seen for greater than a year.  He complains of increased DOE with doing any activity.  He also has increased fatigue.   He is concerned one of his vessels is blocked again.  We discussed a Nuclear study, but in the past they were normal and he needed either CABG or stent.    He prefers the cath.  Also his PPM placed 2006 for 5 sec. Asystole.  We did interrogate today and he is approaching ERI.  He has 6 months left.  RicardoTaylor placed device.  We will send back to him for consult on replacement.  Dr. Elisabeth Rangel had followed and told the pt he may not need, the 5 sec pause could have been with sleep apnea.  We will leave to Dr. Lovena Rangel.  Additionally pt on steroids for arthritic pain and possible cervical stenosis.  He is  followed by [name] and has CT of neck scheduled for this pm.     No recent lipids.  Intolerant to lipitor, crestor, and zocor.  He also has trouble affording meds.  He has asked for a letter to be sent to [facility] poser to help him with power bills as extreme temps may cause increased cardiac issues.  We did fax a letter.    Past Medical History  Diagnosis Date  . Anginal pain   . OSA on CPAP    . Bradycardia by electrocardiogram 2006    Medtronic device  . Tremor, essential, treated with propranolol    . HTN (hypertension)    . Dyslipidemia, with intolerance to Crestor and zocor and possible cough with Lipitor    . Back pain, chronic    . Obesity (BMI  30-39.9)   . CAD (coronary artery disease), with CABG LIMA-LAD, VG-ramus intermediate, SVG-OM, SVG-PDA 2002  . CAD (coronary artery disease) of artery bypass graft, with stent to VG-OM-(BMS)      Past Surgical History  Procedure Laterality Date  . Back surgery    . Coronary artery bypass graft  04/20/2001    LIMA to LAD;SVG to RAMUS INTERMEDIATE;SVG to  obtuse marginal ; SNG to POSTERIOR DESCENDING  . Doppler echocardiography  05/25/2010    EF =>55%,NORMAL LEFT VENTRICULAR SYSTOLIC DYSFUNCTION  . Doppler echocardiography  05/10/2002  . Nm myocar single w/spect  04/28/2000    EF 52%--INFERIOR SCAR MID APEX WITH MINMAL PERI-INFARCT ISCHEMIA  . Insert / replace / remove pacemaker  dec 6,2006    Medtronic device--vitatron model [id number] serial [id number]  . Cardiac catheterization  01/2013  . Cardiac catheterization  11/28/2012    ef 45%-  . Cardiac catheterization  05/24/2007    revealing patent grafts  with no significant disease at the graft insertion EF 60%  . Cardiac catheterization  may 2010    patent LIMA to LAD , patent vein graft to PDA with 30% to 40% ostial stenosis,patent vein graft to diag with  60% stenosis in the diag view on the vein graft;patent vein graft second OM with a smooth 60% to 70% LESION PROXIMALLY FOR IVUS which did not meet criteria for PCI  . Coronary angioplasty  01/17/13  . Coronary angioplasty with stent placement  march 2006    ramus beyond the vein graft  Taxus 2.5 x 16 mm stent  . Coronary angioplasty with stent placement   07/2010    SVG to RCA with 3.0 x 13 mm bare -metal stent   . Coronary angioplasty with stent placement  09/01/2010    PCI to SVG to OM with 4.0 x 32 mm BARE-METAL   . Left heart catheterization with coronary angiogram N/A 11/29/2012    Procedure: LEFT HEART CATHETERIZATION WITH CORONARY ANGIOGRAM;  Surgeon: Ricardo Man, MD;  Location: [facility] CATH LAB;  Service: Cardiovascular;  Laterality: N/A;  . Left heart catheterization with  coronary/graft angiogram N/A 01/17/2013    Procedure: LEFT HEART CATHETERIZATION WITH Beatrix Fetters;  Surgeon: Ricardo Sine, MD;  Location: [facility] CATH LAB;  Service: Cardiovascular;  Laterality: N/A;  . Left heart catheterization with coronary/graft angiogram N/A 10/29/2013    Procedure: LEFT HEART CATHETERIZATION WITH Beatrix Fetters;  Surgeon: Lorretta Harp, MD;  Location: [facility] CATH LAB;  Service: Cardiovascular;  Laterality: N/A;     Current Outpatient Prescriptions  Medication Sig Dispense Refill  . aspirin EC 81 MG tablet Take 81 mg by mouth daily.    Marland Kitchen  clonazePAM (KLONOPIN) 2 MG tablet Take 1 tablet (2 mg total) by mouth at bedtime. 30 tablet 0  . cyclobenzaprine (FLEXERIL) 10 MG tablet Take 1 tablet by mouth as needed.    Marland Kitchen HYDROcodone-acetaminophen (NORCO/VICODIN) 5-325 MG per tablet Take 1 tablet by mouth every 6 (six) hours as needed for moderate pain.    . nitroGLYCERIN (NITROSTAT) 0.4 MG SL tablet Place 0.4 mg under the tongue every 5 (five) minutes as needed for chest pain.    . predniSONE (DELTASONE) 20 MG tablet Take 10 mg by mouth daily.    . propranolol (INDERAL) 40 MG tablet Take 1 tablet (40 mg total) by mouth 3 (three) times daily. 90 tablet 6  . QUEtiapine Fumarate (SEROQUEL XR) 150 MG 24 hr tablet Take 1 tablet by mouth as needed.    . sulfaSALAzine (AZULFIDINE) 500 MG tablet Take 1,000 mg by mouth 2 (two) times daily.     No current facility-administered medications for this visit.    Allergies:   Crestor and Zocor    Social History:  The patient  reports that he has never smoked. He has never used smokeless tobacco. He reports that he does not drink alcohol or use illicit drugs.   Family History:  The patient's family history includes Heart disease in his father; Hyperlipidemia in his father; Hypertension in his father.    ROS:  General:no colds or fevers, no weight changes Skin:no rashes or ulcers HEENT:no blurred vision, no congestion CV:see  HPI PUL:see HPI GI:no diarrhea constipation or melena, no indigestion GU:no hematuria, no dysuria MS:no joint pain, no claudication Neuro:no syncope, no lightheadedness Endo:no diabetes, no thyroid disease  Wt Readings from Last 3 Encounters:  01/22/15 266 lb 14.4 oz (121.065 kg)  11/09/13 268 lb (121.564 kg)  10/29/13 260 lb (117.935 kg)     PHYSICAL EXAM: VS:  BP 150/91 mmHg  Pulse 70  Ht 5\' 10"  (1.778 m)  Wt 266 lb 14.4 oz (121.065 kg)  BMI 38.30 kg/m2 , BMI Body mass index is 38.3 kg/(m^2). General:Pleasant affect, NAD Skin:Warm and dry, brisk capillary refill HEENT:normocephalic, sclera clear, mucus membranes moist Neck:supple, no JVD, no bruits, no adenopathy  Heart:S1S2 RRR without murmur, gallup, rub or click Lungs:clear without rales, rhonchi, or wheezes [id number], non tender, + BS, do not palpate liver spleen or masses Ext:no lower ext edema, 2+ pedal pulses, 2+ radial pulses Neuro:alert and oriented X 3, MAE, follows commands, + facial symmetry  Pacer interrogation RRT 2.62V with impedance @ +3KiliOhms  Recheck in 3 months. +PVCs, pt has no remote device to check his device from.  Device functioning as programmed.   EKG:  EKG is ordered today. The ekg ordered today demonstrates not done, will do on arrival to Short stay.   Recent Labs: No results found for requested labs within last 365 days.    Lipid Panel    Component Value Date/Time   CHOL  03/05/2009 0640    194        ATP III CLASSIFICATION:  <200     mg/dL   Desirable  200-239  mg/dL   Borderline High  >=240    mg/dL   High          TRIG 388* 03/05/2009 0640   HDL 24* 03/05/2009 0640   CHOLHDL 8.1 03/05/2009 0640   VLDL 78* 03/05/2009 0640   LDLCALC  03/05/2009 0640    92        Total Cholesterol/HDL:CHD Risk Coronary Heart Disease Risk Table  Men   Women  1/2 Average Risk   3.4   3.3  Average Risk       5.0   4.4  2 X Average Risk   9.6   7.1  3 X Average Risk  23.4    11.0        Use the calculated Patient Ratio above and the CHD Risk Table to determine the patient's CHD Risk.        ATP III CLASSIFICATION (LDL):  <100     mg/dL   Optimal  100-129  mg/dL   Near or Above                    Optimal  130-159  mg/dL   Borderline  160-189  mg/dL   High  >190     mg/dL   Very High       Other studies Reviewed: Additional studies/ records that were reviewed today include: previous caths.   ASSESSMENT AND PLAN:  Increased DOE and extreme fatigue.  Will need cardiac cath to further determined his stage of coronary disease. This could be anginal equivalent with his dyspnea on exertion has never had chest pain was his previous interventions.  The patient understands that risks included but are not limited to stroke (1 in 1000), death (1 in 48), kidney failure [usually temporary] (1 in 500), bleeding (1 in 200), allergic reaction [possibly serious] (1 in 200).  Patient and his wife willing to proceed with procedure.    CAD (coronary artery disease) of artery bypass graft, with stent to VG-OM-(BMS) & VG-PDA,in 2001, cath 11/2012 tot occ VG-marginal vessels,though + collaterals,other grafts patent/mild dz med.treatment.  No further SOB or chest discomfort.   Hx of NSTEMI (non-ST elevated myocardial infarction), 01/17/13 PCI LCX AV Groove with Xience DES. and angiosculpt. unable to cross occluded OM. EF 40 % Pt had stopped Brilinta due to cost, developed DOE, chest pressure, follow up cath with patent stent. No further symptoms.  OSA on CPAP continues  Permanent pacemaker for 5 seconds asystole.  Medtronic device approaching ERI. Will have patient follow with Dr. Lovena Rangel who placed the device for further evaluation.      Current medicines are reviewed with the patient today.  The patient Has no concerns regarding medicines.  The following changes have been made:  See above Labs/ tests ordered today include:see above  Disposition:   FU:  see  above  Signed, Isaiah Serge, NP  01/22/2015 2:25 PM    [facility] [place], [place], [place] [place], [place] Phone: [phone]; Fax: [phone]

## 2015-01-22 NOTE — Patient Instructions (Signed)
Your physician has requested that you have a cardiac catheterization with grafts tomorrow 01/23/15 by Dr. Gwenlyn Found. Cardiac catheterization is used to diagnose and/or treat various heart conditions. Doctors may recommend this procedure for a number of different reasons. The most common reason is to evaluate chest pain. Chest pain can be a symptom of coronary artery disease (CAD), and cardiac catheterization can show whether plaque is narrowing or blocking your heart's arteries. This procedure is also used to evaluate the valves, as well as measure the blood flow and oxygen levels in different parts of your heart. For further information please visit [email or web address]. Please follow instruction sheet, as given.  Your physician recommends that you return for lab work in: today.  A referral has been placed to Dr. Lovena Le to discuss changing the battery on your pacemaker.

## 2015-01-22 NOTE — Telephone Encounter (Signed)
Letter faxed to [place] to the attention of [name] at fax # [phone] stating the temperature in his house needs to remain stable  due to his medical condition.

## 2015-01-23 LAB — COMPREHENSIVE METABOLIC PANEL
ALK PHOS: 58 U/L (ref 39–117)
ALT: 13 U/L (ref 0–53)
AST: 15 U/L (ref 0–37)
Albumin: 4.5 g/dL (ref 3.5–5.2)
BILIRUBIN TOTAL: 1 mg/dL (ref 0.2–1.2)
BUN: 14 mg/dL (ref 6–23)
CO2: 23 meq/L (ref 19–32)
CREATININE: 1.02 mg/dL (ref 0.50–1.35)
Calcium: 9.3 mg/dL (ref 8.4–10.5)
Chloride: 103 mEq/L (ref 96–112)
Glucose, Bld: 115 mg/dL — ABNORMAL HIGH (ref 70–99)
Potassium: 4.5 mEq/L (ref 3.5–5.3)
Sodium: 137 mEq/L (ref 135–145)
Total Protein: 7.4 g/dL (ref 6.0–8.3)

## 2015-01-23 LAB — TSH: TSH: 0.411 u[IU]/mL (ref 0.350–4.500)

## 2015-01-23 LAB — PROTIME-INR
INR: 0.92 (ref <1.50)
Prothrombin Time: 12.4 seconds (ref 11.6–15.2)

## 2015-01-23 LAB — APTT: APTT: 32 s (ref 24–37)

## 2015-01-24 ENCOUNTER — Other Ambulatory Visit: Payer: Self-pay | Admitting: *Deleted

## 2015-01-24 DIAGNOSIS — R079 Chest pain, unspecified: Secondary | ICD-10-CM

## 2015-01-27 ENCOUNTER — Encounter (HOSPITAL_COMMUNITY): Admission: RE | Payer: Self-pay | Source: Ambulatory Visit

## 2015-01-27 ENCOUNTER — Ambulatory Visit (HOSPITAL_COMMUNITY): Admission: RE | Admit: 2015-01-27 | Payer: Medicare PPO | Source: Ambulatory Visit | Admitting: Cardiovascular Disease

## 2015-01-27 SURGERY — LEFT HEART CATHETERIZATION WITH CORONARY/GRAFT ANGIOGRAM
Anesthesia: LOCAL

## 2015-01-30 ENCOUNTER — Ambulatory Visit: Payer: Medicare PPO | Admitting: Internal Medicine

## 2015-02-10 ENCOUNTER — Encounter: Payer: Self-pay | Admitting: Cardiology

## 2015-02-13 ENCOUNTER — Telehealth: Payer: Self-pay | Admitting: Cardiology

## 2015-02-13 DIAGNOSIS — Z01810 Encounter for preprocedural cardiovascular examination: Secondary | ICD-10-CM

## 2015-02-13 NOTE — Telephone Encounter (Signed)
Pt called in, reports fatgiue, dyspnea, indigestion. Reports symptoms present for 1 month. Asked about any changes, medication or o/w.  Pt notes no recent med chgs w/e of PCP starting him on lisinopril/HCTZ 20/12.5mg  daily ~1 week ago.  Advised to keep on schedule for cath - had initially been scheduled for 4/18 but was cancelled. He'd had labwork prior to proc.  As cath was rescheduled, reordered labwork so that pt can get drawn week of cath. He plans to present to [facility] on Monday 9th - his cath on Friday 5/13.  Informed pt best course of action at this point to remain on schedule for cath. Will route to Dr. Ellyn Hack for additional advice.

## 2015-02-13 NOTE — Telephone Encounter (Signed)
[name], is calling because he is having a lot fatique and shortness of breath which is getting worse. Please call .Marland Kitchen Thanks

## 2015-02-14 NOTE — Telephone Encounter (Signed)
I have not seen the patient for > 2 years.  Not aware of why he was scheduled for cath. I would be flying blind trying to answer ?s without actually seeing him.  Lisinopril does not cause these Sx. --  He was last seen by Cecilie Kicks & was scheduled for Dr. Gwenlyn Found to cath. Will defer to them.  [place]

## 2015-02-19 LAB — COMPREHENSIVE METABOLIC PANEL
ALT: 16 U/L (ref 0–53)
AST: 20 U/L (ref 0–37)
Albumin: 4.2 g/dL (ref 3.5–5.2)
Alkaline Phosphatase: 50 U/L (ref 39–117)
BILIRUBIN TOTAL: 1.2 mg/dL (ref 0.2–1.2)
BUN: 15 mg/dL (ref 6–23)
CO2: 25 mEq/L (ref 19–32)
CREATININE: 1.04 mg/dL (ref 0.50–1.35)
Calcium: 9.2 mg/dL (ref 8.4–10.5)
Chloride: 102 mEq/L (ref 96–112)
Glucose, Bld: 91 mg/dL (ref 70–99)
Potassium: 4.1 mEq/L (ref 3.5–5.3)
SODIUM: 138 meq/L (ref 135–145)
Total Protein: 7.2 g/dL (ref 6.0–8.3)

## 2015-02-19 LAB — CBC
HCT: 46.1 % (ref 39.0–52.0)
HEMOGLOBIN: 15.3 g/dL (ref 13.0–17.0)
MCH: 28.5 pg (ref 26.0–34.0)
MCHC: 33.2 g/dL (ref 30.0–36.0)
MCV: 86 fL (ref 78.0–100.0)
MPV: 9.2 fL (ref 8.6–12.4)
Platelets: 227 10*3/uL (ref 150–400)
RBC: 5.36 MIL/uL (ref 4.22–5.81)
RDW: 14.9 % (ref 11.5–15.5)
WBC: 7.9 10*3/uL (ref 4.0–10.5)

## 2015-02-19 LAB — PROTIME-INR
INR: 1.04 (ref <1.50)
PROTHROMBIN TIME: 13.6 s (ref 11.6–15.2)

## 2015-02-19 LAB — APTT: aPTT: 32 seconds (ref 24–37)

## 2015-02-19 LAB — TSH: TSH: 0.655 u[IU]/mL (ref 0.350–4.500)

## 2015-02-21 ENCOUNTER — Encounter (HOSPITAL_COMMUNITY)
Admission: RE | Disposition: A | Discharge status: Home / Self Care | Payer: Medicare PPO | Source: Ambulatory Visit | Attending: Cardiology

## 2015-02-21 ENCOUNTER — Ambulatory Visit (HOSPITAL_COMMUNITY)
Admission: RE | Admit: 2015-02-21 | Discharge: 2015-02-21 | Disposition: A | Discharge status: Home / Self Care | Payer: Medicare PPO | Source: Ambulatory Visit | Attending: Cardiology | Admitting: Cardiology

## 2015-02-21 DIAGNOSIS — I10 Essential (primary) hypertension: Secondary | ICD-10-CM | POA: Diagnosis present

## 2015-02-21 DIAGNOSIS — Z955 Presence of coronary angioplasty implant and graft: Secondary | ICD-10-CM | POA: Diagnosis not present

## 2015-02-21 DIAGNOSIS — I25810 Atherosclerosis of coronary artery bypass graft(s) without angina pectoris: Secondary | ICD-10-CM | POA: Diagnosis not present

## 2015-02-21 DIAGNOSIS — I252 Old myocardial infarction: Secondary | ICD-10-CM | POA: Diagnosis not present

## 2015-02-21 DIAGNOSIS — R0609 Other forms of dyspnea: Secondary | ICD-10-CM | POA: Diagnosis present

## 2015-02-21 DIAGNOSIS — R0602 Shortness of breath: Secondary | ICD-10-CM | POA: Diagnosis present

## 2015-02-21 DIAGNOSIS — I2582 Chronic total occlusion of coronary artery: Secondary | ICD-10-CM | POA: Insufficient documentation

## 2015-02-21 DIAGNOSIS — G4733 Obstructive sleep apnea (adult) (pediatric): Secondary | ICD-10-CM | POA: Diagnosis not present

## 2015-02-21 DIAGNOSIS — I2510 Atherosclerotic heart disease of native coronary artery without angina pectoris: Secondary | ICD-10-CM | POA: Diagnosis not present

## 2015-02-21 DIAGNOSIS — R079 Chest pain, unspecified: Secondary | ICD-10-CM

## 2015-02-21 DIAGNOSIS — E785 Hyperlipidemia, unspecified: Secondary | ICD-10-CM | POA: Diagnosis not present

## 2015-02-21 DIAGNOSIS — Z950 Presence of cardiac pacemaker: Secondary | ICD-10-CM | POA: Diagnosis not present

## 2015-02-21 HISTORY — PX: CARDIAC CATHETERIZATION: SHX172

## 2015-02-21 SURGERY — LEFT HEART CATH AND CORS/GRAFTS ANGIOGRAPHY

## 2015-02-21 MED ORDER — SODIUM CHLORIDE 0.9 % IV SOLN: 250.0000 mL | INTRAVENOUS | Status: DC | PRN

## 2015-02-21 MED ORDER — IOHEXOL 350 MG/ML SOLN
INTRAVENOUS | Status: DC | PRN
  Administered 2015-02-21: 145 mL via INTRA_ARTERIAL

## 2015-02-21 MED ORDER — HEPARIN (PORCINE) IN NACL 2-0.9 UNIT/ML-% IJ SOLN
INTRAMUSCULAR | Status: AC
  Filled 2015-02-21: qty 1500

## 2015-02-21 MED ORDER — FENTANYL CITRATE (PF) 100 MCG/2ML IJ SOLN
INTRAMUSCULAR | Status: DC | PRN
  Administered 2015-02-21: 25 ug via INTRAVENOUS

## 2015-02-21 MED ORDER — SODIUM CHLORIDE 0.9 % IJ SOLN: 3.0000 mL | INTRAMUSCULAR | Status: DC | PRN

## 2015-02-21 MED ORDER — SODIUM CHLORIDE 0.9 % WEIGHT BASED INFUSION
3.0000 mL/kg/h | INTRAVENOUS | Status: DC
  Administered 2015-02-21: 3 mL/kg/h via INTRAVENOUS

## 2015-02-21 MED ORDER — VERAPAMIL HCL 2.5 MG/ML IV SOLN
INTRAVENOUS | Status: DC | PRN
  Administered 2015-02-21: 09:00:00 via INTRA_ARTERIAL

## 2015-02-21 MED ORDER — LIDOCAINE HCL (PF) 1 % IJ SOLN
INTRAMUSCULAR | Status: AC
  Filled 2015-02-21: qty 30

## 2015-02-21 MED ORDER — MIDAZOLAM HCL 2 MG/2ML IJ SOLN
INTRAMUSCULAR | Status: AC
  Filled 2015-02-21: qty 2

## 2015-02-21 MED ORDER — MIDAZOLAM HCL 2 MG/2ML IJ SOLN
INTRAMUSCULAR | Status: DC | PRN
  Administered 2015-02-21: 2 mg via INTRAVENOUS

## 2015-02-21 MED ORDER — ASPIRIN 81 MG PO CHEW
CHEWABLE_TABLET | ORAL | Status: AC
  Administered 2015-02-21: 81 mg
  Filled 2015-02-21: qty 1

## 2015-02-21 MED ORDER — SODIUM CHLORIDE 0.9 % IV SOLN
INTRAVENOUS | Status: DC
  Administered 2015-02-21: 07:00:00 via INTRAVENOUS

## 2015-02-21 MED ORDER — HEPARIN SODIUM (PORCINE) 1000 UNIT/ML IJ SOLN
INTRAMUSCULAR | Status: DC | PRN
  Administered 2015-02-21: 6000 [IU] via INTRAVENOUS

## 2015-02-21 MED ORDER — HEPARIN SODIUM (PORCINE) 1000 UNIT/ML IJ SOLN
INTRAMUSCULAR | Status: AC
  Filled 2015-02-21: qty 1

## 2015-02-21 MED ORDER — VERAPAMIL HCL 2.5 MG/ML IV SOLN
INTRAVENOUS | Status: AC
Start: 2015-02-21 — End: 2015-02-21
  Filled 2015-02-21: qty 2

## 2015-02-21 MED ORDER — SODIUM CHLORIDE 0.9 % IJ SOLN: 3.0000 mL | Freq: Two times a day (BID) | INTRAMUSCULAR | Status: DC

## 2015-02-21 MED ORDER — FENTANYL CITRATE (PF) 100 MCG/2ML IJ SOLN
INTRAMUSCULAR | Status: AC
  Filled 2015-02-21: qty 2

## 2015-02-21 SURGICAL SUPPLY — 18 items
CATH INFINITI 5 FR IM (CATHETERS) ×2 IMPLANT
CATH INFINITI 5 FR JL3.5 (CATHETERS) ×3 IMPLANT
CATH INFINITI 5 FR MPA2 (CATHETERS) ×2 IMPLANT
CATH INFINITI 5FR AL1 (CATHETERS) ×2 IMPLANT
CATH INFINITI 5FR ANG PIGTAIL (CATHETERS) ×3 IMPLANT
CATH INFINITI 5FR MULTPACK ANG (CATHETERS) IMPLANT
CATH INFINITI JR4 5F (CATHETERS) ×3 IMPLANT
CATH LAUNCHER 5F EBU4.0 (CATHETERS) ×2 IMPLANT
DEVICE RAD COMP TR BAND LRG (VASCULAR PRODUCTS) ×3 IMPLANT
GLIDESHEATH SLEND SS 6F .021 (SHEATH) ×3 IMPLANT
KIT HEART LEFT (KITS) ×3 IMPLANT
PACK CARDIAC CATHETERIZATION (CUSTOM PROCEDURE TRAY) ×3 IMPLANT
SHEATH PINNACLE 5F 10CM (SHEATH) IMPLANT
SYR MEDRAD MARK V 150ML (SYRINGE) ×3 IMPLANT
TRANSDUCER W/STOPCOCK (MISCELLANEOUS) ×3 IMPLANT
TUBING CIL FLEX 10 FLL-RA (TUBING) ×3 IMPLANT
WIRE EMERALD 3MM-J .035X150CM (WIRE) IMPLANT
WIRE SAFE-T 1.5MM-J .035X260CM (WIRE) ×3 IMPLANT

## 2015-02-21 NOTE — H&P (View-Only) (Signed)
Cardiology Office Note   Date:  01/22/2015   ID:  KOMARI PRZYWARA, DOB 1956/07/31, MRN [id number]  PCP:  No PCP Per Patient  Cardiologist:  Dr. Ellyn Hack    Chief Complaint  Patient presents with  . Follow-up    No complaints of chest pain.  SOB over the past several months with minimal activity.  No edema or dizziness.  Scheduled for a CT Scan ordered by his neurologist Dr. Glenna Fellows for numbness of fingers and back pain.      History of Present Illness: ADRICK SCHNEIDERS is a 59 y.o. male who presents for follow up of his CAD- and coronary bypass grafting in 2002. He had a LIMA to the LAD, a VG to the ramus intermediate, SVG to the OM and SVG to the PDA. He had stenting of both vein grafts to the PDA and the OM in 2011 with bare metal stent. He had a PDA with a VeriFLEX stent. He underwent cardiac catheterization on 11/29/12 for symptoms of unstable angina. EF at that time was 49% with mild inferolateral hypokinesis. It was noted he had an occluded vein graft to the OM with inadequate collateral flow. There was moderate in-stent restenosis in the vein graft to the RPDA. The LIMA was patent as was the ramus intermediate, there was a significant lesion obstructing the retrograde flow to the other branch of the ramus. He underwent successful complex PCI to a diffuse AV groove circumflex stenosis with extensive AngioSculpt. A Xience Xpedition stent DES was placed.  Last cath 2015 with patent stents and grafts were patent.    Cath 2015: Marland Kitchen Left main; 60-70% ostial  2. LAD; occluded in its proximal portion. There is a 95% segmental ostial/proximal stenosis followed by a large septal perforator after which the LAD was occluded. 3. Left circumflex; the stented segment was widely patent. There was a large ramus branch that was bypassed. The branch bifurcated after the insertion of the vein graft and had an 80% stenosis in the inferior branch of the bifurcation unchanged from prior cath.  4.  Right coronary artery; dominant and occluded in the midportion 5.LIMA TO LAD; widely patent 6. SVG TO to the ramus branch was widely patent  SVG TO to the PDA had a 50% aorto ostial stenosis unchanged from the prior cath  SVG TO to the marginal branch was occluded at the aorta unchanged from the prior cath 7. Left ventriculography; RAO left ventriculogram was performed using  25 mL of Visipaque dye at 12 mL/second. The overall LVEF estimated  60 % Without wall motion abnormalities   Today he presents after not being seen for greater than a year.  He complains of increased DOE with doing any activity.  He also has increased fatigue.   He is concerned one of his vessels is blocked again.  We discussed a Nuclear study, but in the past they were normal and he needed either CABG or stent.    He prefers the cath.  Also his PPM placed 2006 for 5 sec. Asystole.  We did interrogate today and he is approaching ERI.  He has 6 months left.  Dr.Taylor placed device.  We will send back to him for consult on replacement.  Dr. Elisabeth Cara had followed and told the pt he may not need, the 5 sec pause could have been with sleep apnea.  We will leave to Dr. Lovena Le.  Additionally pt on steroids for arthritic pain and possible cervical stenosis.  He is  followed by Dr. Carloyn Manner and has CT of neck scheduled for this pm.     No recent lipids.  Intolerant to lipitor, crestor, and zocor.  He also has trouble affording meds.  He has asked for a letter to be sent to [facility] poser to help him with power bills as extreme temps may cause increased cardiac issues.  We did fax a letter.    Past Medical History  Diagnosis Date  . Anginal pain   . OSA on CPAP    . Bradycardia by electrocardiogram 2006    Medtronic device  . Tremor, essential, treated with propranolol    . HTN (hypertension)    . Dyslipidemia, with intolerance to Crestor and zocor and possible cough with Lipitor    . Back pain, chronic    . Obesity (BMI  30-39.9)   . CAD (coronary artery disease), with CABG LIMA-LAD, VG-ramus intermediate, SVG-OM, SVG-PDA 2002  . CAD (coronary artery disease) of artery bypass graft, with stent to VG-OM-(BMS)      Past Surgical History  Procedure Laterality Date  . Back surgery    . Coronary artery bypass graft  04/20/2001    LIMA to LAD;SVG to RAMUS INTERMEDIATE;SVG to  obtuse marginal ; SNG to POSTERIOR DESCENDING  . Doppler echocardiography  05/25/2010    EF =>55%,NORMAL LEFT VENTRICULAR SYSTOLIC DYSFUNCTION  . Doppler echocardiography  05/10/2002  . Nm myocar single w/spect  04/28/2000    EF 52%--INFERIOR SCAR MID APEX WITH MINMAL PERI-INFARCT ISCHEMIA  . Insert / replace / remove pacemaker  dec 6,2006    Medtronic device--vitatron model [id number] serial [id number]  . Cardiac catheterization  01/2013  . Cardiac catheterization  11/28/2012    ef 45%-  . Cardiac catheterization  05/24/2007    revealing patent grafts  with no significant disease at the graft insertion EF 60%  . Cardiac catheterization  may 2010    patent LIMA to LAD , patent vein graft to PDA with 30% to 40% ostial stenosis,patent vein graft to diag with  60% stenosis in the diag view on the vein graft;patent vein graft second OM with a smooth 60% to 70% LESION PROXIMALLY FOR IVUS which did not meet criteria for PCI  . Coronary angioplasty  01/17/13  . Coronary angioplasty with stent placement  march 2006    ramus beyond the vein graft  Taxus 2.5 x 16 mm stent  . Coronary angioplasty with stent placement   07/2010    SVG to RCA with 3.0 x 13 mm bare -metal stent   . Coronary angioplasty with stent placement  09/01/2010    PCI to SVG to OM with 4.0 x 32 mm BARE-METAL   . Left heart catheterization with coronary angiogram N/A 11/29/2012    Procedure: LEFT HEART CATHETERIZATION WITH CORONARY ANGIOGRAM;  Surgeon: Leonie Man, MD;  Location: [facility] CATH LAB;  Service: Cardiovascular;  Laterality: N/A;  . Left heart catheterization with  coronary/graft angiogram N/A 01/17/2013    Procedure: LEFT HEART CATHETERIZATION WITH Beatrix Fetters;  Surgeon: Troy Sine, MD;  Location: [facility] CATH LAB;  Service: Cardiovascular;  Laterality: N/A;  . Left heart catheterization with coronary/graft angiogram N/A 10/29/2013    Procedure: LEFT HEART CATHETERIZATION WITH Beatrix Fetters;  Surgeon: Lorretta Harp, MD;  Location: [facility] CATH LAB;  Service: Cardiovascular;  Laterality: N/A;     Current Outpatient Prescriptions  Medication Sig Dispense Refill  . aspirin EC 81 MG tablet Take 81 mg by mouth daily.    Marland Kitchen  clonazePAM (KLONOPIN) 2 MG tablet Take 1 tablet (2 mg total) by mouth at bedtime. 30 tablet 0  . cyclobenzaprine (FLEXERIL) 10 MG tablet Take 1 tablet by mouth as needed.    Marland Kitchen HYDROcodone-acetaminophen (NORCO/VICODIN) 5-325 MG per tablet Take 1 tablet by mouth every 6 (six) hours as needed for moderate pain.    . nitroGLYCERIN (NITROSTAT) 0.4 MG SL tablet Place 0.4 mg under the tongue every 5 (five) minutes as needed for chest pain.    . predniSONE (DELTASONE) 20 MG tablet Take 10 mg by mouth daily.    . propranolol (INDERAL) 40 MG tablet Take 1 tablet (40 mg total) by mouth 3 (three) times daily. 90 tablet 6  . QUEtiapine Fumarate (SEROQUEL XR) 150 MG 24 hr tablet Take 1 tablet by mouth as needed.    . sulfaSALAzine (AZULFIDINE) 500 MG tablet Take 1,000 mg by mouth 2 (two) times daily.     No current facility-administered medications for this visit.    Allergies:   Crestor and Zocor    Social History:  The patient  reports that he has never smoked. He has never used smokeless tobacco. He reports that he does not drink alcohol or use illicit drugs.   Family History:  The patient's family history includes Heart disease in his father; Hyperlipidemia in his father; Hypertension in his father.    ROS:  General:no colds or fevers, no weight changes Skin:no rashes or ulcers HEENT:no blurred vision, no congestion CV:see  HPI PUL:see HPI GI:no diarrhea constipation or melena, no indigestion GU:no hematuria, no dysuria MS:no joint pain, no claudication Neuro:no syncope, no lightheadedness Endo:no diabetes, no thyroid disease  Wt Readings from Last 3 Encounters:  01/22/15 266 lb 14.4 oz (121.065 kg)  11/09/13 268 lb (121.564 kg)  10/29/13 260 lb (117.935 kg)     PHYSICAL EXAM: VS:  BP 150/91 mmHg  Pulse 70  Ht 5\' 10"  (1.778 m)  Wt 266 lb 14.4 oz (121.065 kg)  BMI 38.30 kg/m2 , BMI Body mass index is 38.3 kg/(m^2). General:Pleasant affect, NAD Skin:Warm and dry, brisk capillary refill HEENT:normocephalic, sclera clear, mucus membranes moist Neck:supple, no JVD, no bruits, no adenopathy  Heart:S1S2 RRR without murmur, gallup, rub or click Lungs:clear without rales, rhonchi, or wheezes [id number], non tender, + BS, do not palpate liver spleen or masses Ext:no lower ext edema, 2+ pedal pulses, 2+ radial pulses Neuro:alert and oriented X 3, MAE, follows commands, + facial symmetry  Pacer interrogation RRT 2.62V with impedance @ +3KiliOhms  Recheck in 3 months. +PVCs, pt has no remote device to check his device from.  Device functioning as programmed.   EKG:  EKG is ordered today. The ekg ordered today demonstrates not done, will do on arrival to Short stay.   Recent Labs: No results found for requested labs within last 365 days.    Lipid Panel    Component Value Date/Time   CHOL  03/05/2009 0640    194        ATP III CLASSIFICATION:  <200     mg/dL   Desirable  200-239  mg/dL   Borderline High  >=240    mg/dL   High          TRIG 388* 03/05/2009 0640   HDL 24* 03/05/2009 0640   CHOLHDL 8.1 03/05/2009 0640   VLDL 78* 03/05/2009 0640   LDLCALC  03/05/2009 0640    92        Total Cholesterol/HDL:CHD Risk Coronary Heart Disease Risk Table  Men   Women  1/2 Average Risk   3.4   3.3  Average Risk       5.0   4.4  2 X Average Risk   9.6   7.1  3 X Average Risk  23.4    11.0        Use the calculated Patient Ratio above and the CHD Risk Table to determine the patient's CHD Risk.        ATP III CLASSIFICATION (LDL):  <100     mg/dL   Optimal  100-129  mg/dL   Near or Above                    Optimal  130-159  mg/dL   Borderline  160-189  mg/dL   High  >190     mg/dL   Very High       Other studies Reviewed: Additional studies/ records that were reviewed today include: previous caths.   ASSESSMENT AND PLAN:  Increased DOE and extreme fatigue.  Will need cardiac cath to further determined his stage of coronary disease. This could be anginal equivalent with his dyspnea on exertion has never had chest pain was his previous interventions.  The patient understands that risks included but are not limited to stroke (1 in 1000), death (1 in 25), kidney failure [usually temporary] (1 in 500), bleeding (1 in 200), allergic reaction [possibly serious] (1 in 200).  Patient and his wife willing to proceed with procedure.    CAD (coronary artery disease) of artery bypass graft, with stent to VG-OM-(BMS) & VG-PDA,in 2001, cath 11/2012 tot occ VG-marginal vessels,though + collaterals,other grafts patent/mild dz med.treatment.  No further SOB or chest discomfort.   Hx of NSTEMI (non-ST elevated myocardial infarction), 01/17/13 PCI LCX AV Groove with Xience DES. and angiosculpt. unable to cross occluded OM. EF 40 % Pt had stopped Brilinta due to cost, developed DOE, chest pressure, follow up cath with patent stent. No further symptoms.  OSA on CPAP continues  Permanent pacemaker for 5 seconds asystole.  Medtronic device approaching ERI. Will have patient follow with Dr. Lovena Le who placed the device for further evaluation.      Current medicines are reviewed with the patient today.  The patient Has no concerns regarding medicines.  The following changes have been made:  See above Labs/ tests ordered today include:see above  Disposition:   FU:  see  above  Signed, Isaiah Serge, NP  01/22/2015 2:25 PM    [facility] [place], [place], [place], [place] Phone: [phone]; Fax: [phone]

## 2015-02-21 NOTE — Discharge Instructions (Signed)
Radial Site Care °Refer to this sheet in the next few weeks. These instructions provide you with information on caring for yourself after your procedure. Your caregiver may also give you more specific instructions. Your treatment has been planned according to current medical practices, but problems sometimes occur. Call your caregiver if you have any problems or questions after your procedure. °HOME CARE INSTRUCTIONS °· You may shower the day after the procedure. Remove the bandage (dressing) and gently wash the site with plain soap and water. Gently pat the site dry. °· Do not apply powder or lotion to the site. °· Do not submerge the affected site in water for 3 to 5 days. °· Inspect the site at least twice daily. °· Do not flex or bend the affected arm for 24 hours. °· No lifting over 5 pounds (2.3 kg) for 5 days after your procedure. °· Do not drive home if you are discharged the same day of the procedure. Have someone else drive you. °· You may drive 24 hours after the procedure unless otherwise instructed by your caregiver. °· Do not operate machinery or power tools for 24 hours. °· A responsible adult should be with you for the first 24 hours after you arrive home. °What to expect: °· Any bruising will usually fade within 1 to 2 weeks. °· Blood that collects in the tissue (hematoma) may be painful to the touch. It should usually decrease in size and tenderness within 1 to 2 weeks. °SEEK IMMEDIATE MEDICAL CARE IF: °· You have unusual pain at the radial site. °· You have redness, warmth, swelling, or pain at the radial site. °· You have drainage (other than a small amount of blood on the dressing). °· You have chills. °· You have a fever or persistent symptoms for more than 72 hours. °· You have a fever and your symptoms suddenly get worse. °· Your arm becomes pale, cool, tingly, or numb. °· You have heavy bleeding from the site. Hold pressure on the site. °Document Released: 10/30/2010 Document Revised:  12/20/2011 Document Reviewed: 10/30/2010 °ExitCare® Patient Information ©2015 [employer]. This information is not intended to replace advice given to you by your health care provider. Make sure you discuss any questions you have with your health care provider. ° °

## 2015-02-21 NOTE — Progress Notes (Signed)
Attempted to remove 3cc of air from TRB at 1030 without success. Positive for arterial bleeding. 3cc of air replaced. Hemostasis achieved.

## 2015-02-21 NOTE — Interval H&P Note (Signed)
History and Physical Interval Note:  02/21/2015 8:32 AM  [name]  has presented today for surgery, with the diagnosis of cp  The various methods of treatment have been discussed with the patient and family. After consideration of risks, benefits and other options for treatment, the patient has consented to  Procedure(s): Left Heart Cath and Cors/Grafts Angiography (N/A) as a surgical intervention .  The patient's history has been reviewed, patient examined, no change in status, stable for surgery.  I have reviewed the patient's chart and labs.  Questions were answered to the patient's satisfaction.   Cath Lab Visit (complete for each Cath Lab visit)  Clinical Evaluation Leading to the Procedure:   ACS: No.  Non-ACS:    Anginal Classification: CCS III  Anti-ischemic medical therapy: Maximal Therapy (2 or more classes of medications)  Non-Invasive Test Results: No non-invasive testing performed  Prior CABG: Previous CABG        Collier [facility] 02/21/2015 8:32 AM

## 2015-02-24 MED FILL — Heparin Sodium (Porcine) 2 Unit/ML in Sodium Chloride 0.9%: INTRAMUSCULAR | Qty: 1000 | Status: AC

## 2015-02-24 MED FILL — Lidocaine HCl Local Preservative Free (PF) Inj 1%: INTRAMUSCULAR | Qty: 30 | Status: AC

## 2015-02-25 ENCOUNTER — Encounter (HOSPITAL_COMMUNITY): Payer: Self-pay | Admitting: Cardiology

## 2015-02-27 ENCOUNTER — Other Ambulatory Visit: Payer: Self-pay | Admitting: *Deleted

## 2015-03-03 NOTE — Telephone Encounter (Signed)
Pt's cath was stable, he should follow up with his PCP that ordered the meds.  He needs follow up with Dr. Ellyn Hack in 3 months.

## 2015-03-03 NOTE — Telephone Encounter (Signed)
Called patient, goes to VM box that has not been set up.

## 2015-03-05 NOTE — H&P (Signed)
Ricardo Fears, MD   Biagio Borg, PA-C [address], [place], [place]  [address]                             [phone]   ORTHOPAEDIC HISTORY & PHYSICAL  CORDIE BUENING MRN:  [id number] DOB/SEX:  [birth year]/male  CHIEF COMPLAINT:  Painful right hand  HISTORY: Mr. Landini is 59 years old and visits the office today for evaluation of bilateral hand and wrist pain associated with numbness and tingling.  Mr. Digilio is Dr. Arlean Hopping and [facility].  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  He is presently on sulfasalazine EN 2 pills p.o. b.i.d. and this seems to be making a difference.  He has evidence of carpal tunnel syndrome based on [facility] evaluation and he was referred to Dr. Ernestina Patches for EMGs and nerve conduction studies.  These were performed on Friday.  The preliminary report is moderate to severe carpal tunnel syndrome bilaterally.  He is right-hand dominant.  Mr. Edmonston relates that he is wearing the splint mostly on the right hand, but is having bilateral symptoms.  He is dropping objects and having trouble sleeping at night, waking up almost once an hour, and has just been "miserable."  He has been having trouble now for about 6 or 7 months and there is no history of injury or trauma.  He is disabled.  He has been on disability for many years on the basis of his neck and his back.  He is being followed and treated by Dr. Carloyn Manner.  Has had several surgeries, i.e., 3 in his lumbar spine and 3 in the cervical spine.  Dr. Carloyn Manner recently evaluated him and did not feel that the problem with his hands was related to his cervical spine.  EMG'S revealed moderate to severe carpal tunnel syndrome.   PAST MEDICAL HISTORY: Patient Active Problem List   Diagnosis Date Noted  . Shingles 11/11/2013  . DOE (dyspnea on exertion), anginal equivalant 09/21/2013  . NSTEMI (non-ST elevated myocardial infarction), 01/17/13 PCI LCX AV Groove with Xience DES. and  angiosculpt. unable to cross occluded OM.  EF 40 % 11/28/2012  . CAD-CABG 2002 (LIMA-LAD, VG-ramus intermediate, SVG-OM, SVG-PDA) s/p Cath 11/29/12-total occulsion of SVG-OM, EF of 45% 11/28/2012  . OSA on CPAP 11/28/2012  . 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 Tremor, essential, treated with propranolol 11/28/2012  . HTN (hypertension) 11/28/2012  . Dyslipidemia, with intolerance to Crestor and zocor and possible cough with Lipitor 11/28/2012  . Back pain, chronic 11/28/2012  . CAD (coronary artery disease) of artery bypass graft, with stent to VG-OM-(BMS) & VG-PDA,in 2001, cath 11/2012 tot occ VG-marginal vessels,though + collaterals,other grafts patent/mild dz med.treatmen  11/28/2012   Past Medical History  Diagnosis Date  . Anginal pain   . OSA on CPAP    . Bradycardia by electrocardiogram 2006    Medtronic device  . Tremor, essential, treated with propranolol    . HTN (hypertension)    . Dyslipidemia, with intolerance to Crestor and zocor and possible cough with Lipitor    . Back pain, chronic    . Obesity (BMI 30-39.9)   . CAD (coronary artery disease), with CABG LIMA-LAD, VG-ramus intermediate, SVG-OM, SVG-PDA 2002  . CAD (coronary artery disease) of artery bypass graft, with stent to VG-OM-(BMS)     Past Surgical History  Procedure Laterality Date  .  Back surgery    . Coronary artery bypass graft  04/20/2001    LIMA to LAD;SVG to RAMUS INTERMEDIATE;SVG to  obtuse marginal ; SNG to POSTERIOR DESCENDING  . Doppler echocardiography  05/25/2010    EF =>55%,NORMAL LEFT VENTRICULAR SYSTOLIC DYSFUNCTION  . Doppler echocardiography  05/10/2002  . Nm myocar single w/spect  04/28/2000    EF 52%--INFERIOR SCAR MID APEX WITH MINMAL PERI-INFARCT ISCHEMIA  . Insert / replace / remove pacemaker  dec 6,2006    Medtronic device--vitatron model [id number] serial [id number]  . Cardiac catheterization  01/2013  . Cardiac catheterization   11/28/2012    ef 45%-  . Cardiac catheterization  05/24/2007    revealing patent grafts  with no significant disease at the graft insertion EF 60%  . Cardiac catheterization  may 2010    patent LIMA to LAD , patent vein graft to PDA with 30% to 40% ostial stenosis,patent vein graft to diag with  60% stenosis in the diag view on the vein graft;patent vein graft second OM with a smooth 60% to 70% LESION PROXIMALLY FOR IVUS which did not meet criteria for PCI  . Coronary angioplasty  01/17/13  . Coronary angioplasty with stent placement  march 2006    ramus beyond the vein graft  Taxus 2.5 x 16 mm stent  . Coronary angioplasty with stent placement   07/2010    SVG to RCA with 3.0 x 13 mm bare -metal stent   . Coronary angioplasty with stent placement  09/01/2010    PCI to SVG to OM with 4.0 x 32 mm BARE-METAL   . Left heart catheterization with coronary angiogram N/A 11/29/2012    Procedure: LEFT HEART CATHETERIZATION WITH CORONARY ANGIOGRAM;  Surgeon: Leonie Man, MD;  Location[facility] CATH LAB;  Service: Cardiovascular;  Laterality: N/A;  . Left heart catheterization with coronary/graft angiogram N/A 01/17/2013    Procedure: LEFT HEART CATHETERIZATION WITH Beatrix Fetters;  Surgeon: Troy Sine, MD;  Location: [facility] CATH LAB;  Service: Cardiovascular;  Laterality: N/A;  . Left heart catheterization with coronary/graft angiogram N/A 10/29/2013    Procedure: LEFT HEART CATHETERIZATION WITH Beatrix Fetters;  Surgeon: Lorretta Harp, MD;  Location: [facility] CATH LAB;  Service: Cardiovascular;  Laterality: N/A;  . Cardiac catheterization N/A 02/21/2015    Procedure: Left Heart Cath and Cors/Grafts Angiography;  Surgeon: Peter M Martinique, MD;  Location: [facility] CV LAB;  Service: Cardiovascular;  Laterality: N/A;     MEDICATIONS:   No prescriptions prior to admission    ALLERGIES:   Allergies  Allergen Reactions  . Crestor [Rosuvastatin] Other (See Comments)    REACTION: Muscle aches    . Lipitor [Atorvastatin] Cough    ? cough  . Zocor [Simvastatin] Other (See Comments)    REACTION: Muscle aches    REVIEW OF SYSTEMS:  A comprehensive review of systems was negative except for: Systemic review is significant for pain and tenderness, disturbed sleep pattern.  No dry eyes or dry mouth.  No Raynaud's, mucosal ulcers, lymphadenopathy, other rashes, photosensitivity.  No diarrhea, no constipation, no fatigue.  Some morning stiffness.  No trouble with stairs, chair, car, toilet and currently having trouble with caps, buttons, cutlery and writing secondary to the carpal tunnel syndrome bilaterally.   Positive for MI and HTN.   FAMILY HISTORY:   Family History  Problem Relation Age of Onset  . Heart disease Father   . Hyperlipidemia Father   . Hypertension Father     SOCIAL  HISTORY:   History  Substance Use Topics  . Smoking status: Never Smoker   . Smokeless tobacco: Never Used  . Alcohol Use: No      EXAMINATION: Vital signs in last 24 hours:    Head is normocephalic.   Eyes:  Pupils equal, round and reactive to light and accommodation.  Extraocular intact. ENT: Ears, nose, and throat were benign.   Neck: supple, no bruits were noted.   Chest: good expansion.   Lungs: essentially clear.   Cardiac: regular rhythm and rate, normal S1, S2.  No murmurs appreciated. Pulses :  2+ bilateral and symmetric in upper extremities. Abdomen is scaphoid, soft, nontender, no masses palpable, normal bowel sounds present. CNS:  He is oriented x3 and cranial nerves II-XII grossly intact. Breast, rectal, and genital exams: not performed and not indicated for an orthopedic evaluation. Musculoskeletal: He also has a tremor that has apparently been evaluated by Dr. Carloyn Manner, but he was able to appose his thumb to his little finger. Positive tinel's    ASSESSMENT: right carpal tunnel syndrome  Past Medical History  Diagnosis Date  . Anginal pain   . OSA on CPAP    . Bradycardia  by electrocardiogram 2006    Medtronic device  . Tremor, essential, treated with propranolol    . HTN (hypertension)    . Dyslipidemia, with intolerance to Crestor and zocor and possible cough with Lipitor    . Back pain, chronic    . Obesity (BMI 30-39.9)   . CAD (coronary artery disease), with CABG LIMA-LAD, VG-ramus intermediate, SVG-OM, SVG-PDA 2002  . CAD (coronary artery disease) of artery bypass graft, with stent to VG-OM-(BMS)      PLAN: Plan for right Carpal tunnel release  The procedure,  risks, and benefits of surgery were presented and reviewed. The risks including but not limited to infection, blood clots, vascular and nerve injury, stiffness,  among others were discussed. The patient acknowledged the explanation, agreed to proceed.   Mike Craze Thatcher, [place] [phone]  03/05/2015 8:58 AM

## 2015-03-06 ENCOUNTER — Ambulatory Visit (HOSPITAL_BASED_OUTPATIENT_CLINIC_OR_DEPARTMENT_OTHER): Admission: RE | Admit: 2015-03-06 | Payer: Medicare PPO | Source: Ambulatory Visit | Admitting: Orthopaedic Surgery

## 2015-03-06 ENCOUNTER — Encounter (HOSPITAL_BASED_OUTPATIENT_CLINIC_OR_DEPARTMENT_OTHER): Admission: RE | Payer: Self-pay | Source: Ambulatory Visit

## 2015-03-06 SURGERY — CARPAL TUNNEL RELEASE
Anesthesia: LOCAL | Site: Wrist | Laterality: Right

## 2015-03-12 ENCOUNTER — Encounter: Payer: Self-pay | Admitting: Internal Medicine

## 2015-03-12 ENCOUNTER — Other Ambulatory Visit: Payer: Self-pay | Admitting: Cardiovascular Disease

## 2015-03-12 ENCOUNTER — Ambulatory Visit (INDEPENDENT_AMBULATORY_CARE_PROVIDER_SITE_OTHER): Payer: Medicare PPO | Admitting: Internal Medicine

## 2015-03-12 VITALS — BP 130/76 | HR 86 | Ht 70.0 in | Wt 267.2 lb

## 2015-03-12 DIAGNOSIS — Z950 Presence of cardiac pacemaker: Secondary | ICD-10-CM | POA: Diagnosis not present

## 2015-03-12 DIAGNOSIS — I10 Essential (primary) hypertension: Secondary | ICD-10-CM | POA: Diagnosis not present

## 2015-03-12 LAB — CUP PACEART INCLINIC DEVICE CHECK
Lead Channel Pacing Threshold Amplitude: 0.5 V
Lead Channel Pacing Threshold Amplitude: 0.75 V
Lead Channel Pacing Threshold Pulse Width: 0.4 ms
Lead Channel Sensing Intrinsic Amplitude: 2.5 mV
Lead Channel Setting Pacing Amplitude: 2 V
Lead Channel Setting Pacing Pulse Width: 0.4 ms
Lead Channel Setting Sensing Sensitivity: 2 mV
MDC IDC MSMT LEADCHNL RV PACING THRESHOLD PULSEWIDTH: 0.4 ms
MDC IDC MSMT LEADCHNL RV SENSING INTR AMPL: 8 mV
MDC IDC SESS DTM: 20160601172302
MDC IDC SET LEADCHNL RA PACING AMPLITUDE: 2 V
MDC IDC STAT BRADY AS VS PERCENT: 99 %
Pulse Gen Serial Number: 2706032752

## 2015-03-12 NOTE — Patient Instructions (Signed)
Medication Instructions:  Your physician recommends that you continue on your current medications as directed. Please refer to the Current Medication list given to you today.   Labwork: NONE  Testing/Procedures: NONE  Follow-Up: Your physician wants you to follow-up in: 6 months with Dr. Taylor. You will receive a reminder letter in the mail two months in advance. If you don't receive a letter, please call our office to schedule the follow-up appointment.   Any Other Special Instructions Will Be Listed Below (If Applicable).   

## 2015-03-12 NOTE — Assessment & Plan Note (Signed)
He is approaching ERI. We reprogrammed his device to OOO mode. We will see if he has any symptomatic bradycardia. If not, then we will consider extraction of the entire system.

## 2015-03-12 NOTE — Assessment & Plan Note (Signed)
His blood pressure is well controlled. No change in meds.  

## 2015-03-12 NOTE — Progress Notes (Signed)
HPI [name] returns today for followup. He has not been seen in our arrhythmia clinic for many years. He has a h/o nocturnal bradycardia and underwent PPM insertion. He does not pace. He is approx. 6 months from ERI. Question now is whether he needs a PPM or not and if not at his age whether we take out the entire system. No other complaints today. He has sleep apnea and uses CPAP.  Allergies  Allergen Reactions  . Crestor [Rosuvastatin] Other (See Comments)    REACTION: Muscle aches  . Lipitor [Atorvastatin] Cough    ? cough  . Zocor [Simvastatin] Other (See Comments)    REACTION: Muscle aches     Current Outpatient Prescriptions  Medication Sig Dispense Refill  . aspirin EC 81 MG tablet Take 81 mg by mouth daily.    . clonazePAM (KLONOPIN) 2 MG tablet Take 1 tablet (2 mg total) by mouth at bedtime. 30 tablet 0  . cyclobenzaprine (FLEXERIL) 10 MG tablet Take 10 mg by mouth 3 (three) times daily as needed for muscle spasms.     Marland Kitchen HYDROcodone-acetaminophen (NORCO/VICODIN) 5-325 MG per tablet Take 1 tablet by mouth every 6 (six) hours as needed for moderate pain.    Marland Kitchen lisinopril-hydrochlorothiazide (PRINZIDE,ZESTORETIC) 20-12.5 MG per tablet Take 1 tablet by mouth daily.  5  . nitroGLYCERIN (NITROSTAT) 0.4 MG SL tablet Place 0.4 mg under the tongue every 5 (five) minutes as needed for chest pain.    Marland Kitchen oxyCODONE-acetaminophen (PERCOCET/ROXICET) 5-325 MG per tablet TAKE 1 TABLET BY MOUTH EVERY 4 TO 6 HOURS AS NEEDED FOR PAIN  0  . propranolol (INDERAL) 20 MG tablet Take 40 mg by mouth 2 (two) times daily.    . QUEtiapine Fumarate (SEROQUEL XR) 150 MG 24 hr tablet Take 1 tablet by mouth as needed (anxiety).     Marland Kitchen sulfaSALAzine (AZULFIDINE) 500 MG tablet Take 1,000 mg by mouth 2 (two) times daily.     No current facility-administered medications for this visit.     Past Medical History  Diagnosis Date  . Anginal pain   . OSA on CPAP    . Bradycardia by electrocardiogram  2006    Medtronic device  . Tremor, essential, treated with propranolol    . HTN (hypertension)    . Dyslipidemia, with intolerance to Crestor and zocor and possible cough with Lipitor    . Back pain, chronic    . Obesity (BMI 30-39.9)   . CAD (coronary artery disease), with CABG LIMA-LAD, VG-ramus intermediate, SVG-OM, SVG-PDA 2002  . CAD (coronary artery disease) of artery bypass graft, with stent to VG-OM-(BMS)      ROS:   All systems reviewed and negative except as noted in the HPI.   Past Surgical History  Procedure Laterality Date  . Back surgery    . Coronary artery bypass graft  04/20/2001    LIMA to LAD;SVG to RAMUS INTERMEDIATE;SVG to  obtuse marginal ; SNG to POSTERIOR DESCENDING  . Doppler echocardiography  05/25/2010    EF =>55%,NORMAL LEFT VENTRICULAR SYSTOLIC DYSFUNCTION  . Doppler echocardiography  05/10/2002  . Nm myocar single w/spect  04/28/2000    EF 52%--INFERIOR SCAR MID APEX WITH MINMAL PERI-INFARCT ISCHEMIA  . Insert / replace / remove pacemaker  dec 6,2006    Medtronic device--vitatron model [id number] serial [id number]  . Cardiac catheterization  01/2013  . Cardiac catheterization  11/28/2012    ef 45%-  . Cardiac catheterization  05/24/2007    revealing  patent grafts  with no significant disease at the graft insertion EF 60%  . Cardiac catheterization  may 2010    patent LIMA to LAD , patent vein graft to PDA with 30% to 40% ostial stenosis,patent vein graft to diag with  60% stenosis in the diag view on the vein graft;patent vein graft second OM with a smooth 60% to 70% LESION PROXIMALLY FOR IVUS which did not meet criteria for PCI  . Coronary angioplasty  01/17/13  . Coronary angioplasty with stent placement  march 2006    ramus beyond the vein graft  Taxus 2.5 x 16 mm stent  . Coronary angioplasty with stent placement   07/2010    SVG to RCA with 3.0 x 13 mm bare -metal stent   . Coronary angioplasty with stent placement  09/01/2010    PCI to SVG to OM  with 4.0 x 32 mm BARE-METAL   . Left heart catheterization with coronary angiogram N/A 11/29/2012    Procedure: LEFT HEART CATHETERIZATION WITH CORONARY ANGIOGRAM;  Surgeon: Leonie Man, MD;  Location: [facility] CATH LAB;  Service: Cardiovascular;  Laterality: N/A;  . Left heart catheterization with coronary/graft angiogram N/A 01/17/2013    Procedure: LEFT HEART CATHETERIZATION WITH Beatrix Fetters;  Surgeon: Troy Sine, MD;  Location: [facility] CATH LAB;  Service: Cardiovascular;  Laterality: N/A;  . Left heart catheterization with coronary/graft angiogram N/A 10/29/2013    Procedure: LEFT HEART CATHETERIZATION WITH Beatrix Fetters;  Surgeon: Lorretta Harp, MD;  Location: [facility] CATH LAB;  Service: Cardiovascular;  Laterality: N/A;  . Cardiac catheterization N/A 02/21/2015    Procedure: Left Heart Cath and Cors/Grafts Angiography;  Surgeon: Peter M Martinique, MD;  Location: [place] CV LAB;  Service: Cardiovascular;  Laterality: N/A;     Family History  Problem Relation Age of Onset  . Heart disease Father   . Hyperlipidemia Father   . Hypertension Father   . Heart murmur Mother      History   Social History  . Marital Status: Married    Spouse Name: N/A  . Number of Children: N/A  . Years of Education: N/A   Occupational History  . Disabled    Social History Main Topics  . Smoking status: Never Smoker   . Smokeless tobacco: Never Used  . Alcohol Use: No  . Drug Use: No  . Sexual Activity: Yes   Other Topics Concern  . Not on file   Social History Narrative   Lives with wife.     BP 130/76 mmHg  Pulse 86  Ht 5\' 10"  (1.778 m)  Wt 267 lb 3.2 oz (121.201 kg)  BMI 38.34 kg/m2  Physical Exam:  Well appearing middle aged man, NAD HEENT: Unremarkable Neck:  No JVD, no thyromegally Back:  No CVA tenderness Lungs:  Clear with no wheezes HEART:  Regular rate rhythm, no murmurs, no rubs, no clicks Abd:  soft, obese, positive bowel sounds, no organomegally, no  rebound, no guarding Ext:  2 plus pulses, no edema, no cyanosis, no clubbing Skin:  No rashes no nodules Neuro:  CN II through XII intact, motor grossly intact   DEVICE  Normal device function.  See PaceArt for details.   Assess/Plan:

## 2015-03-14 ENCOUNTER — Telehealth: Payer: Self-pay | Admitting: *Deleted

## 2015-03-14 NOTE — Telephone Encounter (Signed)
Faxed additional information  Requesting billing information Labs obtain on 02/18/15  ( TSH,PROTHROMBIN TIME,PTT)  Additional CODES given:  l25.810, z79.899, I20.8, R00.1

## 2015-05-22 ENCOUNTER — Telehealth: Payer: Self-pay | Admitting: Cardiology

## 2015-05-22 NOTE — Telephone Encounter (Signed)
Pt needs a letter for DSS,so they will help him with his light bill. Pt need this asap please. They will turn his lights off on Monday. The letter needs to say he needs air and heat for his heart condition.Please fax this letter to [phone].

## 2015-05-23 ENCOUNTER — Encounter: Payer: Self-pay | Admitting: Cardiovascular Disease

## 2015-05-23 NOTE — Telephone Encounter (Signed)
NOTE  DICTATED BY DR [facility] FAXED TO THE GIVEN NUMBER  ATTN KEVIN

## 2015-09-18 ENCOUNTER — Encounter: Payer: Self-pay | Admitting: *Deleted

## 2015-10-12 DIAGNOSIS — I25810 Atherosclerosis of coronary artery bypass graft(s) without angina pectoris: Secondary | ICD-10-CM

## 2015-10-12 HISTORY — DX: Atherosclerosis of coronary artery bypass graft(s) without angina pectoris: I25.810

## 2015-10-17 ENCOUNTER — Telehealth: Payer: Self-pay | Admitting: *Deleted
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
## 2015-10-17 NOTE — Telephone Encounter (Signed)
SPOKE TO PATIENT  Appointment schedule for 10/23/14 with extender. Patient states he is taking the amlodipine 5 mg as prescribed

## 2015-10-24 ENCOUNTER — Encounter: Payer: Self-pay | Admitting: Cardiology

## 2015-10-24 ENCOUNTER — Ambulatory Visit (INDEPENDENT_AMBULATORY_CARE_PROVIDER_SITE_OTHER): Payer: Medicare PPO | Admitting: Cardiology

## 2015-10-24 VITALS — BP 184/110 | HR 79 | Ht 70.0 in | Wt 273.4 lb

## 2015-10-24 DIAGNOSIS — I10 Essential (primary) hypertension: Secondary | ICD-10-CM | POA: Diagnosis not present

## 2015-10-24 DIAGNOSIS — Z950 Presence of cardiac pacemaker: Secondary | ICD-10-CM

## 2015-10-24 DIAGNOSIS — I2510 Atherosclerotic heart disease of native coronary artery without angina pectoris: Secondary | ICD-10-CM | POA: Diagnosis not present

## 2015-10-24 DIAGNOSIS — E785 Hyperlipidemia, unspecified: Secondary | ICD-10-CM

## 2015-10-24 DIAGNOSIS — E669 Obesity, unspecified: Secondary | ICD-10-CM

## 2015-10-24 DIAGNOSIS — Z9989 Dependence on other enabling machines and devices: Secondary | ICD-10-CM

## 2015-10-24 DIAGNOSIS — G4733 Obstructive sleep apnea (adult) (pediatric): Secondary | ICD-10-CM

## 2015-10-24 MED ORDER — AMLODIPINE BESYLATE 10 MG PO TABS: 10.0000 mg | ORAL_TABLET | Freq: Every day | ORAL | Status: DC

## 2015-10-24 NOTE — Assessment & Plan Note (Signed)
Complaint with C-pap 
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
## 2015-10-24 NOTE — Assessment & Plan Note (Signed)
172/112 by me

## 2015-10-24 NOTE — Assessment & Plan Note (Signed)
Pt says his bradycardia is now felt to be secondary to sleep apnea and he may have his pacemaker removed

## 2015-10-24 NOTE — Assessment & Plan Note (Signed)
Most recent cath May 2016- medical Rx

## 2015-10-24 NOTE — Patient Instructions (Signed)
Medication Instructions:  INCREASE Amlodipine to 10 mg - take 1 tablet by mouth daily  >>You may take 2 of the 5 mg tablets until they run out  >>A new prescription has been sent to the pharmacy electronically.  Your physician recommends that check your blood pressures daily for 7 days. If you're blood pressures are consistently 150/90, you may double your Lisinopril-Hydrochlorothiazide.  Follow-Up: Kerin Ransom, PA-C, recommends that you schedule a follow-up appointment in 2 weeks with NP/PA.  If you need a refill on your cardiac medications before your next appointment, please call your pharmacy.

## 2015-10-24 NOTE — Assessment & Plan Note (Signed)
Statin intolerant 

## 2015-10-24 NOTE — Assessment & Plan Note (Signed)
BMI 39 

## 2015-11-06 ENCOUNTER — Encounter: Payer: Medicare PPO | Admitting: Internal Medicine

## 2015-11-07 ENCOUNTER — Ambulatory Visit (INDEPENDENT_AMBULATORY_CARE_PROVIDER_SITE_OTHER): Payer: Medicare PPO | Admitting: Physician Assistant

## 2015-11-07 ENCOUNTER — Encounter: Payer: Self-pay | Admitting: Physician Assistant

## 2015-11-07 VITALS — BP 144/96 | HR 75 | Ht 70.0 in | Wt 273.6 lb

## 2015-11-07 DIAGNOSIS — I2583 Coronary atherosclerosis due to lipid rich plaque: Secondary | ICD-10-CM

## 2015-11-07 DIAGNOSIS — I10 Essential (primary) hypertension: Secondary | ICD-10-CM

## 2015-11-07 DIAGNOSIS — I2510 Atherosclerotic heart disease of native coronary artery without angina pectoris: Secondary | ICD-10-CM | POA: Diagnosis not present

## 2015-11-07 MED ORDER — HYDRALAZINE HCL 25 MG PO TABS: 25.0000 mg | ORAL_TABLET | Freq: Two times a day (BID) | ORAL | Status: DC

## 2015-11-07 MED ORDER — LISINOPRIL 40 MG PO TABS: 40.0000 mg | ORAL_TABLET | Freq: Every day | ORAL | Status: DC

## 2015-11-07 MED ORDER — HYDROCHLOROTHIAZIDE 12.5 MG PO CAPS: 12.5000 mg | ORAL_CAPSULE | Freq: Every day | ORAL | Status: DC

## 2015-11-07 MED ORDER — AMLODIPINE BESYLATE 10 MG PO TABS: 10.0000 mg | ORAL_TABLET | Freq: Every day | ORAL | Status: DC

## 2015-11-07 NOTE — Patient Instructions (Addendum)
Your physician has recommended you make the following change in your medication:   1.) start new prescription sent in today to your pharmacy for hydralazine and HCTZ 12.5 MG as directed on the bottle. Your amlodipine and lisinopril has been sent to the pharmacy as well.  Your physician recommends that you schedule a follow-up appointment in: 2 weeks with Cyril Mourning and 3 months with Dr.Harding.
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
## 2015-11-19 ENCOUNTER — Ambulatory Visit (INDEPENDENT_AMBULATORY_CARE_PROVIDER_SITE_OTHER): Payer: Medicare PPO | Admitting: Pharmacist Clinician (PhC)/ Clinical Pharmacy Specialist

## 2015-11-19 ENCOUNTER — Encounter: Payer: Self-pay | Admitting: Pharmacist Clinician (PhC)/ Clinical Pharmacy Specialist

## 2015-11-19 VITALS — BP 118/82 | HR 72 | Ht 70.0 in | Wt 269.4 lb

## 2015-11-19 DIAGNOSIS — I10 Essential (primary) hypertension: Secondary | ICD-10-CM

## 2015-11-19 NOTE — Assessment & Plan Note (Signed)
Apparently there was some confusion about blood pressure medications and he never picked up the hydralazine.  Today his BP looks good at 118/82, about 3 hours after taking all of his BP medication.  I suggested that he switch amlodipine from mornings to evenings starting tomorrow and continue to watch daily BP readings.  He will check daily in the morning and 2-3 times per week in late afternoon or evening.  I will see him back in a month for follow up.  He is to bring his home cuff at that time so we can check it for accuracy.  Patient was given instructions for proper home BP technique.

## 2015-11-19 NOTE — Progress Notes (Signed)
11/19/2015 CAOLAN JOYNT 1956-07-20 [id number]   HPI:  [name] is a 60 y.o. male patient of Dr Ellyn Hack, with a PMH below who presents today for hypertension clinic evaluation.  Mr. Petzold saw both Lurena Joiner and Gaspar Bidding in the past month and has had several adjustments in his antihypertensive therapy in that time.  After his last visit he was asked to start hydralazine 25 mg twice daily, however he doesn't recall this and does not believe he ever picked up the prescription.  He is still taking 2 of the 20 mg lisinopril tablets, as that was increased earlier in January.  He also states that he has been taking the hctz 12.5 mg for about 2 weeks.    Cardiac Hx: CABG in 2002 at age 56, htn, hpy  Family Hx: father had first CABG by age 70, also CHF;   Social Hx: no tobacco or alcohol, does drink sweet tea most of the day  Diet: eats most meals at home, only adds salt to a few foods (french fries, scrambled eggs)  Exercise: not able to do much, has had 5 back/neck surgeries; recently in [place] BP readings: 123XX123 systolic, only 2 in past 2 weeks with disatolic < 90.  Newer Omron machine  Current antihypertensive medications: lisinopril 40 mg qam, hctz 12.5 mg qam, amlodipine 10 mg qam  Wt Readings from Last 3 Encounters:  11/19/15 269 lb 6.4 oz (122.199 kg)  11/07/15 273 lb 9.6 oz (124.104 kg)  10/24/15 273 lb 6.4 oz (124.013 kg)   BP Readings from Last 3 Encounters:  11/19/15 118/82  11/07/15 144/96  10/24/15 184/110   Pulse Readings from Last 3 Encounters:  11/19/15 72  11/07/15 75  10/24/15 79    Current Outpatient Prescriptions  Medication Sig Dispense Refill  . amLODipine (NORVASC) 10 MG tablet Take 1 tablet (10 mg total) by mouth daily. 90 tablet 3  . aspirin EC 81 MG tablet Take 81 mg by mouth daily. Reported on 10/24/2015    . clonazePAM (KLONOPIN) 2 MG tablet Take 1 tablet (2 mg total) by mouth at bedtime. 30 tablet 0  . cyclobenzaprine (FLEXERIL) 10 MG  tablet Take 10 mg by mouth 3 (three) times daily as needed for muscle spasms.     . hydrochlorothiazide (MICROZIDE) 12.5 MG capsule Take 1 capsule (12.5 mg total) by mouth daily. 90 capsule 3  . HYDROcodone-acetaminophen (NORCO/VICODIN) 5-325 MG per tablet Take 1 tablet by mouth every 6 (six) hours as needed for moderate pain.    Marland Kitchen lisinopril (PRINIVIL,ZESTRIL) 40 MG tablet Take 1 tablet (40 mg total) by mouth daily. 90 tablet 3  . nitroGLYCERIN (NITROSTAT) 0.4 MG SL tablet Place 0.4 mg under the tongue every 5 (five) minutes as needed for chest pain.    Marland Kitchen oxyCODONE-acetaminophen (PERCOCET/ROXICET) 5-325 MG per tablet TAKE 1 TABLET BY MOUTH EVERY 4 TO 6 HOURS AS NEEDED FOR PAIN  0   No current facility-administered medications for this visit.    Allergies  Allergen Reactions  . Crestor [Rosuvastatin] Other (See Comments)    REACTION: Muscle aches  . Lipitor [Atorvastatin] Cough    ? cough  . Zocor [Simvastatin] Other (See Comments)    REACTION: Muscle aches    Past Medical History  Diagnosis Date  . Anginal pain ([place])   . OSA on CPAP    . Bradycardia by electrocardiogram 2006    Medtronic device  . Tremor, essential, treated with propranolol    .  HTN (hypertension)    . Dyslipidemia, with intolerance to Crestor and zocor and possible cough with Lipitor    . Back pain, chronic    . Obesity (BMI 30-39.9)   . CAD (coronary artery disease), with CABG LIMA-LAD, VG-ramus intermediate, SVG-OM, SVG-PDA 2002  . CAD (coronary artery disease) of artery bypass graft, with stent to VG-OM-(BMS)      Blood pressure 118/82, pulse 72, height 5\' 10"  (1.778 m), weight 269 lb 6.4 oz (122.199 kg).    Tommy Medal PharmD CPP [facility]

## 2015-11-19 NOTE — Patient Instructions (Signed)
Your blood pressure today is 118/82  (goal <140/90)  Check your blood pressure at home daily  and keep record of the readings.  Take your BP meds as follows:  Continue lisinopril 40 mg and hydrochlorothiazide 12.5 mg each morning; switch amlodipine 10 mg to evenings  Bring all of your meds, your BP cuff and your record of home blood pressures to your next appointment.  Exercise as you're able, try to walk approximately 30 minutes per day.  Keep salt intake to a minimum, especially watch canned and prepared boxed foods.  Eat more fresh fruits and vegetables and fewer canned items.  Avoid eating in fast food restaurants.    HOW TO TAKE YOUR BLOOD PRESSURE: . Rest 5 minutes before taking your blood pressure. .  Don't smoke or drink caffeinated beverages for at least 30 minutes before. . Take your blood pressure before (not after) you eat. . Sit comfortably with your back supported and both feet on the floor (don't cross your legs). . Elevate your arm to heart level on a table or a desk. . Use the proper sized cuff. It should fit smoothly and snugly around your bare upper arm. There should be enough room to slip a fingertip under the cuff. The bottom edge of the cuff should be 1 inch above the crease of the elbow. . Ideally, take 3 measurements at one sitting and record the average.

## 2015-11-20 ENCOUNTER — Ambulatory Visit: Payer: Self-pay | Admitting: Pharmacist Clinician (PhC)/ Clinical Pharmacy Specialist

## 2015-11-28 ENCOUNTER — Encounter: Payer: Medicare PPO | Admitting: Internal Medicine

## 2015-12-18 ENCOUNTER — Ambulatory Visit: Payer: Self-pay | Admitting: Pharmacist Clinician (PhC)/ Clinical Pharmacy Specialist

## 2016-01-07 ENCOUNTER — Encounter: Payer: Self-pay | Admitting: Internal Medicine

## 2016-01-07 ENCOUNTER — Ambulatory Visit (INDEPENDENT_AMBULATORY_CARE_PROVIDER_SITE_OTHER): Payer: Medicare PPO | Admitting: Internal Medicine

## 2016-01-07 VITALS — BP 134/88 | HR 84 | Ht 70.0 in | Wt 271.6 lb

## 2016-01-07 DIAGNOSIS — Z950 Presence of cardiac pacemaker: Secondary | ICD-10-CM | POA: Diagnosis not present

## 2016-01-07 NOTE — Progress Notes (Signed)
HPI [name] returns today for followup. He has a h/o nocturnal bradycardia and underwent PPM insertion over 11 years ago. He does not pace. I saw him 9 months ago and we reprogrammed the patient's ppm to the ODO mode. In the interim, he has felt well. He denies chest pain or sob. No syncope. No symptomatic bradycardia. He is interested in removal of his device.  Allergies  Allergen Reactions  . Crestor [Rosuvastatin] Other (See Comments)    REACTION: Muscle aches  . Lipitor [Atorvastatin] Cough    ? cough  . Zocor [Simvastatin] Other (See Comments)    REACTION: Muscle aches     Current Outpatient Prescriptions  Medication Sig Dispense Refill  . amLODipine (NORVASC) 10 MG tablet Take 1 tablet (10 mg total) by mouth daily. 90 tablet 3  . clonazePAM (KLONOPIN) 2 MG tablet Take 1 tablet (2 mg total) by mouth at bedtime. 30 tablet 0  . cyclobenzaprine (FLEXERIL) 10 MG tablet Take 10 mg by mouth 3 (three) times daily as needed for muscle spasms.     . hydrochlorothiazide (MICROZIDE) 12.5 MG capsule Take 1 capsule (12.5 mg total) by mouth daily. 90 capsule 3  . HYDROcodone-acetaminophen (NORCO/VICODIN) 5-325 MG per tablet Take 1 tablet by mouth every 6 (six) hours as needed for moderate pain.    Marland Kitchen lisinopril (PRINIVIL,ZESTRIL) 40 MG tablet Take 1 tablet (40 mg total) by mouth daily. 90 tablet 3  . nitroGLYCERIN (NITROSTAT) 0.4 MG SL tablet Place 0.4 mg under the tongue every 5 (five) minutes as needed for chest pain.     No current facility-administered medications for this visit.     Past Medical History  Diagnosis Date  . Anginal pain ([place])   . OSA on CPAP    . Bradycardia by electrocardiogram 2006    Medtronic device  . Tremor, essential, treated with propranolol    . HTN (hypertension)    . Dyslipidemia, with intolerance to Crestor and zocor and possible cough with Lipitor    . Back pain, chronic    . Obesity (BMI 30-39.9)   . CAD (coronary artery disease), with  CABG LIMA-LAD, VG-ramus intermediate, SVG-OM, SVG-PDA 2002  . CAD (coronary artery disease) of artery bypass graft, with stent to VG-OM-(BMS)      ROS:   All systems reviewed and negative except as noted in the HPI.   Past Surgical History  Procedure Laterality Date  . Back surgery    . Coronary artery bypass graft  04/20/2001    LIMA to LAD;SVG to RAMUS INTERMEDIATE;SVG to  obtuse marginal ; SNG to POSTERIOR DESCENDING  . Doppler echocardiography  05/25/2010    EF =>55%,NORMAL LEFT VENTRICULAR SYSTOLIC DYSFUNCTION  . Doppler echocardiography  05/10/2002  . Nm myocar single w/spect  04/28/2000    EF 52%--INFERIOR SCAR MID APEX WITH MINMAL PERI-INFARCT ISCHEMIA  . Insert / replace / remove pacemaker  dec 6,2006    Medtronic device--vitatron model [id number] serial [id number]  . Cardiac catheterization  01/2013  . Cardiac catheterization  11/28/2012    ef 45%-  . Cardiac catheterization  05/24/2007    revealing patent grafts  with no significant disease at the graft insertion EF 60%  . Cardiac catheterization  may 2010    patent LIMA to LAD , patent vein graft to PDA with 30% to 40% ostial stenosis,patent vein graft to diag with  60% stenosis in the diag view on the vein graft;patent vein graft second OM with a smooth  60% to 70% LESION PROXIMALLY FOR IVUS which did not meet criteria for PCI  . Coronary angioplasty  01/17/13  . Coronary angioplasty with stent placement  march 2006    ramus beyond the vein graft  Taxus 2.5 x 16 mm stent  . Coronary angioplasty with stent placement   07/2010    SVG to RCA with 3.0 x 13 mm bare -metal stent   . Coronary angioplasty with stent placement  09/01/2010    PCI to SVG to OM with 4.0 x 32 mm BARE-METAL   . Left heart catheterization with coronary angiogram N/A 11/29/2012    Procedure: LEFT HEART CATHETERIZATION WITH CORONARY ANGIOGRAM;  Surgeon: Leonie Man, MD;  Location: [facility] CATH LAB;  Service: Cardiovascular;  Laterality: N/A;  . Left heart  catheterization with coronary/graft angiogram N/A 01/17/2013    Procedure: LEFT HEART CATHETERIZATION WITH Beatrix Fetters;  Surgeon: Troy Sine, MD;  Location[facility] C[facility] LAB;  Service: Cardiovascular;  Laterality: N/A;  . Left heart catheterization with coronary/graft angiogram N/A 10/29/2013    Procedure: LEFT HEART CATHETERIZATION WITH Beatrix Fetters;  Surgeon: Lorretta Harp, MD;  Location: [facility] CATH LAB;  Service: Cardiovascular;  Laterality: N/A;  . Cardiac catheterization N/A 02/21/2015    Procedure: Left Heart Cath and Cors/Grafts Angiography;  Surgeon: Peter M Martinique, MD;  Location: [facility] CV LAB;  Service: Cardiovascular;  Laterality: N/A;     Family History  Problem Relation Age of Onset  . Heart disease Father   . Hyperlipidemia Father   . Hypertension Father   . Heart murmur Mother      Social History   Social History  . Marital Status: Married    Spouse Name: N/A  . Number of Children: N/A  . Years of Education: N/A   Occupational History  . Disabled    Social History Main Topics  . Smoking status: Never Smoker   . Smokeless tobacco: Never Used  . Alcohol Use: No  . Drug Use: No  . Sexual Activity: Yes   Other Topics Concern  . Not on file   Social History Narrative   Lives with wife.     BP 134/88 mmHg  Pulse 84  Ht 5\' 10"  (1.778 m)  Wt 271 lb 9.6 oz (123.197 kg)  BMI 38.97 kg/m2  Physical Exam:  Well appearing middle aged man, NAD HEENT: Unremarkable Neck:  6 cm JVD, no thyromegally Back:  No CVA tenderness Lungs:  Clear with no wheezes HEART:  Regular rate rhythm, no murmurs, no rubs, no clicks Abd:  soft, obese, positive bowel sounds, no organomegally, no rebound, no guarding Ext:  2 plus pulses, no edema, no cyanosis, no clubbing Skin:  No rashes no nodules Neuro:  CN II through XII intact, motor grossly intact   DEVICE  Normal device function.  See PaceArt for details.   A/P 1. S/p PPM insertion, now with  the device reprogrammed ODO. He is doing well. He has had no symptomatic bradycardia.  2. HTN - his blood pressure was slightly elevated. No change in meds. He is encouraged to lose weight. 3. PM removal - the patient understands the indications for PPM system removal. He would like to proceed. I have carefully outlined the risks/benefits/goals/expectations of the procedure and we will proceed as our schedule allows.  Mikle Bosworth.D.

## 2016-01-07 NOTE — Patient Instructions (Signed)
Medication Instructions:  Your physician recommends that you continue on your current medications as directed. Please refer to the Current Medication list given to you today.   Labwork: none  Testing/Procedures:  We will contact you to set up the procedure.   Follow-Up: To be arranged after procedure.   Any Other Special Instructions Will Be Listed Below (If Applicable).     If you need a refill on your cardiac medications before your next appointment, please call your pharmacy.

## 2016-01-15 ENCOUNTER — Telehealth: Payer: Self-pay | Admitting: *Deleted

## 2016-01-15 NOTE — Telephone Encounter (Signed)
Spoke with patient and he knows to be at the hospital at 8:30am on 4/17, [employer].  NPO after MN. He is going for his pre-op on 4/11.

## 2016-01-15 NOTE — Telephone Encounter (Signed)
-----   Message from Gregery Na, RN sent at 01/09/2016  2:48 PM EDT ----- Claiborne Billings,  I have Mr. Vangorden scheduled on 4/17, starting at 1030. Dr. Darcey Nora is back-up. Case # is [id number].  Thanks, Colletta Maryland ----- Message -----    From: Dionicio Stall, RN    Sent: 01/09/2016   9:11 AM      To: Gregery Na, RN  That should be fine.  Thanks ----- Message -----    From: Gregery Na, RN    Sent: 01/09/2016   9:04 AM      To: Dionicio Stall, RN  Good morning,   I have messaged Ryan about a back-up for 4/17. The only thing is, Dr. Lovena Le would have to follow Dr. Luther Parody EVAR. He would start a little after 1030. Just let me know if that time will work.  Thanks, Colletta [place] ----- Message -----    From: Dionicio Stall, RN    Sent: 01/08/2016   3:57 PM      To: Gregery Na, RN  Needs a lead extraction 4/3 or 4/17 if possible  Thx Claiborne Billings

## 2016-01-20 ENCOUNTER — Encounter (HOSPITAL_COMMUNITY)
Admission: RE | Admit: 2016-01-20 | Discharge: 2016-01-20 | Disposition: A | Discharge status: Home / Self Care | Payer: Medicare PPO | Source: Ambulatory Visit | Attending: Internal Medicine | Admitting: Internal Medicine

## 2016-01-20 ENCOUNTER — Telehealth: Payer: Self-pay | Admitting: Internal Medicine

## 2016-01-20 ENCOUNTER — Encounter (HOSPITAL_COMMUNITY): Payer: Self-pay

## 2016-01-20 DIAGNOSIS — E785 Hyperlipidemia, unspecified: Secondary | ICD-10-CM | POA: Diagnosis not present

## 2016-01-20 DIAGNOSIS — G4733 Obstructive sleep apnea (adult) (pediatric): Secondary | ICD-10-CM | POA: Diagnosis not present

## 2016-01-20 DIAGNOSIS — Z950 Presence of cardiac pacemaker: Secondary | ICD-10-CM | POA: Insufficient documentation

## 2016-01-20 DIAGNOSIS — Z01812 Encounter for preprocedural laboratory examination: Secondary | ICD-10-CM | POA: Diagnosis not present

## 2016-01-20 DIAGNOSIS — I2510 Atherosclerotic heart disease of native coronary artery without angina pectoris: Secondary | ICD-10-CM | POA: Insufficient documentation

## 2016-01-20 DIAGNOSIS — G250 Essential tremor: Secondary | ICD-10-CM | POA: Insufficient documentation

## 2016-01-20 DIAGNOSIS — Z01818 Encounter for other preprocedural examination: Secondary | ICD-10-CM | POA: Diagnosis present

## 2016-01-20 DIAGNOSIS — Z79899 Other long term (current) drug therapy: Secondary | ICD-10-CM | POA: Diagnosis not present

## 2016-01-20 DIAGNOSIS — Z951 Presence of aortocoronary bypass graft: Secondary | ICD-10-CM | POA: Diagnosis not present

## 2016-01-20 DIAGNOSIS — I252 Old myocardial infarction: Secondary | ICD-10-CM | POA: Diagnosis not present

## 2016-01-20 DIAGNOSIS — I10 Essential (primary) hypertension: Secondary | ICD-10-CM | POA: Diagnosis not present

## 2016-01-20 HISTORY — DX: Acute myocardial infarction, unspecified: I21.9

## 2016-01-20 HISTORY — DX: Major depressive disorder, single episode, unspecified: F32.9

## 2016-01-20 HISTORY — DX: Reserved for inherently not codable concepts without codable children: IMO0001

## 2016-01-20 HISTORY — DX: Depression, unspecified: F32.A

## 2016-01-20 LAB — BASIC METABOLIC PANEL
Anion gap: 12 (ref 5–15)
BUN: 14 mg/dL (ref 6–20)
CHLORIDE: 102 mmol/L (ref 101–111)
CO2: 23 mmol/L (ref 22–32)
CREATININE: 1.16 mg/dL (ref 0.61–1.24)
Calcium: 9.5 mg/dL (ref 8.9–10.3)
GFR calc Af Amer: >60 mL/min (ref >60)
GFR calc non Af Amer: >60 mL/min (ref >60)
Glucose, Bld: 114 mg/dL — ABNORMAL HIGH (ref 65–99)
Potassium: 4.1 mmol/L (ref 3.5–5.1)
SODIUM: 137 mmol/L (ref 135–145)

## 2016-01-20 LAB — CBC
HCT: 46.5 % (ref 39.0–52.0)
Hemoglobin: 15.6 g/dL (ref 13.0–17.0)
MCH: 27.6 pg (ref 26.0–34.0)
MCHC: 33.5 g/dL (ref 30.0–36.0)
MCV: 82.2 fL (ref 78.0–100.0)
PLATELETS: 263 10*3/uL (ref 150–400)
RBC: 5.66 MIL/uL (ref 4.22–5.81)
RDW: 14.1 % (ref 11.5–15.5)
WBC: 6.8 10*3/uL (ref 4.0–10.5)

## 2016-01-20 LAB — SURGICAL PCR SCREEN
MRSA, PCR: NEGATIVE
Staphylococcus aureus: NEGATIVE

## 2016-01-20 NOTE — Telephone Encounter (Signed)
New message     Pt is coming in today at noon for pre admission testing for his 01-26-16 procedure.  They do not have orders.  Please call

## 2016-01-20 NOTE — Progress Notes (Signed)
Spoke with Tomi Bamberger from [employer] and informed her of pt's surgery date and time.  She stated "OK, I will put him on my schedule"

## 2016-01-20 NOTE — Progress Notes (Signed)
Pt with long cardiac history to have surgery to remove pacemaker 01/26/16 by Dr Lovena Le.  PPM placed in ~2001 due to "heart stopping", but states this problem was really due to sleep apnea and once he started using his CPAP he no longer had problems and pacemaker is not kicking in ever.  Pacemaker is [employer]tr[employer]. Rep has been called and PPM order form faxed to device clinic.  Cardiologist is Dr [name] last seen 1 month ago  PCP Dr Meryle Ready in [place] [phone]  Has had frequent heart caths, the last on 02/11/15 [facility]  Last stress test 2001 in [facility] Last ECHO in 2011 in [facility]  EKG 10/24/15 in [facility]  Pt denies congestion or cough, although states has difficulty climbing steps due to heart condition.

## 2016-01-20 NOTE — Telephone Encounter (Signed)
[name], Dr Forde Dandy nurse is off today.  [name] from [facility] short stay states that the pt is coming in today at 22 for his pre admission testing for procedure on 4/17. She is requesting that Dr Lovena Le place the pts hospital orders so they will know what they need to do.

## 2016-01-20 NOTE — Pre-Procedure Instructions (Addendum)
    JONOTHAN MELIAN  01/20/2016      [id number] - [place], [place] - [address] [place] [address] Phone: [phone] Fax: [phone]    Your procedure is scheduled on Monday April 17th   Report to [facility] Admitting at 8:30am  Call this number if you have problems the morning of surgery: [phone] If questions prior to morning of surgery call [phone] between 8-4:00   Remember:  Do not eat food or drink liquids after midnight.  Take these medicines the morning of surgery with A SIP OF WATER: Amlodipine (Norvasc), Flexeril (cyclobenzaprine) if needed, pain pill if needed Stop taking all aspirin or aspirin containing products, Nsaids (anti-inflammatories such as Ibuprofen, Motrin, Aleve, Naproxen, Advil), vitamin and herbal supplements   Do not wear jewelry.  Do not wear lotions, powders, or cologne.  You may not wear deodorant.  Do not shave 48 hours prior to surgery.  Men may shave face and neck.   Do not bring valuables to the hospital.  [facility] is not responsible for any belongings or valuables.  Contacts, dentures or bridgework may not be worn into surgery.  Leave your suitcase in the car.  After surgery it may be brought to your room.  Patients discharged the day of surgery will not be allowed to drive home.   Special instructions:  Shower with CHG as instructed the night before and morning of surgery  Please read over the following fact sheets that you were given. Pain Booklet, Coughing and Deep Breathing, MRSA Information and Surgical Site Infection Prevention

## 2016-01-21 NOTE — Progress Notes (Signed)
Anesthesia Chart Review:  Pt is a 60 year old male scheduled for ICD lead removal on 01/26/2016 with Dr. Lovena Le.   Cardiologist is Dr. [name], last office visit 11/07/15 with Tarri Fuller, PA.   PMH includes:  CAD (MI, s/p CABG 2002: LIMA-LAD, SVG-ramus intermediate, SVG-OM, SVG-PDA; stenting of vein grafts to OM and PDA with BMS 2011), bradycardia, pacemaker (medtronic), HTN, OSA (on CPAP), hyperlipidemia, essential tremor.  Never smoker. BMI 39  Medications include: amlodipine, hctz, lisinopril.   Preoperative labs reviewed.    EKG 10/24/15: NSR. Pulmonary disease pattern. LAFB. Minimal voltage criteria for LVH, may be normal variant.   Cardiac cath 02/21/15:   Ost LM to LM lesion, 20% stenosed.  Ost LAD to Prox LAD lesion, 90% stenosed.  Mid LAD lesion, 100% stenosed.  1st Mrg lesion, 100% stenosed.  Ost 2nd Mrg to 2nd Mrg lesion, 99% stenosed.  Prox Cx to Dist Cx lesion, 15% stenosed.  Prox RCA lesion, 80% stenosed.  Mid RCA lesion, 100% stenosed.  Prox Graft lesion, 30% stenosed.  Origin lesion, 40% stenosed.  Ramus-1 lesion, 95% stenosed.  Ramus-2 lesion, 45% stenosed.  Origin lesion, 100% stenosed.  Prox Graft lesion, 100% stenosed.  Dist LAD lesion, 70% stenosed.  1. Severe 3 vessel occlusive CAD 2. Patent LIMA to the LAD 3. Patent SVG to ramus intermediate 4. Occluded SVG to OM 5. Patent SVG to PDA 6. Patent stents in LCx. The first and second OMs are occluded. 7. Normal LV function and normal LVEDP. 8. Recommendations:  No change from prior angiograms. Continue medical therapy and consider alternative causes of his symptoms.   Echo 05/25/10:  - Mildly dilated LA - Mild LVH - Normal LV systolic function - Mild diastolic dysfunction - Post-op paradoxical septal motion.  - No significant valvular abnormalities  If no changes, I anticipate pt can proceed with surgery as scheduled.   Willeen Cass, FNP-BC [facility] Short Stay Surgical  Center/Anesthesiology Phone: [phone] 01/21/2016 9:34 AM

## 2016-01-22 ENCOUNTER — Telehealth: Payer: Self-pay | Admitting: Internal Medicine

## 2016-01-23 MED ORDER — LACTATED RINGERS IV SOLN: INTRAVENOUS | Status: DC

## 2016-01-26 ENCOUNTER — Ambulatory Visit (HOSPITAL_COMMUNITY)
Admission: RE | Admit: 2016-01-26 | Discharge: 2016-01-27 | Disposition: A | Discharge status: Home / Self Care | Payer: Medicare PPO | Source: Ambulatory Visit | Attending: Internal Medicine | Admitting: Internal Medicine

## 2016-01-26 ENCOUNTER — Inpatient Hospital Stay (HOSPITAL_COMMUNITY): Payer: Medicare PPO

## 2016-01-26 ENCOUNTER — Inpatient Hospital Stay (HOSPITAL_COMMUNITY): Payer: Medicare PPO | Admitting: Anesthesiology

## 2016-01-26 ENCOUNTER — Inpatient Hospital Stay (HOSPITAL_COMMUNITY): Payer: Medicare PPO | Admitting: Emergency Medicine

## 2016-01-26 ENCOUNTER — Encounter (HOSPITAL_COMMUNITY): Payer: Self-pay | Admitting: *Deleted

## 2016-01-26 ENCOUNTER — Encounter (HOSPITAL_COMMUNITY)
Admission: RE | Disposition: A | Discharge status: Home / Self Care | Payer: Self-pay | Source: Ambulatory Visit | Attending: Internal Medicine

## 2016-01-26 DIAGNOSIS — Z955 Presence of coronary angioplasty implant and graft: Secondary | ICD-10-CM | POA: Diagnosis not present

## 2016-01-26 DIAGNOSIS — I2510 Atherosclerotic heart disease of native coronary artery without angina pectoris: Secondary | ICD-10-CM | POA: Diagnosis not present

## 2016-01-26 DIAGNOSIS — I10 Essential (primary) hypertension: Secondary | ICD-10-CM | POA: Insufficient documentation

## 2016-01-26 DIAGNOSIS — Z6838 Body mass index (BMI) 38.0-38.9, adult: Secondary | ICD-10-CM | POA: Insufficient documentation

## 2016-01-26 DIAGNOSIS — Z45010 Encounter for checking and testing of cardiac pacemaker pulse generator [battery]: Secondary | ICD-10-CM

## 2016-01-26 DIAGNOSIS — Z45018 Encounter for adjustment and management of other part of cardiac pacemaker: Secondary | ICD-10-CM | POA: Diagnosis present

## 2016-01-26 DIAGNOSIS — G4733 Obstructive sleep apnea (adult) (pediatric): Secondary | ICD-10-CM | POA: Insufficient documentation

## 2016-01-26 DIAGNOSIS — Z950 Presence of cardiac pacemaker: Secondary | ICD-10-CM

## 2016-01-26 DIAGNOSIS — I252 Old myocardial infarction: Secondary | ICD-10-CM | POA: Insufficient documentation

## 2016-01-26 HISTORY — DX: Unspecified osteoarthritis, unspecified site: M19.90

## 2016-01-26 HISTORY — PX: ICD LEAD REMOVAL: SHX5855

## 2016-01-26 HISTORY — DX: Other chronic pain: G89.29

## 2016-01-26 HISTORY — PX: PACEMAKER REMOVAL: SHX5066

## 2016-01-26 HISTORY — DX: Low back pain, unspecified: M54.50

## 2016-01-26 HISTORY — DX: Low back pain: M54.5

## 2016-01-26 HISTORY — DX: Personal history of other medical treatment: Z92.89

## 2016-01-26 LAB — PREPARE RBC (CROSSMATCH)

## 2016-01-26 SURGERY — REMOVAL, ELECTRODE LEAD, ICD
Anesthesia: General | Site: Chest

## 2016-01-26 MED ORDER — LISINOPRIL 40 MG PO TABS
40.0000 mg | ORAL_TABLET | Freq: Every day | ORAL | Status: DC
  Administered 2016-01-27: 40 mg via ORAL
  Filled 2016-01-26: qty 1

## 2016-01-26 MED ORDER — LABETALOL HCL 5 MG/ML IV SOLN
INTRAVENOUS | Status: DC | PRN
  Administered 2016-01-26: 10 mg via INTRAVENOUS

## 2016-01-26 MED ORDER — SODIUM CHLORIDE 0.9 % IV SOLN: 10.0000 mL/h | Freq: Once | INTRAVENOUS | Status: DC

## 2016-01-26 MED ORDER — AMLODIPINE BESYLATE 10 MG PO TABS
10.0000 mg | ORAL_TABLET | Freq: Every day | ORAL | Status: DC
  Administered 2016-01-26 – 2016-01-27 (×2): 10 mg via ORAL
  Filled 2016-01-26: qty 1

## 2016-01-26 MED ORDER — CLONAZEPAM 1 MG PO TABS
2.0000 mg | ORAL_TABLET | Freq: Every day | ORAL | Status: DC
  Administered 2016-01-26: 2 mg via ORAL
  Filled 2016-01-26: qty 2

## 2016-01-26 MED ORDER — FENTANYL CITRATE (PF) 100 MCG/2ML IJ SOLN
INTRAMUSCULAR | Status: DC | PRN
  Administered 2016-01-26: 50 ug via INTRAVENOUS
  Administered 2016-01-26: 150 ug via INTRAVENOUS
  Administered 2016-01-26: 50 ug via INTRAVENOUS
  Administered 2016-01-26: 100 ug via INTRAVENOUS

## 2016-01-26 MED ORDER — FENTANYL CITRATE (PF) 250 MCG/5ML IJ SOLN
INTRAMUSCULAR | Status: AC
Start: 2016-01-26 — End: 2016-01-26
  Filled 2016-01-26: qty 5

## 2016-01-26 MED ORDER — SODIUM CHLORIDE 0.9 % IR SOLN
Status: DC | PRN
  Administered 2016-01-26: 500 mL

## 2016-01-26 MED ORDER — LIDOCAINE HCL (PF) 1 % IJ SOLN
INTRAMUSCULAR | Status: DC | PRN
  Administered 2016-01-26: 30 mL

## 2016-01-26 MED ORDER — NEOSTIGMINE METHYLSULFATE 10 MG/10ML IV SOLN
INTRAVENOUS | Status: DC | PRN
  Administered 2016-01-26: 4 mg via INTRAVENOUS

## 2016-01-26 MED ORDER — HYDROCODONE-ACETAMINOPHEN 5-325 MG PO TABS
1.0000 | ORAL_TABLET | Freq: Four times a day (QID) | ORAL | Status: DC | PRN
  Administered 2016-01-27: 1 via ORAL
  Filled 2016-01-26: qty 1

## 2016-01-26 MED ORDER — ACETAMINOPHEN 325 MG PO TABS: 325.0000 mg | ORAL_TABLET | ORAL | Status: DC | PRN

## 2016-01-26 MED ORDER — DEXTROSE 5 % IV SOLN
3.0000 g | INTRAVENOUS | Status: AC
  Administered 2016-01-26: 3 g via INTRAVENOUS
  Filled 2016-01-26: qty 3000

## 2016-01-26 MED ORDER — LACTATED RINGERS IV SOLN
INTRAVENOUS | Status: DC | PRN
  Administered 2016-01-26 (×2): via INTRAVENOUS

## 2016-01-26 MED ORDER — CYCLOBENZAPRINE HCL 10 MG PO TABS
10.0000 mg | ORAL_TABLET | Freq: Three times a day (TID) | ORAL | Status: DC | PRN
  Administered 2016-01-27: 10 mg via ORAL
  Filled 2016-01-26: qty 1

## 2016-01-26 MED ORDER — FENTANYL CITRATE (PF) 250 MCG/5ML IJ SOLN
INTRAMUSCULAR | Status: AC
  Filled 2016-01-26: qty 5

## 2016-01-26 MED ORDER — HYDROMORPHONE HCL 1 MG/ML IJ SOLN
0.2500 mg | INTRAMUSCULAR | Status: DC | PRN
Start: 2016-01-26 — End: 2016-01-27

## 2016-01-26 MED ORDER — PROPOFOL 10 MG/ML IV BOLUS
INTRAVENOUS | Status: DC | PRN
  Administered 2016-01-26: 50 mg via INTRAVENOUS
  Administered 2016-01-26: 150 mg via INTRAVENOUS

## 2016-01-26 MED ORDER — ONDANSETRON HCL 4 MG/2ML IJ SOLN
INTRAMUSCULAR | Status: AC
  Filled 2016-01-26: qty 2

## 2016-01-26 MED ORDER — ONDANSETRON HCL 4 MG/2ML IJ SOLN: 4.0000 mg | Freq: Once | INTRAMUSCULAR | Status: DC | PRN

## 2016-01-26 MED ORDER — LACTATED RINGERS IV SOLN
INTRAVENOUS | Status: DC
  Administered 2016-01-26 (×2): via INTRAVENOUS

## 2016-01-26 MED ORDER — NEOSTIGMINE METHYLSULFATE 10 MG/10ML IV SOLN
INTRAVENOUS | Status: AC
  Filled 2016-01-26: qty 1

## 2016-01-26 MED ORDER — LIDOCAINE HCL (PF) 1 % IJ SOLN
INTRAMUSCULAR | Status: AC
  Filled 2016-01-26: qty 30

## 2016-01-26 MED ORDER — MIDAZOLAM HCL 2 MG/2ML IJ SOLN
INTRAMUSCULAR | Status: AC
  Filled 2016-01-26: qty 2

## 2016-01-26 MED ORDER — CHLORHEXIDINE GLUCONATE 4 % EX LIQD: 60.0000 mL | Freq: Once | CUTANEOUS | Status: DC

## 2016-01-26 MED ORDER — SUCCINYLCHOLINE CHLORIDE 20 MG/ML IJ SOLN
INTRAMUSCULAR | Status: DC | PRN
  Administered 2016-01-26: 100 mg via INTRAVENOUS

## 2016-01-26 MED ORDER — MIDAZOLAM HCL 5 MG/5ML IJ SOLN
INTRAMUSCULAR | Status: DC | PRN
  Administered 2016-01-26: 2 mg via INTRAVENOUS

## 2016-01-26 MED ORDER — ONDANSETRON HCL 4 MG/2ML IJ SOLN: 4.0000 mg | Freq: Four times a day (QID) | INTRAMUSCULAR | Status: DC | PRN

## 2016-01-26 MED ORDER — MEPERIDINE HCL 25 MG/ML IJ SOLN: 6.2500 mg | INTRAMUSCULAR | Status: DC | PRN

## 2016-01-26 MED ORDER — HYDROCHLOROTHIAZIDE 12.5 MG PO CAPS
12.5000 mg | ORAL_CAPSULE | Freq: Every day | ORAL | Status: DC
  Administered 2016-01-27: 12.5 mg via ORAL
  Filled 2016-01-26: qty 1

## 2016-01-26 MED ORDER — SODIUM CHLORIDE 0.9 % IR SOLN
Freq: Once | Status: DC
  Filled 2016-01-26: qty 2

## 2016-01-26 MED ORDER — NITROGLYCERIN 0.4 MG SL SUBL: 0.4000 mg | SUBLINGUAL_TABLET | SUBLINGUAL | Status: DC | PRN

## 2016-01-26 MED ORDER — SODIUM CHLORIDE 0.9 % IV SOLN: INTRAVENOUS | Status: DC | PRN

## 2016-01-26 MED ORDER — METOPROLOL TARTRATE 1 MG/ML IV SOLN
INTRAVENOUS | Status: DC | PRN
  Administered 2016-01-26: 2 mg via INTRAVENOUS

## 2016-01-26 MED ORDER — PHENYLEPHRINE HCL 10 MG/ML IJ SOLN
10.0000 mg | INTRAVENOUS | Status: DC | PRN
  Administered 2016-01-26: 40 ug/min via INTRAVENOUS

## 2016-01-26 MED ORDER — ROCURONIUM BROMIDE 100 MG/10ML IV SOLN
INTRAVENOUS | Status: DC | PRN
  Administered 2016-01-26: 50 mg via INTRAVENOUS
  Administered 2016-01-26: 30 mg via INTRAVENOUS

## 2016-01-26 MED ORDER — GLYCOPYRROLATE 0.2 MG/ML IJ SOLN
INTRAMUSCULAR | Status: DC | PRN
  Administered 2016-01-26: .6 mg via INTRAVENOUS

## 2016-01-26 MED ORDER — ONDANSETRON HCL 4 MG/2ML IJ SOLN
INTRAMUSCULAR | Status: DC | PRN
  Administered 2016-01-26: 4 mg via INTRAVENOUS

## 2016-01-26 MED ORDER — LABETALOL HCL 5 MG/ML IV SOLN
INTRAVENOUS | Status: AC
  Filled 2016-01-26: qty 4

## 2016-01-26 MED ORDER — GLYCOPYRROLATE 0.2 MG/ML IJ SOLN
INTRAMUSCULAR | Status: AC
  Filled 2016-01-26: qty 2

## 2016-01-26 MED ORDER — GENTAMICIN SULFATE 40 MG/ML IJ SOLN: 80.0000 mg | INTRAMUSCULAR | Status: DC

## 2016-01-26 MED ORDER — SODIUM CHLORIDE 0.9 % IV SOLN
INTRAVENOUS | Status: DC | PRN
  Administered 2016-01-26: 500 mL

## 2016-01-26 SURGICAL SUPPLY — 57 items
BAG BANDED W/RUBBER/TAPE 36X54 (MISCELLANEOUS) IMPLANT
BAG DECANTER FOR FLEXI CONT (MISCELLANEOUS) IMPLANT
BAG EQP BAND 135X91 W/RBR TAPE (MISCELLANEOUS)
BLADE 10 SAFETY STRL DISP (BLADE) ×3 IMPLANT
BLADE CORE FAN STRYKER (BLADE) IMPLANT
BLADE STERNUM SYSTEM 6 (BLADE) IMPLANT
BLADE SURG ROTATE 9660 (MISCELLANEOUS) IMPLANT
BNDG ADH 5X4 AIR PERM ELC (GAUZE/BANDAGES/DRESSINGS) ×1
BNDG COHESIVE 4X5 WHT NS (GAUZE/BANDAGES/DRESSINGS) ×3 IMPLANT
CANISTER SUCTION 2500CC (MISCELLANEOUS) ×3 IMPLANT
CLOSURE WOUND 1/2 X4 (GAUZE/BANDAGES/DRESSINGS) ×1
COIL ONE TIE COMPRESSION (MISCELLANEOUS) ×4 IMPLANT
COVER TABLE BACK 60X90 (DRAPES) ×3 IMPLANT
DRAPE C-ARM 42X72 X-RAY (DRAPES) IMPLANT
DRAPE CARDIOVASCULAR INCISE (DRAPES) ×3
DRAPE INCISE IOBAN 66X45 STRL (DRAPES) ×2 IMPLANT
DRAPE PROXIMA HALF (DRAPES) ×6 IMPLANT
DRAPE SRG 135X102X78XABS (DRAPES) ×1 IMPLANT
DRSG MEPILEX BORDER 4X4 (GAUZE/BANDAGES/DRESSINGS) ×2 IMPLANT
DRSG TEGADERM 4X4.75 (GAUZE/BANDAGES/DRESSINGS) ×2 IMPLANT
ELECT REM PT RETURN 9FT ADLT (ELECTROSURGICAL) ×6
ELECTRODE REM PT RTRN 9FT ADLT (ELECTROSURGICAL) ×2 IMPLANT
GAUZE SPONGE 4X4 12PLY STRL (GAUZE/BANDAGES/DRESSINGS) ×3 IMPLANT
GAUZE SPONGE 4X4 16PLY XRAY LF (GAUZE/BANDAGES/DRESSINGS) IMPLANT
GLOVE BIOGEL PI IND STRL 7.5 (GLOVE) ×1 IMPLANT
GLOVE BIOGEL PI INDICATOR 7.5 (GLOVE) ×2
GLOVE ECLIPSE 8.0 STRL XLNG CF (GLOVE) ×3 IMPLANT
GOWN STRL REUS W/ TWL LRG LVL3 (GOWN DISPOSABLE) ×1 IMPLANT
GOWN STRL REUS W/ TWL XL LVL3 (GOWN DISPOSABLE) ×1 IMPLANT
GOWN STRL REUS W/TWL LRG LVL3 (GOWN DISPOSABLE) ×6
GOWN STRL REUS W/TWL XL LVL3 (GOWN DISPOSABLE) ×3
KIT ROOM TURNOVER OR (KITS) ×3 IMPLANT
NDL PERC 18GX7CM (NEEDLE) IMPLANT
NEEDLE PERC 18GX7CM (NEEDLE) ×3 IMPLANT
NS IRRIG 1000ML POUR BTL (IV SOLUTION) IMPLANT
PAD ARMBOARD 7.5X6 YLW CONV (MISCELLANEOUS) ×6 IMPLANT
PAD ELECT DEFIB RADIOL ZOLL (MISCELLANEOUS) ×3 IMPLANT
SHEATH EVOLUTION RL 11F (SHEATH) ×2 IMPLANT
SHEATH EVOLUTION SHORTE RL 11F (SHEATH) ×2 IMPLANT
SHEATH EVOLUTION SHORTIE RL 9F (SHEATH) ×2 IMPLANT
SHEATH PINNACLE 6F 10CM (SHEATH) ×2 IMPLANT
SPONGE GAUZE 4X4 12PLY STER LF (GAUZE/BANDAGES/DRESSINGS) ×2 IMPLANT
STRIP CLOSURE SKIN 1/2X4 (GAUZE/BANDAGES/DRESSINGS) ×1 IMPLANT
STYLET LIBERATOR LOCKING (MISCELLANEOUS) ×4 IMPLANT
SUT PROLENE 2 0 CT2 30 (SUTURE) IMPLANT
SUT PROLENE 2 0 SH DA (SUTURE) IMPLANT
SUT VIC AB 2-0 CT1 27 (SUTURE) ×3
SUT VIC AB 2-0 CT1 TAPERPNT 27 (SUTURE) IMPLANT
SUT VIC AB 2-0 CT2 18 VCP726D (SUTURE) IMPLANT
SUT VIC AB 3-0 X1 27 (SUTURE) ×2 IMPLANT
TOWEL OR 17X24 6PK STRL BLUE (TOWEL DISPOSABLE) ×6 IMPLANT
TOWEL OR 17X26 10 PK STRL BLUE (TOWEL DISPOSABLE) ×6 IMPLANT
TRAY FOLEY CATH 16FRSI W/METER (SET/KITS/TRAYS/PACK) ×3 IMPLANT
TUBE CONNECTING 12'X1/4 (SUCTIONS)
TUBE CONNECTING 12X1/4 (SUCTIONS) IMPLANT
TUBING IRRIGATION (MISCELLANEOUS) ×2 IMPLANT
YANKAUER SUCT BULB TIP NO VENT (SUCTIONS) IMPLANT

## 2016-01-26 NOTE — Interval H&P Note (Signed)
History and Physical Interval Note:  01/26/2016 1:38 PM  [name]  has presented today for surgery, with the diagnosis of Completion of therapy   The various methods of treatment have been discussed with the patient and family. After consideration of risks, benefits and other options for treatment, the patient has consented to  Permanent PM system removal with Dr. Nils Pyle as a surgical back up .  The patient's history has been reviewed, patient examined, no change in status, stable for surgery.  I have reviewed the patient's chart and labs.  Questions were answered to the patient's satisfaction.     Mikle Bosworth.D.

## 2016-01-26 NOTE — Transfer of Care (Signed)
Immediate Anesthesia Transfer of Care Note  Patient: [name]  Procedure(s) Performed: Procedure(s) with comments: ICD LEAD REMOVAL (N/A) - PVT back up  Patient Location: PACU  Anesthesia Type:General  Level of Consciousness: awake, alert , oriented and patient cooperative  Airway & Oxygen Therapy: Patient Spontanous Breathing and Patient connected to face mask oxygen  Post-op Assessment: Report given to RN, Post -op Vital signs reviewed and stable and Patient moving all extremities  Post vital signs: Reviewed and stable  Last Vitals:  Filed Vitals:   01/26/16 1343  BP: 130/69  Pulse: 81  Temp: 37.2 C  Resp: 20    Complications: No apparent anesthesia complications

## 2016-01-26 NOTE — Anesthesia Preprocedure Evaluation (Addendum)
Anesthesia Evaluation  Patient identified by MRN, date of birth, ID band Patient awake    Reviewed: Allergy & Precautions, NPO status , Patient's Chart, lab work & pertinent test results  Airway Mallampati: II  TM Distance: >3 FB Neck ROM: Full    Dental no notable dental hx.    Pulmonary shortness of breath, sleep apnea ,    Pulmonary exam normal breath sounds clear to auscultation       Cardiovascular hypertension, + angina + CAD, + Past MI and + DOE  Normal cardiovascular exam+ pacemaker  Rhythm:Regular Rate:Normal  Cath 02/2015 1. Severe 3 vessel occlusive CAD 2. Patent LIMA to the LAD 3. Patent SVG to ramus intermediate 4. Occluded SVG to OM 5. Patent SVG to PDA 6. Patent stents in LCx. The first and second OMs are occluded. 7. Normal LV function and normal LVEDP.   Neuro/Psych negative neurological ROS  negative psych ROS   GI/Hepatic negative GI ROS, Neg liver ROS,   Endo/Other  negative endocrine ROS  Renal/GU negative Renal ROS     Musculoskeletal negative musculoskeletal ROS (+)   Abdominal   Peds  Hematology negative hematology ROS (+)   Anesthesia Other Findings   Reproductive/Obstetrics negative OB ROS                           Anesthesia Physical Anesthesia Plan  ASA: III  Anesthesia Plan: General   Post-op Pain Management:    Induction: Intravenous  Airway Management Planned: Oral ETT  Additional Equipment: Arterial line  Intra-op Plan:   Post-operative Plan: Extubation in OR  Informed Consent: I have reviewed the patients History and Physical, chart, labs and discussed the procedure including the risks, benefits and alternatives for the proposed anesthesia with the patient or authorized representative who has indicated his/her understanding and acceptance.   Dental advisory given  Plan Discussed with: CRNA  Anesthesia Plan Comments:          Anesthesia Quick Evaluation

## 2016-01-26 NOTE — H&P (View-Only) (Signed)
HPI Mr. Hornak returns today for followup. He has a h/o nocturnal bradycardia and underwent PPM insertion over 11 years ago. He does not pace. I saw him 9 months ago and we reprogrammed the patient's ppm to the ODO mode. In the interim, he has felt well. He denies chest pain or sob. No syncope. No symptomatic bradycardia. He is interested in removal of his device.  Allergies  Allergen Reactions  . Crestor [Rosuvastatin] Other (See Comments)    REACTION: Muscle aches  . Lipitor [Atorvastatin] Cough    ? cough  . Zocor [Simvastatin] Other (See Comments)    REACTION: Muscle aches     Current Outpatient Prescriptions  Medication Sig Dispense Refill  . amLODipine (NORVASC) 10 MG tablet Take 1 tablet (10 mg total) by mouth daily. 90 tablet 3  . clonazePAM (KLONOPIN) 2 MG tablet Take 1 tablet (2 mg total) by mouth at bedtime. 30 tablet 0  . cyclobenzaprine (FLEXERIL) 10 MG tablet Take 10 mg by mouth 3 (three) times daily as needed for muscle spasms.     . hydrochlorothiazide (MICROZIDE) 12.5 MG capsule Take 1 capsule (12.5 mg total) by mouth daily. 90 capsule 3  . HYDROcodone-acetaminophen (NORCO/VICODIN) 5-325 MG per tablet Take 1 tablet by mouth every 6 (six) hours as needed for moderate pain.    Marland Kitchen lisinopril (PRINIVIL,ZESTRIL) 40 MG tablet Take 1 tablet (40 mg total) by mouth daily. 90 tablet 3  . nitroGLYCERIN (NITROSTAT) 0.4 MG SL tablet Place 0.4 mg under the tongue every 5 (five) minutes as needed for chest pain.     No current facility-administered medications for this visit.     Past Medical History  Diagnosis Date  . Anginal pain ([place])   . OSA on CPAP    . Bradycardia by electrocardiogram 2006    Medtronic device  . Tremor, essential, treated with propranolol    . HTN (hypertension)    . Dyslipidemia, with intolerance to Crestor and zocor and possible cough with Lipitor    . Back pain, chronic    . Obesity (BMI 30-39.9)   . CAD (coronary artery disease), with  CABG LIMA-LAD, VG-ramus intermediate, SVG-OM, SVG-PDA 2002  . CAD (coronary artery disease) of artery bypass graft, with stent to VG-OM-(BMS)      ROS:   All systems reviewed and negative except as noted in the HPI.   Past Surgical History  Procedure Laterality Date  . Back surgery    . Coronary artery bypass graft  04/20/2001    LIMA to LAD;SVG to RAMUS INTERMEDIATE;SVG to  obtuse marginal ; SNG to POSTERIOR DESCENDING  . Doppler echocardiography  05/25/2010    EF =>55%,NORMAL LEFT VENTRICULAR SYSTOLIC DYSFUNCTION  . Doppler echocardiography  05/10/2002  . Nm myocar single w/spect  04/28/2000    EF 52%--INFERIOR SCAR MID APEX WITH MINMAL PERI-INFARCT ISCHEMIA  . Insert / replace / remove pacemaker  dec 6,2006    Medtronic device--vitatron model [id number] serial [id number]  . Cardiac catheterization  01/2013  . Cardiac catheterization  11/28/2012    ef 45%-  . Cardiac catheterization  05/24/2007    revealing patent grafts  with no significant disease at the graft insertion EF 60%  . Cardiac catheterization  may 2010    patent LIMA to LAD , patent vein graft to PDA with 30% to 40% ostial stenosis,patent vein graft to diag with  60% stenosis in the diag view on the vein graft;patent vein graft second OM with a smooth  60% to 70% LESION PROXIMALLY FOR IVUS which did not meet criteria for PCI  . Coronary angioplasty  01/17/13  . Coronary angioplasty with stent placement  march 2006    ramus beyond the vein graft  Taxus 2.5 x 16 mm stent  . Coronary angioplasty with stent placement   07/2010    SVG to RCA with 3.0 x 13 mm bare -metal stent   . Coronary angioplasty with stent placement  09/01/2010    PCI to SVG to OM with 4.0 x 32 mm BARE-METAL   . Left heart catheterization with coronary angiogram N/A 11/29/2012    Procedure: LEFT HEART CATHETERIZATION WITH CORONARY ANGIOGRAM;  Surgeon: Leonie Man, MD;  Location[facility] CATH LAB;  Service: Cardiovascular;  Laterality: N/A;  . Left heart  catheterization with coronary/graft angiogram N/A 01/17/2013    Procedure: LEFT HEART CATHETERIZATION WITH Beatrix Fetters;  Surgeon: Troy Sine, MD;  Location: [facility] CATH LAB;  Service: Cardiovascular;  Laterality: N/A;  . Left heart catheterization with coronary/graft angiogram N/A 10/29/2013    Procedure: LEFT HEART CATHETERIZATION WITH Beatrix Fetters;  Surgeon: Lorretta Harp, MD;  Location: [facility] CATH LAB;  Service: Cardiovascular;  Laterality: N/A;  . Cardiac catheterization N/A 02/21/2015    Procedure: Left Heart Cath and Cors/Grafts Angiography;  Surgeon: Peter M Martinique, MD;  Location: [facility] CV LAB;  Service: Cardiovascular;  Laterality: N/A;     Family History  Problem Relation Age of Onset  . Heart disease Father   . Hyperlipidemia Father   . Hypertension Father   . Heart murmur Mother      Social History   Social History  . Marital Status: Married    Spouse Name: N/A  . Number of Children: N/A  . Years of Education: N/A   Occupational History  . Disabled    Social History Main Topics  . Smoking status: Never Smoker   . Smokeless tobacco: Never Used  . Alcohol Use: No  . Drug Use: No  . Sexual Activity: Yes   Other Topics Concern  . Not on file   Social History Narrative   Lives with wife.     BP 134/88 mmHg  Pulse 84  Ht 5\' 10"  (1.778 m)  Wt 271 lb 9.6 oz (123.197 kg)  BMI 38.97 kg/m2  Physical Exam:  Well appearing middle aged man, NAD HEENT: Unremarkable Neck:  6 cm JVD, no thyromegally Back:  No CVA tenderness Lungs:  Clear with no wheezes HEART:  Regular rate rhythm, no murmurs, no rubs, no clicks Abd:  soft, obese, positive bowel sounds, no organomegally, no rebound, no guarding Ext:  2 plus pulses, no edema, no cyanosis, no clubbing Skin:  No rashes no nodules Neuro:  CN II through XII intact, motor grossly intact   DEVICE  Normal device function.  See PaceArt for details.   A/P 1. S/p PPM insertion, now with  the device reprogrammed ODO. He is doing well. He has had no symptomatic bradycardia.  2. HTN - his blood pressure was slightly elevated. No change in meds. He is encouraged to lose weight. 3. PM removal - the patient understands the indications for PPM system removal. He would like to proceed. I have carefully outlined the risks/benefits/goals/expectations of the procedure and we will proceed as our schedule allows.  Mikle Bosworth.D.

## 2016-01-26 NOTE — Progress Notes (Signed)
Called Tenny Craw PA regarding blood order by CRNA- hgb is 15.6 and minimal blood loss during pt extraction per progress notes. He advised pt does not need to receive blood ordered by CRNA.

## 2016-01-26 NOTE — Anesthesia Postprocedure Evaluation (Signed)
Anesthesia Post Note  Patient: [name]  Procedure(s) Performed: Procedure(s) (LRB): ICD LEAD REMOVAL (N/A)  Patient location during evaluation: PACU Anesthesia Type: General Level of consciousness: sedated and patient cooperative Pain management: pain level controlled Vital Signs Assessment: post-procedure vital signs reviewed and stable Respiratory status: spontaneous breathing Cardiovascular status: stable Anesthetic complications: no    Last Vitals:  Filed Vitals:   01/26/16 1740 01/26/16 1746  BP: 122/90 144/89  Pulse: 73 78  Temp:    Resp: 13 13    Last Pain: There were no vitals filed for this visit.               Nolon Nations

## 2016-01-26 NOTE — Interval H&P Note (Signed)
History and Physical Interval Note:  01/26/2016 1:38 PM  [name]  has presented today for surgery, with the diagnosis of Completion of therapy   The various methods of treatment have been discussed with the patient and family. After consideration of risks, benefits and other options for treatment, the patient has consented to  Procedure(s) with comments: ICD LEAD REMOVAL (N/A) - PVT back up as a surgical intervention .  The patient's history has been reviewed, patient examined, no change in status, stable for surgery.  I have reviewed the patient's chart and labs.  Questions were answered to the patient's satisfaction.     Cristopher Peru

## 2016-01-26 NOTE — CV Procedure (Signed)
EP procedure note  Procedure performed: Extraction of a dual-chamber pacing system  preprocedure diagnosis: status post pacemaker insertion with the device at elective replacement, but with permanent pacemaker no longer indicated  Post procedure diagnoses:Same as preprocedure diagnosis  Description of the procedure: After informed consent was obtained, the patient was taken to the operating room in the fasting state. After the usual preparation and draping, a 6 French sheath was placed in the right femoral vein.30 cc of lidocaine was infiltrated into the left pectoral region. A 6 cm incision was carried out. Electrocautery was utilized to dissect down to the pacemaker pocket. The pacemaker generator was removed with gentle traction. Electrocautery was utilized to free up the dense fibrous adhesions. Stylette's were inserted into the atrial and ventricular lead. The helix was retracted in both leads. The leads were cut. Liberator locking stylette's were inserted into both leads. A 9 French Cook short RLextraction sheath was advanced over the ventricular lead.No progress could be made in freeing up the intravascular binding sites. Next, an Driscoll short RL extraction sheath was inserted into the ventricular lead, and advanced to the innominate vein.Next the long Cook RL 48 [place] extraction sheath, was advanced over the ventricular lead. A combination of traction and countertraction, pressure and counter pressure were used and the ventricular lead was removed in total. There was no hemodynamic consequence. Attention was then turned to the atrial lead. [facility] 11 [place] short RL extraction sheath was advanced to the innominate vein. The short sheath was removed and the [facility] 11 [place] long extraction sheath was advanced over the atrial pacing and using a combination of traction and countertraction, pressure and counter pressure, the atrial lead was removed in total.There were no hemodynamic sequela. At  this point pressure was held.Transesophageal echo monitoring was employed during the case and demonstrated no pericardial effusion. The pacemaker pocket was irrigated with antibiotic irrigation. Electrocautery was utilized to assure hemostasis. The incision was closed with 2 layers of Vicryl suture. The 6 French sheath that had previously been placed in the right femoral vein was removed. Benzoin and Steri-Strips were painted on the skin. The patient was returned to the recovery area in satisfactory condition.   Complications: There were no immediate procedura  Estimated blood loss: Less than 50 cc  Conclusion: Successful extraction of a Medtronic dual-chamber pacing system which had been in place for 11 years. Previously, the patient had not used his pacemaker and was asymptomatic  Cristopher Peru, M.D.

## 2016-01-26 NOTE — Progress Notes (Signed)
Echocardiogram Echocardiogram Transesophageal has been performed.  Joelene Millin 01/26/2016, 3:44 PM

## 2016-01-26 NOTE — Anesthesia Procedure Notes (Signed)
Procedure Name: Intubation Date/Time: 01/26/2016 3:16 PM Performed by: Izora Gala Pre-anesthesia Checklist: Patient identified, Suction available, Emergency Drugs available and Patient being monitored Patient Re-evaluated:Patient Re-evaluated prior to inductionOxygen Delivery Method: Circle system utilized Preoxygenation: Pre-oxygenation with 100% oxygen Intubation Type: IV induction Ventilation: Mask ventilation without difficulty Laryngoscope Size: Miller and 3 Grade View: Grade II Tube type: Oral Tube size: 7.5 mm Number of attempts: 1 Airway Equipment and Method: Stylet and LTA kit utilized Placement Confirmation: ETT inserted through vocal cords under direct vision,  positive ETCO2 and breath sounds checked- equal and bilateral Secured at: 23 cm Tube secured with: Tape Dental Injury: Teeth and Oropharynx as per pre-operative assessment

## 2016-01-27 ENCOUNTER — Encounter (HOSPITAL_COMMUNITY): Payer: Self-pay | Admitting: Internal Medicine

## 2016-01-27 ENCOUNTER — Ambulatory Visit (HOSPITAL_COMMUNITY): Payer: Medicare PPO

## 2016-01-27 DIAGNOSIS — I252 Old myocardial infarction: Secondary | ICD-10-CM | POA: Diagnosis not present

## 2016-01-27 DIAGNOSIS — I2510 Atherosclerotic heart disease of native coronary artery without angina pectoris: Secondary | ICD-10-CM | POA: Diagnosis not present

## 2016-01-27 DIAGNOSIS — I10 Essential (primary) hypertension: Secondary | ICD-10-CM | POA: Diagnosis not present

## 2016-01-27 DIAGNOSIS — Z45010 Encounter for checking and testing of cardiac pacemaker pulse generator [battery]: Secondary | ICD-10-CM

## 2016-01-27 DIAGNOSIS — Z45018 Encounter for adjustment and management of other part of cardiac pacemaker: Secondary | ICD-10-CM | POA: Diagnosis not present

## 2016-01-27 LAB — TYPE AND SCREEN
ABO/RH(D): B NEG
Antibody Screen: NEGATIVE
UNIT DIVISION: 0
UNIT DIVISION: 0

## 2016-01-27 NOTE — Discharge Summary (Signed)
ELECTROPHYSIOLOGY PROCEDURE DISCHARGE SUMMARY    Patient ID: [name],  MRN: [id number], DOB/AGE: 05/25/1956 60 y.o.  Admit date: 01/26/2016 Discharge date: 01/27/2016  Primary Care Physician: Pcp Not In System Primary Cardiologist: Ellyn Hack Electrophysiologist: Lovena Le  Primary Discharge Diagnosis:  Active Problems:   Pacemaker at end of battery life  Secondary Diagnosis: 1.  Hypertension 2.  OSA on CPAP 3.  Morbid obesity 4.  CAD   Allergies  Allergen Reactions  . Crestor [Rosuvastatin] Other (See Comments)    REACTION: Muscle aches  . Lipitor [Atorvastatin] Cough    ? cough  . Zocor [Simvastatin] Other (See Comments)    REACTION: Muscle aches    Procedures This Admission:  1.  Pacemaker system removal on 01/26/16 by Dr Lovena Le. The patient underwent pacemaker system extraction with no early apparent complications. 2.  CXR on 0000000 with no complications noted  Brief HPI:  [name] is a 60 y.o. male with a past medical history significant for symptomatic bradycardia. He underwent pacemaker implantation in 2002.  His device has recently reached ERI and he did not have significant pacing burden.  His device was programmed ODO with no symptoms of bradycardia noted. The patient wished to have his device extracted. Risks, benefits, alternatives were reviewed with the patient who wished to proceed.  Hospital Course:  The patient underwent pacemaker system extraction on 01/26/16 by Dr Lovena Le.  There were no post procedure complications. He was monitored on telemetry overnight which demonstrated SR.  His left chest was without hematoma/ecchymosis. He was examined by Dr Lovena Le and considered stable for discharge to home.   Physical Exam: Filed Vitals:   01/26/16 1902 01/26/16 1932 01/26/16 2002 01/27/16 0621  BP: 136/85 154/115 138/103 120/65  Pulse:    80  Temp:    98.4 F (36.9 C)  TempSrc:    Oral  Resp:    18  Height:      Weight:      SpO2:     94%    GEN- The patient is obese appearing, alert and oriented x 3 today.   HEENT: normocephalic, atraumatic; sclera clear, conjunctiva pink; hearing intact; oropharynx clear; neck supple  Lungs- Clear to ausculation bilaterally, normal work of breathing.  No wheezes, rales, rhonchi Heart- Regular rate and rhythm  GI- soft, non-tender, non-distended, bowel sounds present  Extremities- no clubbing, cyanosis, or edema; DP/PT/radial pulses 2+ bilaterally MS- no significant deformity or atrophy Skin- warm and dry, no rash or lesion Psych- euthymic mood, full affect Neuro- strength and sensation are intact    Labs:   Lab Results  Component Value Date   WBC 6.8 01/20/2016   HGB 15.6 01/20/2016   HCT 46.5 01/20/2016   MCV 82.2 01/20/2016   PLT 263 01/20/2016     Recent Labs Lab 01/20/16 1221  NA 137  K 4.1  CL 102  CO2 23  BUN 14  CREATININE 1.16  CALCIUM 9.5  GLUCOSE 114*     Discharge Medications:  Current Discharge Medication List    CONTINUE these medications which have NOT CHANGED   Details  amLODipine (NORVASC) 10 MG tablet Take 1 tablet (10 mg total) by mouth daily. Qty: 90 tablet, Refills: 3    clonazePAM (KLONOPIN) 2 MG tablet Take 1 tablet (2 mg total) by mouth at bedtime. Qty: 30 tablet, Refills: 0    hydrochlorothiazide (MICROZIDE) 12.5 MG capsule Take 1 capsule (12.5 mg total) by mouth daily. Qty: 90 capsule, Refills: 3  lisinopril (PRINIVIL,ZESTRIL) 40 MG tablet Take 1 tablet (40 mg total) by mouth daily. Qty: 90 tablet, Refills: 3    cyclobenzaprine (FLEXERIL) 10 MG tablet Take 10 mg by mouth 3 (three) times daily as needed for muscle spasms.     HYDROcodone-acetaminophen (NORCO/VICODIN) 5-325 MG per tablet Take 1 tablet by mouth every 6 (six) hours as needed for moderate pain.    nitroGLYCERIN (NITROSTAT) 0.4 MG SL tablet Place 0.4 mg under the tongue every 5 (five) minutes as needed for chest pain.        Disposition: Pt is being  discharged home today in good condition. Discharge Instructions    Diet - low sodium heart healthy    Complete by:  As directed      Discharge instructions    Complete by:  As directed   Keep incision clean and dry for 1 week.  Call the office with redness, swelling, drainage, fever     Increase activity slowly    Complete by:  As directed           Follow-up Information    Follow up with [facility] On 02/05/2016.   Specialty:  Cardiology   Why:  at 12noon   Contact information:   [address], [place] [address] [phone]      Duration of Discharge Encounter: Greater than 30 minutes including physician time.  Signed, Chanetta Marshall, NP 01/27/2016 8:38 AM  EP Attending  Patient seen and examined. Agree with the findings as noted above. He is s/p PM system extraction and is stable for DC home. Usual followup.  Mikle Bosworth.D.

## 2016-01-29 ENCOUNTER — Ambulatory Visit: Payer: Medicare PPO | Admitting: Cardiology

## 2016-02-05 ENCOUNTER — Ambulatory Visit (INDEPENDENT_AMBULATORY_CARE_PROVIDER_SITE_OTHER): Payer: Medicare PPO | Admitting: *Deleted

## 2016-02-05 DIAGNOSIS — Z45018 Encounter for adjustment and management of other part of cardiac pacemaker: Secondary | ICD-10-CM

## 2016-02-05 DIAGNOSIS — IMO0002 Reserved for concepts with insufficient information to code with codable children: Secondary | ICD-10-CM

## 2016-02-05 NOTE — Progress Notes (Signed)
Wound check s/p pacemaker explant 01/26/16 by Dr. Lovena Le. Steristrips removed, incision edges approximated. Wound without redness, swelling or drainage. Pt instructed regarding wound care and plan to follow up with Dr. Ellyn Hack.

## 2016-02-12 ENCOUNTER — Telehealth: Payer: Self-pay | Admitting: Internal Medicine

## 2016-02-12 ENCOUNTER — Ambulatory Visit (INDEPENDENT_AMBULATORY_CARE_PROVIDER_SITE_OTHER): Payer: Medicare PPO | Admitting: *Deleted

## 2016-02-12 DIAGNOSIS — IMO0002 Reserved for concepts with insufficient information to code with codable children: Secondary | ICD-10-CM

## 2016-02-12 DIAGNOSIS — Z45018 Encounter for adjustment and management of other part of cardiac pacemaker: Secondary | ICD-10-CM

## 2016-02-12 NOTE — Progress Notes (Signed)
Pt to device clinic to have pacemaker extraction site evaluated, he feels like it has increased in size and bruising was noted. Scar issue palpated, no drainage pt denies fever,chills. Chanetta Marshall NP evaluate site. Small scab in medial incision removed with tweezers, no stitch visible. Pt instructed to call us if he develops fever, chills drainage or episodes of syncope.

## 2016-02-12 NOTE — Telephone Encounter (Signed)
Patient states that a "knot" has developed at the corner of his incision site. He also states that he believes the site has become larger.   Patient denies any fever or chills.  Patient to follow up in office for a wound recheck today @ 1430.

## 2016-02-12 NOTE — Telephone Encounter (Signed)
New message    1. Has your device fired? no 2. Is you device beeping? no  3. Are you experiencing draining or swelling at device site? yes 4. Are you calling to see if we received your device transmission? no 5. Have you passed out? No, 3 or 4 weeks ago pt had pacemaker and leads removed and its not healing it swollen

## 2016-03-04 ENCOUNTER — Telehealth: Payer: Self-pay | Admitting: Cardiology

## 2016-03-04 MED ORDER — HYDRALAZINE HCL 25 MG PO TABS: 25.0000 mg | ORAL_TABLET | Freq: Three times a day (TID) | ORAL | Status: DC

## 2016-03-04 NOTE — Telephone Encounter (Signed)
Returned call to pt. He said his BP has been running high since yesterday. He also developed a "splitting" headache yesterday. This all happened after having an intense argument with his wife who is in [place] at this time. It was very upsetting to him. However, since then he checked his BP at 0400 this morning when he woke up still having a splitting headache. BP at 0400 189/119, at 0500 189/117, he took his usual BP medicaiton at 0400. While on the phone the pt took his BP and it was 182/120, pulse 81. He says he feels horrible and is very anxious from his fight with his wife.  Pt placed on hold and this taken to Tenny Craw, [place]. He ordered pt to take hydralazine 25mg  (1 tablet) by mouth three times a day.  Returned to pt on the phone and gave him this new order. He verbalized understanding. Pt verbalized understanding to call 911 or go to the emergency room, if he develops any new or worsening symptoms.

## 2016-03-04 NOTE — Telephone Encounter (Signed)
New Message  Pt c/o BP issue:  1. What are your last 5 BP readings?  4 am 189/119 5 am 173/117 8:30a 169/111  2. Are you having any other symptoms (ex. Dizziness, headache, blurred vision, passed out)? Nothing other than the headaches.   3. Comments: Pt states that he has never had headaches like this.  He reports that he had a pounding headache all day yesterday (03/03/2016) and at 4 am it dawned on him to take his BP. He reports that the blood pressure reading was 189/119 and that he took his medication along with a nitro and an Asprin to be sure to get the BP down and to get the headache to cease . Pt reports that he checked it again around 1 hour later and it was 173/117. He states that it came down a little but he couldn't sleep. Pt states that "His head was still splitting"    At 8:30a his BP was 169/111 and he hasn't taken any medications since. Per pt he seen the PA a little over 2 weeks ago, who was aggressive in getting the BP down and it has been great. So he is not sure what is wrong. Please call back to assist.

## 2016-06-28 ENCOUNTER — Other Ambulatory Visit: Payer: Self-pay | Admitting: Cardiology

## 2016-06-29 ENCOUNTER — Encounter (HOSPITAL_COMMUNITY): Payer: Self-pay | Admitting: Emergency Medicine

## 2016-06-29 ENCOUNTER — Encounter (HOSPITAL_COMMUNITY)
Admission: EM | Disposition: A | Discharge status: Home / Self Care | Payer: Self-pay | Source: Home / Self Care | Attending: Cardiology

## 2016-06-29 ENCOUNTER — Emergency Department (HOSPITAL_COMMUNITY): Payer: Medicare PPO

## 2016-06-29 ENCOUNTER — Inpatient Hospital Stay (HOSPITAL_COMMUNITY)
Admission: EM | Admit: 2016-06-29 | Discharge: 2016-06-30 | DRG: 246 | Disposition: A | Discharge status: Home / Self Care | Payer: Medicare PPO | Attending: Cardiology | Admitting: Cardiology

## 2016-06-29 ENCOUNTER — Other Ambulatory Visit: Payer: Self-pay | Admitting: Cardiology

## 2016-06-29 DIAGNOSIS — Z889 Allergy status to unspecified drugs, medicaments and biological substances status: Secondary | ICD-10-CM | POA: Diagnosis not present

## 2016-06-29 DIAGNOSIS — I252 Old myocardial infarction: Secondary | ICD-10-CM

## 2016-06-29 DIAGNOSIS — I25110 Atherosclerotic heart disease of native coronary artery with unstable angina pectoris: Secondary | ICD-10-CM | POA: Diagnosis present

## 2016-06-29 DIAGNOSIS — E785 Hyperlipidemia, unspecified: Secondary | ICD-10-CM | POA: Diagnosis present

## 2016-06-29 DIAGNOSIS — R001 Bradycardia, unspecified: Secondary | ICD-10-CM | POA: Diagnosis present

## 2016-06-29 DIAGNOSIS — Z981 Arthrodesis status: Secondary | ICD-10-CM

## 2016-06-29 DIAGNOSIS — F329 Major depressive disorder, single episode, unspecified: Secondary | ICD-10-CM | POA: Diagnosis present

## 2016-06-29 DIAGNOSIS — Z8249 Family history of ischemic heart disease and other diseases of the circulatory system: Secondary | ICD-10-CM

## 2016-06-29 DIAGNOSIS — G8929 Other chronic pain: Secondary | ICD-10-CM | POA: Diagnosis present

## 2016-06-29 DIAGNOSIS — Z79899 Other long term (current) drug therapy: Secondary | ICD-10-CM

## 2016-06-29 DIAGNOSIS — R0789 Other chest pain: Secondary | ICD-10-CM | POA: Diagnosis present

## 2016-06-29 DIAGNOSIS — I214 Non-ST elevation (NSTEMI) myocardial infarction: Secondary | ICD-10-CM | POA: Diagnosis present

## 2016-06-29 DIAGNOSIS — M545 Low back pain: Secondary | ICD-10-CM | POA: Diagnosis present

## 2016-06-29 DIAGNOSIS — I25700 Atherosclerosis of coronary artery bypass graft(s), unspecified, with unstable angina pectoris: Secondary | ICD-10-CM

## 2016-06-29 DIAGNOSIS — Z955 Presence of coronary angioplasty implant and graft: Secondary | ICD-10-CM

## 2016-06-29 DIAGNOSIS — I10 Essential (primary) hypertension: Secondary | ICD-10-CM | POA: Diagnosis present

## 2016-06-29 DIAGNOSIS — Z888 Allergy status to other drugs, medicaments and biological substances status: Secondary | ICD-10-CM | POA: Diagnosis not present

## 2016-06-29 DIAGNOSIS — G4733 Obstructive sleep apnea (adult) (pediatric): Secondary | ICD-10-CM | POA: Diagnosis present

## 2016-06-29 DIAGNOSIS — I200 Unstable angina: Secondary | ICD-10-CM | POA: Diagnosis present

## 2016-06-29 DIAGNOSIS — Y838 Other surgical procedures as the cause of abnormal reaction of the patient, or of later complication, without mention of misadventure at the time of the procedure: Secondary | ICD-10-CM | POA: Diagnosis present

## 2016-06-29 DIAGNOSIS — E669 Obesity, unspecified: Secondary | ICD-10-CM | POA: Diagnosis present

## 2016-06-29 DIAGNOSIS — T82858A Stenosis of vascular prosthetic devices, implants and grafts, initial encounter: Principal | ICD-10-CM | POA: Diagnosis present

## 2016-06-29 DIAGNOSIS — G250 Essential tremor: Secondary | ICD-10-CM | POA: Diagnosis present

## 2016-06-29 DIAGNOSIS — I2510 Atherosclerotic heart disease of native coronary artery without angina pectoris: Secondary | ICD-10-CM | POA: Diagnosis not present

## 2016-06-29 HISTORY — PX: CARDIAC CATHETERIZATION: SHX172

## 2016-06-29 LAB — CBC
HCT: 45 % (ref 39.0–52.0)
Hemoglobin: 15.3 g/dL (ref 13.0–17.0)
MCH: 28.3 pg (ref 26.0–34.0)
MCHC: 34 g/dL (ref 30.0–36.0)
MCV: 83.3 fL (ref 78.0–100.0)
PLATELETS: 272 10*3/uL (ref 150–400)
RBC: 5.4 MIL/uL (ref 4.22–5.81)
RDW: 14.3 % (ref 11.5–15.5)
WBC: 11.7 10*3/uL — ABNORMAL HIGH (ref 4.0–10.5)

## 2016-06-29 LAB — BASIC METABOLIC PANEL
Anion gap: 9 (ref 5–15)
BUN: 13 mg/dL (ref 6–20)
CHLORIDE: 102 mmol/L (ref 101–111)
CO2: 27 mmol/L (ref 22–32)
CREATININE: 1.23 mg/dL (ref 0.61–1.24)
Calcium: 9 mg/dL (ref 8.9–10.3)
GFR calc Af Amer: >60 mL/min (ref >60)
GFR calc non Af Amer: >60 mL/min (ref >60)
Glucose, Bld: 109 mg/dL — ABNORMAL HIGH (ref 65–99)
Potassium: 3.8 mmol/L (ref 3.5–5.1)
Sodium: 138 mmol/L (ref 135–145)

## 2016-06-29 LAB — TROPONIN I
TROPONIN I: 0.4 ng/mL — AB (ref <0.03)
Troponin I: 9.95 ng/mL (ref <0.03)
Troponin I: 11.95 ng/mL (ref <0.03)

## 2016-06-29 LAB — I-STAT TROPONIN, ED: TROPONIN I, POC: 0.15 ng/mL — AB (ref 0.00–0.08)

## 2016-06-29 LAB — POCT ACTIVATED CLOTTING TIME
ACTIVATED CLOTTING TIME: 307 s
ACTIVATED CLOTTING TIME: 428 s

## 2016-06-29 LAB — PROTIME-INR
INR: 1.18
Prothrombin Time: 15.1 seconds (ref 11.4–15.2)

## 2016-06-29 SURGERY — LEFT HEART CATH AND CORS/GRAFTS ANGIOGRAPHY
Anesthesia: LOCAL

## 2016-06-29 MED ORDER — ONDANSETRON HCL 4 MG/2ML IJ SOLN: 4.0000 mg | Freq: Four times a day (QID) | INTRAMUSCULAR | Status: DC | PRN

## 2016-06-29 MED ORDER — IOPAMIDOL (ISOVUE-370) INJECTION 76%
INTRAVENOUS | Status: DC | PRN
  Administered 2016-06-29: 230 mL via INTRA_ARTERIAL

## 2016-06-29 MED ORDER — BIVALIRUDIN 250 MG IV SOLR
INTRAVENOUS | Status: AC
  Filled 2016-06-29: qty 250

## 2016-06-29 MED ORDER — NITROGLYCERIN 0.4 MG SL SUBL: 0.4000 mg | SUBLINGUAL_TABLET | SUBLINGUAL | Status: DC | PRN

## 2016-06-29 MED ORDER — HEPARIN BOLUS VIA INFUSION
4000.0000 [IU] | Freq: Once | INTRAVENOUS | Status: AC
  Administered 2016-06-29: 4000 [IU] via INTRAVENOUS
  Filled 2016-06-29: qty 4000

## 2016-06-29 MED ORDER — HYDRALAZINE HCL 25 MG PO TABS
25.0000 mg | ORAL_TABLET | Freq: Three times a day (TID) | ORAL | Status: DC
  Filled 2016-06-29: qty 1

## 2016-06-29 MED ORDER — MIDAZOLAM HCL 2 MG/2ML IJ SOLN
INTRAMUSCULAR | Status: AC
  Filled 2016-06-29: qty 2

## 2016-06-29 MED ORDER — BIVALIRUDIN 250 MG IV SOLR
INTRAVENOUS | Status: DC | PRN
  Administered 2016-06-29 (×2): 1.75 mg/kg/h via INTRAVENOUS

## 2016-06-29 MED ORDER — AMLODIPINE BESYLATE 10 MG PO TABS
10.0000 mg | ORAL_TABLET | Freq: Every day | ORAL | Status: DC
  Administered 2016-06-30: 10 mg via ORAL
  Filled 2016-06-29 (×2): qty 1

## 2016-06-29 MED ORDER — SODIUM CHLORIDE 0.9% FLUSH
3.0000 mL | Freq: Two times a day (BID) | INTRAVENOUS | Status: DC
  Administered 2016-06-30: 11:00:00 3 mL via INTRAVENOUS

## 2016-06-29 MED ORDER — ACETAMINOPHEN 325 MG PO TABS
650.0000 mg | ORAL_TABLET | ORAL | Status: DC | PRN
  Administered 2016-06-29 – 2016-06-30 (×2): 650 mg via ORAL
  Filled 2016-06-29 (×2): qty 2

## 2016-06-29 MED ORDER — NITROGLYCERIN 0.4 MG SL SUBL
0.4000 mg | SUBLINGUAL_TABLET | SUBLINGUAL | Status: DC | PRN
  Administered 2016-06-29 (×3): 0.4 mg via SUBLINGUAL
  Filled 2016-06-29: qty 1

## 2016-06-29 MED ORDER — IOPAMIDOL (ISOVUE-370) INJECTION 76%
INTRAVENOUS | Status: AC
  Filled 2016-06-29: qty 125

## 2016-06-29 MED ORDER — HEPARIN (PORCINE) IN NACL 100-0.45 UNIT/ML-% IJ SOLN
1400.0000 [IU]/h | INTRAMUSCULAR | Status: DC
  Administered 2016-06-29: 1400 [IU]/h via INTRAVENOUS
  Filled 2016-06-29 (×2): qty 250

## 2016-06-29 MED ORDER — LIDOCAINE HCL (PF) 1 % IJ SOLN
INTRAMUSCULAR | Status: DC | PRN
  Administered 2016-06-29: 20 mL

## 2016-06-29 MED ORDER — FENTANYL CITRATE (PF) 100 MCG/2ML IJ SOLN
INTRAMUSCULAR | Status: AC
  Filled 2016-06-29: qty 2

## 2016-06-29 MED ORDER — SODIUM CHLORIDE 0.9 % IV BOLUS (SEPSIS)
500.0000 mL | Freq: Once | INTRAVENOUS | Status: AC
  Administered 2016-06-29: 500 mL via INTRAVENOUS

## 2016-06-29 MED ORDER — SODIUM CHLORIDE 0.9% FLUSH: 3.0000 mL | INTRAVENOUS | Status: DC | PRN

## 2016-06-29 MED ORDER — HEPARIN (PORCINE) IN NACL 2-0.9 UNIT/ML-% IJ SOLN
INTRAMUSCULAR | Status: DC | PRN
  Administered 2016-06-29: 1000 mL

## 2016-06-29 MED ORDER — SODIUM CHLORIDE 0.9 % WEIGHT BASED INFUSION: 3.0000 mL/kg/h | INTRAVENOUS | Status: DC

## 2016-06-29 MED ORDER — ASPIRIN 81 MG PO CHEW: 81.0000 mg | CHEWABLE_TABLET | ORAL | Status: DC

## 2016-06-29 MED ORDER — SODIUM CHLORIDE 0.9 % IV SOLN: 250.0000 mL | INTRAVENOUS | Status: DC | PRN

## 2016-06-29 MED ORDER — FENTANYL CITRATE (PF) 100 MCG/2ML IJ SOLN
INTRAMUSCULAR | Status: DC | PRN
  Administered 2016-06-29: 25 ug via INTRAVENOUS
  Administered 2016-06-29 (×3): 50 ug via INTRAVENOUS

## 2016-06-29 MED ORDER — ASPIRIN EC 81 MG PO TBEC
81.0000 mg | DELAYED_RELEASE_TABLET | Freq: Every day | ORAL | Status: DC
  Administered 2016-06-30: 11:00:00 81 mg via ORAL
  Filled 2016-06-29: qty 1

## 2016-06-29 MED ORDER — MORPHINE SULFATE (PF) 4 MG/ML IV SOLN
8.0000 mg | INTRAVENOUS | Status: DC | PRN
  Administered 2016-06-29: 8 mg via INTRAVENOUS
  Filled 2016-06-29: qty 2

## 2016-06-29 MED ORDER — SODIUM CHLORIDE 0.9 % IV SOLN: INTRAVENOUS | Status: DC

## 2016-06-29 MED ORDER — LIDOCAINE HCL (PF) 1 % IJ SOLN
INTRAMUSCULAR | Status: AC
  Filled 2016-06-29: qty 30

## 2016-06-29 MED ORDER — SODIUM CHLORIDE 0.9 % WEIGHT BASED INFUSION: 1.0000 mL/kg/h | INTRAVENOUS | Status: DC

## 2016-06-29 MED ORDER — TICAGRELOR 90 MG PO TABS
ORAL_TABLET | ORAL | Status: DC | PRN
  Administered 2016-06-29: 180 mg via ORAL

## 2016-06-29 MED ORDER — TICAGRELOR 90 MG PO TABS
ORAL_TABLET | ORAL | Status: AC
  Filled 2016-06-29: qty 2

## 2016-06-29 MED ORDER — SODIUM CHLORIDE 0.9 % IV SOLN: INTRAVENOUS | Status: AC

## 2016-06-29 MED ORDER — NITROGLYCERIN IN D5W 200-5 MCG/ML-% IV SOLN
0.0000 ug/min | Freq: Once | INTRAVENOUS | Status: AC
  Administered 2016-06-29: 5 ug/min via INTRAVENOUS
  Filled 2016-06-29: qty 250

## 2016-06-29 MED ORDER — IOPAMIDOL (ISOVUE-370) INJECTION 76%
INTRAVENOUS | Status: AC
  Filled 2016-06-29: qty 50

## 2016-06-29 MED ORDER — SODIUM CHLORIDE 0.9% FLUSH: 3.0000 mL | Freq: Two times a day (BID) | INTRAVENOUS | Status: DC

## 2016-06-29 MED ORDER — ASPIRIN 81 MG PO CHEW
324.0000 mg | CHEWABLE_TABLET | Freq: Once | ORAL | Status: AC
  Administered 2016-06-29: 324 mg via ORAL
  Filled 2016-06-29: qty 4

## 2016-06-29 MED ORDER — BIVALIRUDIN BOLUS VIA INFUSION - CUPID
INTRAVENOUS | Status: DC | PRN
  Administered 2016-06-29: 85.05 mg via INTRAVENOUS

## 2016-06-29 MED ORDER — MIDAZOLAM HCL 2 MG/2ML IJ SOLN
INTRAMUSCULAR | Status: DC | PRN
  Administered 2016-06-29 (×2): 2 mg via INTRAVENOUS

## 2016-06-29 MED ORDER — HEPARIN (PORCINE) IN NACL 2-0.9 UNIT/ML-% IJ SOLN
INTRAMUSCULAR | Status: AC
  Filled 2016-06-29: qty 1000

## 2016-06-29 MED ORDER — TICAGRELOR 90 MG PO TABS
90.0000 mg | ORAL_TABLET | Freq: Two times a day (BID) | ORAL | Status: DC
  Administered 2016-06-30 (×2): 90 mg via ORAL
  Filled 2016-06-29 (×2): qty 1

## 2016-06-29 MED ORDER — CLONAZEPAM 0.5 MG PO TABS
2.0000 mg | ORAL_TABLET | Freq: Every day | ORAL | Status: DC
  Administered 2016-06-29: 2 mg via ORAL
  Filled 2016-06-29: qty 4

## 2016-06-29 MED ORDER — ASPIRIN 81 MG PO CHEW: 81.0000 mg | CHEWABLE_TABLET | Freq: Every day | ORAL | Status: DC

## 2016-06-29 MED ORDER — LISINOPRIL 40 MG PO TABS
40.0000 mg | ORAL_TABLET | Freq: Every day | ORAL | Status: DC
  Administered 2016-06-29 – 2016-06-30 (×2): 40 mg via ORAL
  Filled 2016-06-29 (×2): qty 1

## 2016-06-29 MED ORDER — BUPROPION HCL ER (SR) 150 MG PO TB12
150.0000 mg | ORAL_TABLET | Freq: Two times a day (BID) | ORAL | Status: DC
  Administered 2016-06-29 – 2016-06-30 (×2): 150 mg via ORAL
  Filled 2016-06-29 (×3): qty 1

## 2016-06-29 MED ORDER — IOPAMIDOL (ISOVUE-370) INJECTION 76%
INTRAVENOUS | Status: AC
  Filled 2016-06-29: qty 100

## 2016-06-29 SURGICAL SUPPLY — 24 items
BALLN EMERGE MR 2.5X20 (BALLOONS) ×2
BALLN ~~LOC~~ EMERGE MR 4.0X12 (BALLOONS) ×2
BALLOON EMERGE MR 2.5X20 (BALLOONS) IMPLANT
BALLOON ~~LOC~~ EMERGE MR 4.0X12 (BALLOONS) IMPLANT
CATH INFINITI 5 FR AR1 MOD (CATHETERS) ×1 IMPLANT
CATH INFINITI 5 FR IM (CATHETERS) ×1 IMPLANT
CATH INFINITI 5FR AL1 (CATHETERS) ×1 IMPLANT
CATH INFINITI 5FR JL4 (CATHETERS) ×1 IMPLANT
CATH INFINITI 5FR MPB2 (CATHETERS) ×1 IMPLANT
CATH INFINITI JR4 5F (CATHETERS) ×1 IMPLANT
GUIDE CATH RUNWAY 6FR AR1 (CATHETERS) ×1 IMPLANT
HOVERMATT SINGLE USE (MISCELLANEOUS) ×1 IMPLANT
KIT ENCORE 26 ADVANTAGE (KITS) ×1 IMPLANT
KIT HEART LEFT (KITS) ×2 IMPLANT
PACK CARDIAC CATHETERIZATION (CUSTOM PROCEDURE TRAY) ×2 IMPLANT
SHEATH PINNACLE 5F 10CM (SHEATH) ×1 IMPLANT
SHEATH PINNACLE 6F 10CM (SHEATH) ×1 IMPLANT
STENT PROMUS PREM MR 3.5X24 (Permanent Stent) ×1 IMPLANT
STENT PROMUS PREM MR 3.5X28 (Permanent Stent) ×1 IMPLANT
TRANSDUCER W/STOPCOCK (MISCELLANEOUS) ×2 IMPLANT
TUBING CIL FLEX 10 FLL-RA (TUBING) ×2 IMPLANT
WIRE EMERALD 3MM-J .035X150CM (WIRE) ×1 IMPLANT
WIRE EMERALD 3MM-J .035X260CM (WIRE) ×1 IMPLANT
WIRE HI TORQ BMW 190CM (WIRE) ×1 IMPLANT

## 2016-06-29 NOTE — ED Notes (Signed)
Troponin results called to [facility]

## 2016-06-29 NOTE — ED Notes (Signed)
CRITICAL VALUE ALERT  Critical value received:  Troponin 0.40  Date of notification:  06/29/2016  Time of notification:  0833  Critical value read back: yes  Nurse who received alert:  Earleen Newport RN   MD notified: MD Vanita Panda

## 2016-06-29 NOTE — Progress Notes (Signed)
Notified by central monitoring that client had a run of vtach. Notified harding md. Spoke with hailee rn. Also notified md of decrease in pts bp. Orders received to hold norvasc and hydralazine and only give lisinopril at this time.

## 2016-06-29 NOTE — ED Triage Notes (Signed)
Pt reports episode of chest pressure and nausea yesterday, resolved then returned. Hx MIx6 with stent placement. Returned with this morning at 0200, took 3 SL nitro with relief. Described as a "knot" and pressure in chest radiating to shoulders.

## 2016-06-29 NOTE — ED Notes (Signed)
Alarms on monitor ringing out at nurses station showing patient HR 32 with prolonged PR interval, pt reports "pain I've never felt before with any of my heart attacks," rated 6/10. Cardiology notified and at bedside to assess patient. Alarm strip printed and placed at bedside. Pt remains a/o x4, +3 bounding pulses in all extremities. Verbal order given for nitro drip. Will continue to closely monitor.

## 2016-06-29 NOTE — Telephone Encounter (Signed)
Rx request sent to pharmacy.  

## 2016-06-29 NOTE — Brief Op Note (Signed)
   06/29/2016  3:03 PM  PATIENT:  [name]  60 y.o. male  PRE-OPERATIVE DIAGNOSIS:  NSTEMI - KNOWN CAD-CABG, PCI  POST-OPERATIVE DIAGNOSIS:  TANDEM 80%ostial ISR & 95% mSVG-rPDA --> PCI with 2 overlapping DES  PROCEDURE:  Procedure(s): Left Heart Cath and Cors/Grafts Angiography (N/A) Coronary Stent Intervention (N/A)  SURGEON:  Surgeon(s) and Role:    * Leonie Man, MD - Primary  ANESTHESIA:   local and IV sedation ; 4 mg Versed, 125 mg fentanyl  EBL:  Total I/O In: 500 [IV Piggyback:500] Out: 550 [Urine:550]  LEFT HEART CATH: Right femoral artery access with 5 French sheath fluoroscopic technique. LCA angiography with J L4, RCA with JR4, SVG-OM and SVG-RI with MPA 1, SVG-RCA with an AR-1, LIMA-LAD with IMA catheter   Initial angiography revealed severe lesions in the SVG-RPDA. Preparation made for PCI.  PCI to SVG RPDA - 6 [place] AR-1 guide  5 [place] sheath exchanged for 6 [place]  Angiomax bolus and drip, Brilinta 180 mg by mouth  mid SVG 95% reduced to 0% (TIMI 1 flow to TIMI 3 flow) Promus Premier DES 3.5 mm x 28 mm (4.0 mm) --   ostial SVG-RPDA 80% reduced to 0% - overlapping Promus. DES 3.5 mm x 24 mm (4.0 mm)   Contrast: 230 mL Sedation time 90 minutes  SHEATH: 6 French sheath sutured in place.  Will be removed in post procedure unit with manual pressure.  DICTATION: .Note written in [place]: Admit for overnight. Anticipate discharge tomorrow. DAPT for minimum of 3 months and then Brilinta for a year.  PATIENT DISPOSITION:  PACU - hemodynamically stable.   Delay start of Pharmacological VTE agent (>24hrs) due to surgical blood loss or risk of bleeding: not applicable

## 2016-06-29 NOTE — Progress Notes (Signed)
ANTICOAGULATION CONSULT NOTE - Initial Consult  Pharmacy Consult for heparin Indication: chest pain/ACS  Allergies  Allergen Reactions  . Crestor [Rosuvastatin] Other (See Comments)    REACTION: Muscle aches  . Lipitor [Atorvastatin] Cough    ? cough  . Zocor [Simvastatin] Other (See Comments)    REACTION: Muscle aches    Patient Measurements: Height: 5\' 10"  (177.8 cm) Weight: 250 lb (113.4 kg) IBW/kg (Calculated) : 73 Heparin Dosing Weight: 98kg  Vital Signs: Temp: 98.7 F (37.1 C) (09/19 0718) Temp Source: Oral (09/19 0718) BP: 119/71 (09/19 0758) Pulse Rate: 64 (09/19 0758)  Labs:  Recent Labs  06/29/16 0622 06/29/16 0733  HGB 15.3  --   HCT 45.0  --   PLT 272  --   CREATININE 1.23  --   TROPONINI  --  0.40*    Estimated Creatinine Clearance: 80.6 mL/min (by C-G formula based on SCr of 1.23 mg/dL).   Medical History: Past Medical History:  Diagnosis Date  . Anginal pain ([place])   . Arthritis    "left shoulder; back" (01/26/2016)  . Bradycardia by electrocardiogram 2006   Medtronic device  . CAD (coronary artery disease) of artery bypass graft, with stent to VG-OM-(BMS)    . CAD (coronary artery disease), with CABG LIMA-LAD, VG-ramus intermediate, SVG-OM, SVG-PDA 2002  . Chronic lower back pain    . Depression   . Dyslipidemia, with intolerance to Crestor and zocor and possible cough with Lipitor    . History of blood transfusion 04/2001   "when I had bypass OR"  . HTN (hypertension)    . Myocardial infarction ([place])    hx 6 heart attacks last one being in 2015 april? (01/26/2016)  . Obesity (BMI 30-39.9)   . OSA on CPAP    . Shortness of breath dyspnea    unable to climb a set of stairs  . Tremor, essential, treated with propranolol      Medications:  Infusions:  . heparin      Assessment: 32 yom presented to the ED with chest pain. Found to have an elevated troponin and now starting IV heparin. Baseline H/H and platelets are WNL. He is not on  anticoagulation PTA.    Goal of Therapy:  Heparin level 0.3-0.7 units/ml Monitor platelets by anticoagulation protocol: Yes   Plan:  - Heparin bolus 4000 units IV x 1 - Heparin gtt 1400 units/hr - Check a 6 hr heparin level - Daily heparin level and CBC  Maryelizabeth Eberle, Rande Lawman 06/29/2016,8:52 AM

## 2016-06-29 NOTE — Progress Notes (Signed)
CRITICAL VALUE ALERT  Critical value received: TROPONIN =9.95  Date of notification:  06/29/16  Time of notification:  2440  Critical value read back:Yes.    Nurse who received alert:  Chaney Born RN  MD notified (1st page):  BRITTANY SIMMONS  PA  Time of first page:  [phone]  MD notified (2nd page):  Time of second page:[phone]  Responding MD: Ellen Henri PA  Time MD responded:  [phone]

## 2016-06-29 NOTE — ED Notes (Signed)
MD Vanita Panda made aware of patient resistance to nitroglycerin x3 and need of morphine prn order to relieve chest pain.

## 2016-06-29 NOTE — Interval H&P Note (Signed)
History and Physical Interval Note:  06/29/2016 12:37 PM  [name]  has presented today for surgery, with the diagnosis of CAD with ACS/NSTEMI  The various methods of treatment have been discussed with the patient and family. After consideration of risks, benefits and other options for treatment, the patient has consented to  Procedure(s): Left Heart Cath and Cors/Grafts Angiography (N/A) & POSSIBLE PERCUTANEOUS CORONARY INTERVENTION as a surgical intervention .  The patient's history has been reviewed, patient examined, no change in status, stable for surgery.  I have reviewed the patient's chart and labs.  Questions were answered to the patient's satisfaction.     Cath Lab Visit (complete for each Cath Lab visit)  Clinical Evaluation Leading to the Procedure:   ACS: Yes.    Non-ACS:    Anginal Classification: CCS IV  Anti-ischemic medical therapy: Minimal Therapy (1 class of medications)  Non-Invasive Test Results: No non-invasive testing performed  Prior CABG: Previous CABG   [name]
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
## 2016-06-29 NOTE — Care Management Note (Signed)
Case Management Note  Patient Details  Name: MAREK NGHIEM MRN: [id number] Date of Birth: 1955/10/29  Subjective/Objective:   S/p coronary stent intervention, NCM awaiting benefit check for brilinta.                   Action/Plan:   Expected Discharge Date:                  Expected Discharge Plan:  Home/Self Care  In-House Referral:     Discharge planning Services  CM Consult  Post Acute Care Choice:    Choice offered to:     DME Arranged:    DME Agency:     HH Arranged:    HH Agency:     Status of Service:  In process, will continue to follow  If discussed at Long Length of Stay Meetings, dates discussed:    Additional Comments:  Zenon Mayo, RN 06/29/2016, 3:16 PM

## 2016-06-29 NOTE — Progress Notes (Signed)
Rt femoral sheath removed at 1640. Pressure held manually for 20 mins.  Pt tolerated sheath removal well.  Post pull rt femoral pulse 2+. Post pull instructions given and pt verbalized understanding.

## 2016-06-29 NOTE — Progress Notes (Signed)
S/W EULA @ [employer] # [phone]   BRILINTA  90 MG BID (30 ) 60 TAB   COVER- YES  CO-PAY- $ 8.25  Q/L  TIER- 3 DRUG  PRIOR APPROVAL- NO  PHARMACY - [facility]

## 2016-06-29 NOTE — Progress Notes (Signed)
Date of notification:06/29/2016  Time of notification: 23:00  Critical value read back: Troponin 11.95  Nurse who received alert: Vedia Coffer RN  MD notified (1st page): Dr. Koleen Nimrod  Time of first page: 2300  MD notified (2nd page):  Time of second page:  Responding MD: Dr. Koleen Nimrod   Time MD responded: 2330

## 2016-06-29 NOTE — Progress Notes (Addendum)
RN: Spoke with brittany regarding clients elevated troponin. Orders for ekg received and completed. And to monitor pt. Client is asymptomatic at this time.  -- Troponin Elevated 2/2 presenting NSTEMI, Not Post-procedure  [name], MD
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
## 2016-06-30 ENCOUNTER — Encounter (HOSPITAL_COMMUNITY): Payer: Self-pay | Admitting: Cardiology

## 2016-06-30 ENCOUNTER — Other Ambulatory Visit (HOSPITAL_COMMUNITY): Payer: Medicare PPO

## 2016-06-30 ENCOUNTER — Inpatient Hospital Stay (HOSPITAL_COMMUNITY): Payer: Medicare PPO

## 2016-06-30 ENCOUNTER — Telehealth: Payer: Self-pay | Admitting: Cardiology

## 2016-06-30 DIAGNOSIS — Z889 Allergy status to unspecified drugs, medicaments and biological substances status: Secondary | ICD-10-CM

## 2016-06-30 DIAGNOSIS — I2510 Atherosclerotic heart disease of native coronary artery without angina pectoris: Secondary | ICD-10-CM

## 2016-06-30 DIAGNOSIS — E785 Hyperlipidemia, unspecified: Secondary | ICD-10-CM

## 2016-06-30 HISTORY — PX: TRANSTHORACIC ECHOCARDIOGRAM: SHX275

## 2016-06-30 LAB — BASIC METABOLIC PANEL
Anion gap: 7 (ref 5–15)
BUN: 12 mg/dL (ref 6–20)
CALCIUM: 8.7 mg/dL — AB (ref 8.9–10.3)
CO2: 22 mmol/L (ref 22–32)
CREATININE: 1.09 mg/dL (ref 0.61–1.24)
Chloride: 105 mmol/L (ref 101–111)
GFR calc non Af Amer: >60 mL/min (ref >60)
GLUCOSE: 109 mg/dL — AB (ref 65–99)
Potassium: 3.7 mmol/L (ref 3.5–5.1)
Sodium: 134 mmol/L — ABNORMAL LOW (ref 135–145)

## 2016-06-30 LAB — TROPONIN I: Troponin I: 10.8 ng/mL (ref <0.03)

## 2016-06-30 LAB — CBC
HEMATOCRIT: 43.2 % (ref 39.0–52.0)
Hemoglobin: 14.6 g/dL (ref 13.0–17.0)
MCH: 28.1 pg (ref 26.0–34.0)
MCHC: 33.8 g/dL (ref 30.0–36.0)
MCV: 83.1 fL (ref 78.0–100.0)
PLATELETS: 253 10*3/uL (ref 150–400)
RBC: 5.2 MIL/uL (ref 4.22–5.81)
RDW: 14.5 % (ref 11.5–15.5)
WBC: 11.7 10*3/uL — AB (ref 4.0–10.5)

## 2016-06-30 LAB — LIPID PANEL
Cholesterol: 214 mg/dL — ABNORMAL HIGH (ref 0–200)
HDL: 28 mg/dL — ABNORMAL LOW (ref >40)
LDL CALC: 137 mg/dL — AB (ref 0–99)
Total CHOL/HDL Ratio: 7.6 RATIO
Triglycerides: 246 mg/dL — ABNORMAL HIGH (ref <150)
VLDL: 49 mg/dL — ABNORMAL HIGH (ref 0–40)

## 2016-06-30 LAB — ECHOCARDIOGRAM COMPLETE
Height: 70 in
Weight: 4021.19 oz

## 2016-06-30 MED ORDER — TICAGRELOR 90 MG PO TABS: 90.0000 mg | ORAL_TABLET | Freq: Two times a day (BID) | ORAL | 10 refills | Status: DC

## 2016-06-30 MED ORDER — PROPRANOLOL HCL 10 MG PO TABS
5.0000 mg | ORAL_TABLET | Freq: Three times a day (TID) | ORAL | Status: DC
  Administered 2016-06-30: 5 mg via ORAL
  Filled 2016-06-30 (×3): qty 0.5

## 2016-06-30 MED ORDER — ASPIRIN 81 MG PO CHEW: 81.0000 mg | CHEWABLE_TABLET | ORAL | Status: DC

## 2016-06-30 MED ORDER — ANGIOPLASTY BOOK
Freq: Once | Status: DC
  Filled 2016-06-30: qty 1

## 2016-06-30 MED ORDER — HEART ATTACK BOUNCE BACK BOOK- IN SPANISH
Freq: Once | Status: DC
  Filled 2016-06-30: qty 1

## 2016-06-30 MED ORDER — TICAGRELOR 90 MG PO TABS: 90.0000 mg | ORAL_TABLET | Freq: Two times a day (BID) | ORAL | 0 refills | Status: DC

## 2016-06-30 MED ORDER — PERFLUTREN LIPID MICROSPHERE
1.0000 mL | INTRAVENOUS | Status: AC | PRN
  Administered 2016-06-30: 13:00:00 1 mL via INTRAVENOUS
  Filled 2016-06-30: qty 10

## 2016-06-30 MED ORDER — PROPRANOLOL HCL 10 MG PO TABS: 5.0000 mg | ORAL_TABLET | Freq: Three times a day (TID) | ORAL | 6 refills | Status: DC

## 2016-06-30 MED ORDER — PRAVASTATIN SODIUM 20 MG PO TABS: 20.0000 mg | ORAL_TABLET | Freq: Every day | ORAL | 3 refills | Status: DC

## 2016-06-30 MED ORDER — PERFLUTREN LIPID MICROSPHERE
INTRAVENOUS | Status: AC
  Filled 2016-06-30: qty 10

## 2016-06-30 NOTE — Progress Notes (Signed)
  Echocardiogram 2D Echocardiogram with Definity has been performed.  [name] M 06/30/2016, 1:32 PM

## 2016-06-30 NOTE — Telephone Encounter (Signed)
07/07/2016    Time: 9:00 AM    Visit Type: OFFICE VISIT 30 [368]    Provider: Lonn Georgia, PA-C          Notes: 9-20 lendsey PA/ TOC/ks  Made On: 06/30/2016 8:50 AM

## 2016-06-30 NOTE — Progress Notes (Signed)
CARDIAC REHAB PHASE I   PRE:  Rate/Rhythm: 72 SR  BP:  Supine: 129/72  Sitting:   Standing:    SaO2:   MODE:  Ambulation: 800 ft   POST:  Rate/Rhythm: 94 SR  BP:  Supine: 141/73  Sitting:   Standing:    SaO2:  0920-1015 Pt walked 800 ft on RA with steady gait. Tolerated well. No CP. MI education completed with pt and wife who voiced understanding. Stressed importance of brilinta with stent. Has brilinta card. Reviewed NTG use, risk factors, modified ex ed and heart healthy diet. Discussed CRP 2 and will refer to [facility]. Pt has back issues so gave modified ex.   Graylon Good, RN BSN  06/30/2016 10:10 AM

## 2016-06-30 NOTE — Progress Notes (Signed)
Patient Name: [name] Date of Encounter: 06/30/2016  Hospital Problem List     Active Problems:   NSTEMI (non-ST elevated myocardial infarction), 01/17/13 PCI LCX AV Groove with Xience DES. and angiosculpt. unable to cross occluded OM.  EF 40 %   Unstable angina (HCC)    Subjective   Feeling much better this morning.   Inpatient Medications    . amLODipine  10 mg Oral Daily  . angioplasty book   Does not apply Once  . aspirin  81 mg Oral Pre-Cath  . aspirin EC  81 mg Oral Daily  . buPROPion  150 mg Oral BID  . clonazePAM  2 mg Oral QHS  . lisinopril  40 mg Oral Daily  . sodium chloride flush  3 mL Intravenous Q12H  . ticagrelor  90 mg Oral BID    Vital Signs    Vitals:   06/29/16 2000 06/29/16 2245 06/30/16 0115 06/30/16 0643  BP: 136/74 (!) 131/59 (!) 106/57   Pulse: 72 76 81   Resp: (!) 24 20 (!) 21   Temp:   97.7 F (36.5 C)   TempSrc:   Oral   SpO2: 95% 96% 97%   Weight:    251 lb 5.2 oz (114 kg)  Height:        Intake/Output Summary (Last 24 hours) at 06/30/16 0816 Last data filed at 06/30/16 0115  Gross per 24 hour  Intake              120 ml  Output             1250 ml  Net            -1130 ml   Filed Weights   06/29/16 0613 06/30/16 0643  Weight: 250 lb (113.4 kg) 251 lb 5.2 oz (114 kg)    Physical Exam    General: Pleasant, NAD. Neuro: Alert and oriented X 3. Moves all extremities spontaneously. Psych: Normal affect. HEENT:  Normal  Neck: Supple without bruits or JVD. Lungs:  Resp regular and unlabored, CTA. Heart: RRR no s3, s4, or murmurs. Abdomen: Soft, non-tender, non-distended, BS + x 4.  Extremities: No clubbing, cyanosis or edema. DP/PT/Radials 2+ and equal bilaterally. Right femoral site stable without bruising or hematoma.   Labs    CBC  Recent Labs  06/29/16 0622 06/30/16 0308  WBC 11.7* 11.7*  HGB 15.3 14.6  HCT 45.0 43.2  MCV 83.3 83.1  PLT 272 381   Basic Metabolic Panel  Recent Labs  06/29/16 0622  06/30/16 0308  NA 138 134*  K 3.8 3.7  CL 102 105  CO2 27 22  GLUCOSE 109* 109*  BUN 13 12  CREATININE 1.23 1.09  CALCIUM 9.0 8.7*   Liver Function Tests No results for input(s): AST, ALT, ALKPHOS, BILITOT, PROT, ALBUMIN in the last 72 hours. No results for input(s): LIPASE, AMYLASE in the last 72 hours. Cardiac Enzymes  Recent Labs  06/29/16 1802 06/29/16 2137 06/30/16 0308  TROPONINI 9.95* 11.95* 10.80*   BNP Invalid input(s): POCBNP D-Dimer No results for input(s): DDIMER in the last 72 hours. Hemoglobin A1C No results for input(s): HGBA1C in the last 72 hours. Fasting Lipid Panel  Recent Labs  06/30/16 0308  CHOL 214*  HDL 28*  LDLCALC 137*  TRIG 246*  CHOLHDL 7.6   Thyroid Function Tests No results for input(s): TSH, T4TOTAL, T3FREE, THYROIDAB in the last 72 hours.  Invalid input(s): [employer]  Telemetry  SR with PVCs and brief runs of NSVT.  ECG    SR  Radiology    Dg Chest 2 View  Result Date: 06/29/2016 CLINICAL DATA:  Chest pressure and shortness of breath. History of prior heart attacks. EXAM: CHEST  2 VIEW COMPARISON:  01/27/2016 FINDINGS: Postoperative changes in the mediastinum and cervical spine. Borderline heart size with normal pulmonary vascularity. No focal airspace disease or consolidation in the lungs. No blunting of costophrenic angles. No pneumothorax. Calcification of the aorta. IMPRESSION: No active cardiopulmonary disease. Electronically Signed   By: Lucienne Capers M.D.   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   1. Unstable Angina: Underwent PCI yesterday with Dr. Ellyn Hack and received DES x2 to the ostial and mid SVG-RPDA. Plan to continue on DAPT with ASA and Brilinta for 3 months. Trop peaked at  11.95, but now trending down. Does have noted ventricular ectopy on telemetry, but has not been on BB given his hx of bradycardia. Recently had his PPM removed by Dr. Rayann Heman earlier this year since it had not been used. Reports being on propanolol previously rx by his neurologist for tremors, requesting to resume this? Will discuss with MD.  -- Morning labs stable. 2D echo pending this morning. Will arrange follow up in the office.   2. HTN: Stable  3. HLD: Statin intolerant   Signed, Ivie Maese NP-C Pager [phone]  I have seen and examined the patient along with Reino Bellis NP-C.  I have reviewed the chart, notes and new data.  I agree with NP's note.  Key new complaints: feels much better, better than he has felt in weeks Key examination changes: no complications at access site; no clinical signs of CHF and no arrhythmia. Key new findings / data: cath films reviewed. Lengthy stented segment of SVG-RCA, moderate LAD stenosis beyond LIMA anastomosis, moderate left main and ostial LCX stenoses with diffuse severe disease in all the OM branches are all areas for future possible complications.   PLAN: Would probably keep on lifelong Brilinta, definitely uninterrupted for the next 12 months. Propranolol good choice for his tremor and for CAD. Carvedilol theoretically better for CHF/LV dysfunction, but he does not have overt CHF. Will start at 5 mg TID, with plan for him to titrate up to his previous dose of 10 mg TID if well tolerated. If necessary for excessive drop in BP, can reduce the amlodipine dose. Need to get him started on PCSK9 inhibitors. LDL unacceptable high. Will try low dose pravastatin, although this will not be sufficient to reach LDL<70. Will ask our clinical pharmacists to explore [employer] insurance coverage. Will try to get echo before we discharge him later today.  Sanda Klein, MD, [place] [phone] 06/30/2016, 10:50 AM

## 2016-06-30 NOTE — Care Management Note (Signed)
Case Management Note  Patient Details  Name: PRENTICE SACKRIDER MRN: [id number] Date of Birth: Jan 03, 1956  Subjective/Objective: S/p coronary stent intervention, will be on brilinta, pta indep, no needs for dc .                   Action/Plan:   Expected Discharge Date:                  Expected Discharge Plan:  Home/Self Care  In-House Referral:     Discharge planning Services  CM Consult  Post Acute Care Choice:    Choice offered to:     DME Arranged:    DME Agency:     HH Arranged:    HH Agency:     Status of Service:  Completed, signed off  If discussed at [employer] of Stay Meetings, dates discussed:    Additional Comments:  Zenon Mayo, RN 06/30/2016, 4:14 PM

## 2016-06-30 NOTE — Discharge Summary (Signed)
Discharge Summary    Patient ID: [name],  MRN: [id number], DOB/AGE: [birth year] 60 y.o.  Admit date: 06/29/2016 Discharge date: 06/30/2016  Primary Care Provider: Pcp Not In System Primary Cardiologist: Dr. Ellyn Hack  Discharge Diagnoses    Active Problems:   NSTEMI (non-ST elevated myocardial infarction), 01/17/13 PCI LCX AV Groove with Xience DES. and angiosculpt. unable to cross occluded OM.  EF 40 %   Unstable angina (HCC)   Allergies Allergies  Allergen Reactions  . Crestor [Rosuvastatin] Other (See Comments)    REACTION: Muscle aches  . Lipitor [Atorvastatin] Cough    ? cough  . Zocor [Simvastatin] Other (See Comments)    REACTION: Muscle aches    Diagnostic Studies/Procedures    LHC: 06/29/16  Conclusion     EXISTING NATIVE AND SVG CAD  Ost LM to LM lesion, 20 %stenosed.  Ost LAD to Prox LAD lesion, 90 %stenosed. Mid LAD lesion, 100 %stenosed.  Dist LAD lesion, 70 %stenosed. beyond LIMA  Ramus-1 lesion, 95 %stenosed. Grafted with Ramus-2 lesion, stent 45 %stenosed.  Ost Cx lesion, 55 %stenosed.. 1st Mrg lesion, 100 %stenosed. Dist Cx lesion, 90 %stenosed (new lesion, not good PCI target). Ost 2nd Mrg to 2nd Mrg lesion, 99 %stenosed.  Prox RCA lesion, 80 %stenosed. Mid RCA lesion, 100 %stenosed.  LIMA Graft is normal to dLAD.  SVG-OM1, known100% occlusion proximal to stent  LV end diastolic pressure is mildly elevated. There is no aortic valve stenosis.  ---  ---  CULPRIT LESIONS / PCI  SVG-rPDA, Origin lesion, 80 %stenosed. In-stent Restenosis  A STENT PROMUS PREM MR 3.5X28 drug eluting stent was successfully placed, and overlaps previously placed stent.  Post intervention, there is a 0% residual stenosis.  SVG-rPDA, Prox-Graft to Mid Graft lesion, 95 %stenosed.  The left ventricular systolic function appears to be normal. The left ventricular ejection fraction is 50-55% by visual estimate.  A STENT PROMUS PREM MR 3.5X24 drug  eluting stent was successfully placed, and overlaps previously placed stent.  Post intervention, there is a 0% residual stenosis.   Severe native vessel disease with essentially occluded LAD, OM1 and RCA with severe disease in the ostial circumflex as well as distal circumflex. He has a known occlusion of the vein graft to OM 2 with severe disease in the ostial and mid SVG-RPDA. The ostial lesion is an 80% stenosis. Both lesions in the SVG-RPDA were treated with overlapping Promus 3.5 mm x 28 mm and 24 mm overlapping stents. The more proximal stent covers the entire previously stented segment.  Plan:  Overnight admission to post procedure unit for sheath removal.  Dual and play therapy for minimum of 3 months, could then stop aspirin after 3 months and continue Brilinta.  Monitor blood pressures overnight.  Otherwise aggressive risk factor modification.   Diagnostic Diagram     Post-Intervention Diagram       _____________   History of Present Illness     [name] is a 60 yo male with past medical history of CAD-CABG in 2002, with LIMA to the LAD, SVG to the ramus intermediate, SVG to the OM and SVG to the PDA. He had stenting of his vein grafts to the PDA and OM in 2011, with bare-metal stents. His last cath was May 2016 showing grafts to the LAD, ramus intermediate and PAD were patent. Grafts to the OM was occluded, and had patent circumflex stents but occluded OM1 and OM 2 stents. His EF was noted to be  normal, and there was no significant change from prior caths with plan to continue medical treatment at that time. He was last seen in the office by Kerin Ransom on 1/17 where he was followed up for his elevated blood pressure. At that visit he denied any anginal symptoms, his Norvasc was increased to 10 mg.  Reports being in his usual state of health until a few days prior to presentation to the ED. States it had some intermittent episodes of chest pressure, associated with  rest and activity. Yesterday around 12 PM he was unloading wood from a trailer and developed 10/10 chest pressure with associated diaphoresis, shortness of breath and nausea. Reports this pain eventually subsided, but did not resolve itself. He had another episode of chest pressure later in the evening after finishing dinner, and then was awoken at 2:30 AM this morning for a third time. Reports taking 3 sublingual nitroglycerin with some relief, but did not fully resolve his pain. Reports that in relation to previous stenting that this episode felt exactly the same. Did state that he periodically checked his blood pressure and noted to be hypertensive with systolic in the 478G, and diastolic in the 956O, which he reports is abnormal for him. Around 4:30 this morning he told his wife that he wanted to come to the ER for further evaluation.  In the ER his labs were significant for normal electrolytes, POC troponin 0.15>> 0.40, stable hemoglobin, and chest x-ray with no active disease. His EKG showed sinus bradycardia with known T-wave inversion in AVR, aVL and V1. He was given 2 sublingual nitroglycerin in the ER with some relief currently rating his pain a 5/10. Ordered to receive a dose of morphine.   Hospital Course     Consultants: None  He was admitted and underwent lengthy LHC with Dr. Ellyn Hack with DES x2 to SVG-RPDA with moderate LAD stenosis beyond LIMA anastomosis, moderate left main and ostial LCX stenoses with diffuse severe disease in all the OM branches. He was started on ASA and Brilinta with plans to continue for at least 3 months, with possibly discontinuing ASA after 3 months. Likely to need lifelong Brilinta. Trop peaked at 11.95, morning labs were stable post intervention the following morning. He reported being on propranolol in the past for tremors and requested we add this back. Will start at 5mg  TID with plans to titrate up in the outpatient setting if tolerated. Given his elevated LDL,  will restart low dose pravastatin, and have sent a referral to the pharmacists regarding PCSK9 inhibitors. Also received a 2D echo prior to discharge showing normal EF. He did have some low blood pressure reading this admission, will advise him to hold his hydralazine until his follow up visit, to ensure he does not have any hypotension after discharge.  _____________  Discharge Vitals Blood pressure 134/80, pulse 69, temperature 97.7 F (36.5 C), temperature source Oral, resp. rate 19, height 5\' 10"  (1.778 m), weight 251 lb 5.2 oz (114 kg), SpO2 97 %.  Filed Weights   06/29/16 0613 06/30/16 0643  Weight: 250 lb (113.4 kg) 251 lb 5.2 oz (114 kg)    Labs & Radiologic Studies    CBC  Recent Labs  06/29/16 0622 06/30/16 0308  WBC 11.7* 11.7*  HGB 15.3 14.6  HCT 45.0 43.2  MCV 83.3 83.1  PLT 272 130   Basic Metabolic Panel  Recent Labs  06/29/16 0622 06/30/16 0308  NA 138 134*  K 3.8 3.7  CL 102 105  CO2 27 22  GLUCOSE 109* 109*  BUN 13 12  CREATININE 1.23 1.09  CALCIUM 9.0 8.7*   Liver Function Tests No results for input(s): AST, ALT, ALKPHOS, BILITOT, PROT, ALBUMIN in the last 72 hours. No results for input(s): LIPASE, AMYLASE in the last 72 hours. Cardiac Enzymes  Recent Labs  06/29/16 1802 06/29/16 2137 06/30/16 0308  TROPONINI 9.95* 11.95* 10.80*   BNP Invalid input(s): POCBNP D-Dimer No results for input(s): DDIMER in the last 72 hours. Hemoglobin A1C No results for input(s): HGBA1C in the last 72 hours. Fasting Lipid Panel  Recent Labs  06/30/16 0308  CHOL 214*  HDL 28*  LDLCALC 137*  TRIG 246*  CHOLHDL 7.6   Thyroid Function Tests No results for input(s): TSH, T4TOTAL, T3FREE, THYROIDAB in the last 72 hours.  Invalid input(s): FREET3 _____________  Dg Chest 2 View  Result Date: 06/29/2016 CLINICAL DATA:  Chest pressure and shortness of breath. History of prior heart attacks. EXAM: CHEST  2 VIEW COMPARISON:  01/27/2016 FINDINGS:  Postoperative changes in the mediastinum and cervical spine. Borderline heart size with normal pulmonary vascularity. No focal airspace disease or consolidation in the lungs. No blunting of costophrenic angles. No pneumothorax. Calcification of the aorta. IMPRESSION: No active cardiopulmonary disease. Electronically Signed   By: Lucienne Capers M.D.   On: 06/29/2016 06:31   Disposition   Pt is being discharged home today in good condition.  Follow-up Plans & Appointments    Follow-up Information    Barrett, Suanne Marker, PA-C Follow up on 07/07/2016.   Specialties:  Cardiology, Radiology Why:  8:45am for your hospital follow up.  Contact information: [address] [place] [address] [phone]          Discharge Instructions    AMB Referral to Cardiac Rehabilitation - Phase II    Complete by:  As directed    Diagnosis:   Coronary Stents NSTEMI CABG     CABG X ___:  3   Amb Referral to Cardiac Rehabilitation    Complete by:  As directed    Referring to [facility] Phase 2   Diagnosis:   NSTEMI Coronary Stents     Diet - low sodium heart healthy    Complete by:  As directed    Discharge instructions    Complete by:  As directed    Groin Site Care Refer to this sheet in the next few weeks. These instructions provide you with information on caring for yourself after your procedure. Your caregiver may also give you more specific instructions. Your treatment has been planned according to current medical practices, but problems sometimes occur. Call your caregiver if you have any problems or questions after your procedure. HOME CARE INSTRUCTIONS You may shower 24 hours after the procedure. Remove the bandage (dressing) and gently wash the site with plain soap and water. Gently pat the site dry.  Do not apply powder or lotion to the site.  Do not sit in a bathtub, swimming pool, or whirlpool for 5 to 7 days.  No bending, squatting, or lifting anything over 10 pounds (4.5  kg) as directed by your caregiver.  Inspect the site at least twice daily.  Do not drive home if you are discharged the same day of the procedure. Have someone else drive you.  You may drive 24 hours after the procedure unless otherwise instructed by your caregiver.  What to expect: Any bruising will usually fade within 1 to 2 weeks.  Blood that  collects in the tissue (hematoma) may be painful to the touch. It should usually decrease in size and tenderness within 1 to 2 weeks.  SEEK IMMEDIATE MEDICAL CARE IF: You have unusual pain at the groin site or down the affected leg.  You have redness, warmth, swelling, or pain at the groin site.  You have drainage (other than a small amount of blood on the dressing).  You have chills.  You have a fever or persistent symptoms for more than 72 hours.  You have a fever and your symptoms suddenly get worse.  Your leg becomes pale, cool, tingly, or numb.  You have heavy bleeding from the site. Hold pressure on the site. .   Increase activity slowly    Complete by:  As directed       Discharge Medications   Current Discharge Medication List    START taking these medications   Details  aspirin 81 MG chewable tablet Chew 1 tablet (81 mg total) by mouth before cath procedure.    pravastatin (PRAVACHOL) 20 MG tablet Take 1 tablet (20 mg total) by mouth at bedtime. Qty: 30 tablet, Refills: 3    propranolol (INDERAL) 10 MG tablet Take 0.5 tablets (5 mg total) by mouth 3 (three) times daily. Qty: 90 tablet, Refills: 6    ticagrelor (BRILINTA) 90 MG TABS tablet Take 1 tablet (90 mg total) by mouth 2 (two) times daily. Qty: 60 tablet, Refills: 0      CONTINUE these medications which have NOT CHANGED   Details  amLODipine (NORVASC) 10 MG tablet Take 1 tablet (10 mg total) by mouth daily. Qty: 90 tablet, Refills: 3    buPROPion (WELLBUTRIN SR) 150 MG 12 hr tablet Take 150 mg by mouth 2 (two) times daily.    clonazePAM (KLONOPIN) 2 MG tablet Take  1 tablet (2 mg total) by mouth at bedtime. Qty: 30 tablet, Refills: 0    cyclobenzaprine (FLEXERIL) 10 MG tablet Take 10 mg by mouth 3 (three) times daily as needed for muscle spasms.     hydrochlorothiazide (MICROZIDE) 12.5 MG capsule Take 1 capsule (12.5 mg total) by mouth daily. Qty: 90 capsule, Refills: 3    HYDROcodone-acetaminophen (NORCO/VICODIN) 5-325 MG per tablet Take 1 tablet by mouth every 6 (six) hours as needed for moderate pain.    lisinopril (PRINIVIL,ZESTRIL) 40 MG tablet Take 1 tablet (40 mg total) by mouth daily. Qty: 90 tablet, Refills: 3    NITROSTAT 0.4 MG SL tablet PLACE 1TAB UNDER TONGUE EVERY 5MIN FOR 3DOSES AS NEEDED FOR CHEST PAIN Qty: 25 tablet, Refills: 4      STOP taking these medications     hydrALAZINE (APRESOLINE) 25 MG tablet          Aspirin prescribed at discharge?  Yes High Intensity Statin Prescribed? (Lipitor 40-80mg  or Crestor 20-40mg ): No: Pravastatin started, Hx of intolerance. Beta Blocker Prescribed? Yes For EF <40%, was ACEI/ARB Prescribed? Yes ADP Receptor Inhibitor Prescribed? (i.e. Plavix etc.-Includes Medically Managed Patients): Yes For EF <40%, Aldosterone Inhibitor Prescribed? No:  Was EF assessed during THIS hospitalization? Yes Was Cardiac Rehab II ordered? (Included Medically managed Patients): Yes   Outstanding Labs/Studies   None  Duration of Discharge Encounter   Greater than 30 minutes including physician time.  Signed, Amit Leece NP-C 06/30/2016, 3:27 PM

## 2016-07-01 NOTE — Telephone Encounter (Signed)
Patient contacted regarding discharge from [facility] on 06/30/16.    Patient understands to follow up with provider Rosaria Ferries PA-C on 07/07/16 at [facility].  Patient understands discharge instructions? yes  Patient understands medications and regiment? yes  Patient understands to bring all medications to this visit? yes    Pt reports he feels "great". Denies questions or concerns with medications at this time.  Advised to call office if he has any further questions or concerns. Pt verbalized understanding.

## 2016-07-07 ENCOUNTER — Ambulatory Visit (INDEPENDENT_AMBULATORY_CARE_PROVIDER_SITE_OTHER): Payer: Medicare PPO | Admitting: Physician Assistant

## 2016-07-07 ENCOUNTER — Telehealth: Payer: Self-pay | Admitting: Pharmacist Clinician (PhC)/ Clinical Pharmacy Specialist

## 2016-07-07 ENCOUNTER — Encounter: Payer: Self-pay | Admitting: Physician Assistant

## 2016-07-07 VITALS — BP 122/78 | HR 69 | Ht 70.0 in | Wt 265.0 lb

## 2016-07-07 DIAGNOSIS — I214 Non-ST elevation (NSTEMI) myocardial infarction: Secondary | ICD-10-CM

## 2016-07-07 DIAGNOSIS — I200 Unstable angina: Secondary | ICD-10-CM

## 2016-07-07 DIAGNOSIS — E785 Hyperlipidemia, unspecified: Secondary | ICD-10-CM | POA: Diagnosis not present

## 2016-07-07 MED ORDER — PRAVASTATIN SODIUM 20 MG PO TABS: 20.0000 mg | ORAL_TABLET | Freq: Every day | ORAL | 3 refills | Status: DC

## 2016-07-07 MED ORDER — TICAGRELOR 90 MG PO TABS: 90.0000 mg | ORAL_TABLET | Freq: Two times a day (BID) | ORAL | 3 refills | Status: DC

## 2016-07-07 MED ORDER — PROPRANOLOL HCL 10 MG PO TABS: 5.0000 mg | ORAL_TABLET | Freq: Three times a day (TID) | ORAL | 3 refills | Status: DC

## 2016-07-07 MED ORDER — LISINOPRIL 40 MG PO TABS: 40.0000 mg | ORAL_TABLET | Freq: Every day | ORAL | 3 refills | Status: DC
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
## 2016-07-07 NOTE — Telephone Encounter (Signed)
-----   Message from Sanda Klein, MD sent at 06/30/2016 11:07 AM EDT ----- Shelbie Proctor, This gentleman has about 20 years of CAD history with CABG and countless PCI procedures. LDL 137, previously intolerant to Lipitor, Zocor and even once weekly Crestor ("disabling muscle weakness"). I am going to try pravastatin low dose, but it will not get him to goal and he may not tolerate that either. Please start the process for Repatha for him. Thanks! MCr

## 2016-07-07 NOTE — Telephone Encounter (Signed)
Spoke with patient, he will start pravastatin today or tomorrow.  He would like to try for a few weeks to see if will cause similar muscle problems to previous statin drugs.  He states he has also previously tried ezetimibe with the same reaction.    He knows to call the office in 2-3 weeks if not tolerating the pravastatin and we will do paperwork to start the PCSK-9 inhibitors.

## 2016-07-07 NOTE — Patient Instructions (Signed)
Labwork:  Your physician recommends that you return for lab work in: 3 months    Follow-Up:  Your physician recommends that you schedule a follow-up appointment in: 3 months with Dr. Ellyn Hack.

## 2016-07-15 ENCOUNTER — Telehealth: Payer: Self-pay | Admitting: Pharmacist

## 2016-07-15 NOTE — Telephone Encounter (Signed)
Patient called to report that pravastatin is causing similar muscles symptoms to his previous trials on statin medications. He states he is unable to tolerate the aching and wanting to come off pravastatin.   Will have him stop pravastatin and start paperwork for Repatha twice monthly injections. Pt is aware of injectible therapy and will have wife help give injections.

## 2016-07-22 NOTE — Telephone Encounter (Signed)
Will send in paperwork for Repatha 140 mg q14d thru Cover My Meds

## 2016-08-09 ENCOUNTER — Telehealth: Payer: Self-pay | Admitting: Pharmacist Clinician (PhC)/ Clinical Pharmacy Specialist

## 2016-08-09 MED ORDER — EVOLOCUMAB 140 MG/ML ~~LOC~~ SOAJ: 140.0000 mg | SUBCUTANEOUS | 12 refills | Status: DC

## 2016-08-09 NOTE — Telephone Encounter (Signed)
Patient received letter - Repatha covered until 08/05/2018.  Does not know what copay will be.  Letter states to send rx to any pharmacy of his choice.  Will send to [place], [place].  Patient aware to call should copay be cost prohibitive.    WIll have him repeat labs after 5 th dose

## 2016-08-27 ENCOUNTER — Telehealth: Payer: Self-pay | Admitting: Pharmacist

## 2016-08-27 NOTE — Telephone Encounter (Signed)
Pt called stating that he took first Repatha injection on Thursday of last week.   He states he did well the first day or two, but now he feels as if he is extremely lethargic. He states this has gotten progressively worse over the last few days.  He denies chest pain or tightness and states he just feels tired and somewhat short of breath with exertion. He states he is not worried he is having an event, but wondering if this could be related to the medication.   Fatigue is listed as a side effect but I am somewhat leery that the medication is the sole cause since it is getting progressively worse the further from the injection.   Asked him to monitor, if he gets any worse advised to proceed to ER. He states he understands. Advised that if not better by next week would defer injection until better and then we can rechallenge to see if it is truly the medication. He is agreeable to this plan. Will plan to follow up with him next week. He was also given our phone number again to call with any additional concerns.   Pt states appreciation for help.

## 2016-09-01 ENCOUNTER — Telehealth: Payer: Self-pay | Admitting: Pharmacist

## 2016-09-01 NOTE — Telephone Encounter (Signed)
Spoke pt and he states he feels much better. He is willing to proceed with Repatha injection tomorrow as schedule. He will call early next week if symptoms recur.   Pt states appreciation for follow up phone call.

## 2016-09-29 ENCOUNTER — Ambulatory Visit: Payer: Medicare PPO | Admitting: Cardiology

## 2016-11-18 ENCOUNTER — Ambulatory Visit (INDEPENDENT_AMBULATORY_CARE_PROVIDER_SITE_OTHER): Payer: Medicare PPO | Admitting: Cardiology

## 2016-11-18 ENCOUNTER — Encounter: Payer: Self-pay | Admitting: Cardiology

## 2016-11-18 VITALS — BP 155/96 | HR 58 | Ht 70.0 in | Wt 265.8 lb

## 2016-11-18 DIAGNOSIS — I10 Essential (primary) hypertension: Secondary | ICD-10-CM | POA: Diagnosis not present

## 2016-11-18 DIAGNOSIS — E669 Obesity, unspecified: Secondary | ICD-10-CM | POA: Diagnosis not present

## 2016-11-18 DIAGNOSIS — E785 Hyperlipidemia, unspecified: Secondary | ICD-10-CM | POA: Diagnosis not present

## 2016-11-18 DIAGNOSIS — G250 Essential tremor: Secondary | ICD-10-CM

## 2016-11-18 DIAGNOSIS — I25700 Atherosclerosis of coronary artery bypass graft(s), unspecified, with unstable angina pectoris: Secondary | ICD-10-CM

## 2016-11-18 MED ORDER — LOSARTAN POTASSIUM 100 MG PO TABS: 100.0000 mg | ORAL_TABLET | Freq: Every day | ORAL | 3 refills | Status: DC

## 2016-11-18 NOTE — Patient Instructions (Signed)
STOP LISINOPRIL - DUE TO YOUR DEY COUGH  START LOSARTAN 100 MG ONE TABLET ONCE  A DAILY.    STOP TAKING ASPIRIN THE END OF MARCH 2018    Your physician recommends that you schedule a follow-up appointment in 2 [place]    Your physician recommends that you schedule a follow-up appointment in:may 2018 with Dr Ellyn Hack     If you need a refill on your cardiac medications before your next appointment, please call your pharmacy.
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
## 2016-11-20 NOTE — Assessment & Plan Note (Signed)
Status post PCI to resurrect an occluded vein graft to the RCA system. Treatment DES stents. No further anginal symptoms. Continues to be on aspirin and Brilinta. I think by March timeframe we can probably see stop the aspirin since he is having a lot of bleeding. He is on propranolol for headache reasons and also for cardiac reasons along with lisinopril. Not on statin because he is on Repatha.  Besides converting from an ACE inhibitor to ARB and stopping aspirin in March, no other change continue current management.

## 2016-11-20 NOTE — Assessment & Plan Note (Signed)
The use of propranolol for essential tremor does give [place] a beta blocker, but not very effective blood pressure control. If necessary, we could consider switching versus simply adding another medicine for better blood pressure control.

## 2016-11-20 NOTE — Assessment & Plan Note (Signed)
He had some significant difficulties with his blood pressure in the past and now is on high-dose lisinopril along with propranolol which I'm not sure how back Started. But for now the plan is to convert from lisinopril 20 mg of losartan 100 mg. He may very well need to be switched to a more potent ARB. It also consider changing beta blocker although his heart rate is preload baseline.  He is being followed by Despina Arias in our lipid and hypertension clinic. He is due to be seen shortly to review his Repatha eye, I would estimate simply monitor the effect of the change in ACE inhibitor to ARB. Could either switch ARB and/or consider changing beta blocker versus adding another medicine.

## 2016-11-20 NOTE — Assessment & Plan Note (Signed)
The patient understands the need to lose weight with diet and exercise. We have discussed specific strategies for this.  

## 2016-11-20 NOTE — Assessment & Plan Note (Signed)
No longer able to take statins or Zetia. He is now on Repatha doing very well. Most recent labs show excellent control. Being followed by Tommy Medal, RPH in [place] (lipid / HTN / warfarin clinic). Tolerating Repatha without side effects.

## 2016-11-25 ENCOUNTER — Telehealth (HOSPITAL_COMMUNITY): Payer: Self-pay | Admitting: Family Medicine

## 2016-11-25 ENCOUNTER — Encounter (HOSPITAL_COMMUNITY): Payer: Self-pay | Admitting: Family Medicine

## 2016-11-25 NOTE — Telephone Encounter (Signed)
Mailed letter with [facility] along with my chart message... KJ  °

## 2016-12-02 ENCOUNTER — Ambulatory Visit: Payer: Medicare PPO

## 2016-12-22 ENCOUNTER — Telehealth: Payer: Self-pay | Admitting: Pharmacist Clinician (PhC)/ Clinical Pharmacy Specialist

## 2016-12-22 NOTE — Telephone Encounter (Signed)
Letter and lab order sent

## 2016-12-30 ENCOUNTER — Ambulatory Visit: Payer: Medicare PPO

## 2017-01-13 ENCOUNTER — Ambulatory Visit: Payer: Medicare PPO

## 2017-01-13 ENCOUNTER — Ambulatory Visit (INDEPENDENT_AMBULATORY_CARE_PROVIDER_SITE_OTHER): Payer: Medicare PPO | Admitting: Pharmacist Clinician (PhC)/ Clinical Pharmacy Specialist

## 2017-01-13 ENCOUNTER — Encounter: Payer: Self-pay | Admitting: Pharmacist Clinician (PhC)/ Clinical Pharmacy Specialist

## 2017-01-13 VITALS — BP 152/100 | HR 68

## 2017-01-13 DIAGNOSIS — E785 Hyperlipidemia, unspecified: Secondary | ICD-10-CM | POA: Diagnosis not present

## 2017-01-13 DIAGNOSIS — I10 Essential (primary) hypertension: Secondary | ICD-10-CM | POA: Diagnosis not present

## 2017-01-13 LAB — COMPLETE METABOLIC PANEL WITH GFR
ALBUMIN: 4.2 g/dL (ref 3.6–5.1)
ALK PHOS: 71 U/L (ref 40–115)
ALT: 17 U/L (ref 9–46)
AST: 26 U/L (ref 10–35)
BILIRUBIN TOTAL: 0.9 mg/dL (ref 0.2–1.2)
BUN: 23 mg/dL (ref 7–25)
CO2: 24 mmol/L (ref 20–31)
CREATININE: 1.9 mg/dL — AB (ref 0.70–1.25)
Calcium: 9.4 mg/dL (ref 8.6–10.3)
Chloride: 102 mmol/L (ref 98–110)
GFR, EST AFRICAN AMERICAN: 43 mL/min — AB (ref >=60)
GFR, Est Non African American: 37 mL/min — ABNORMAL LOW (ref >=60)
Glucose, Bld: 98 mg/dL (ref 65–99)
Potassium: 4.4 mmol/L (ref 3.5–5.3)
Sodium: 136 mmol/L (ref 135–146)
TOTAL PROTEIN: 7.5 g/dL (ref 6.1–8.1)

## 2017-01-13 LAB — LIPID PANEL
CHOLESTEROL: 94 mg/dL (ref <200)
HDL: 32 mg/dL — AB (ref >40)
LDL CALC: 34 mg/dL (ref <100)
TRIGLYCERIDES: 139 mg/dL (ref <150)
Total CHOL/HDL Ratio: 2.9 Ratio (ref <5.0)
VLDL: 28 mg/dL (ref <30)

## 2017-01-13 MED ORDER — CHLORTHALIDONE 25 MG PO TABS: 25.0000 mg | ORAL_TABLET | Freq: Every day | ORAL | 3 refills | Status: DC

## 2017-01-13 MED ORDER — PROPRANOLOL HCL 10 MG PO TABS: 10.0000 mg | ORAL_TABLET | Freq: Two times a day (BID) | ORAL | 3 refills | Status: DC

## 2017-01-13 NOTE — Patient Instructions (Addendum)
Return for a a follow up appointment in 4-6 weeks  Your blood pressure today is 152/100   (goal is < 130/80)  Check your blood pressure at home 3-4 times per week and keep record of the readings.  Take your BP meds as follows:  Chlorthalidone 25 mg each morning  Losartan 100 mg each evening  Propranolol 10 mg twice daily  Bring all of your meds, your BP cuff and your record of home blood pressures to your next appointment.  Exercise as you're able, try to walk approximately 30 minutes per day.  Keep salt intake to a minimum, especially watch canned and prepared boxed foods.  Eat more fresh fruits and vegetables and fewer canned items.  Avoid eating in fast food restaurants.    HOW TO TAKE YOUR BLOOD PRESSURE: . Rest 5 minutes before taking your blood pressure. .  Don't smoke or drink caffeinated beverages for at least 30 minutes before. . Take your blood pressure before (not after) you eat. . Sit comfortably with your back supported and both feet on the floor (don't cross your legs). . Elevate your arm to heart level on a table or a desk. . Use the proper sized cuff. It should fit smoothly and snugly around your bare upper arm. There should be enough room to slip a fingertip under the cuff. The bottom edge of the cuff should be 1 inch above the crease of the elbow. . Ideally, take 3 measurements at one sitting and record the average.

## 2017-01-13 NOTE — Assessment & Plan Note (Signed)
Labs drawn recently at PCP show LDL of 25 now on Repatha.  Had labs drawn this morning and will have those results later.  Encouraged patient to continue with the Repatha, he is to let us know if he develops andy problems with this medication.

## 2017-01-13 NOTE — Progress Notes (Signed)
01/13/2017 [name] 1955/12/15 [id number]   HPI:  [name] is a 61 y.o. male patient of Dr Ellyn Hack, with a PMH below who presents today for hypertension clinic follow up.  His cardiac history is significant for a NSTEMI in Sept 2017, with DES to LCX.  Prior to that, in 2002, he had CABG x 4.  He had been intolerant of statin medications and is now on Repatha 140 mg sq every 14 days.  He also has hypertension, OSA on CPAP, obesity and essential tremor, treated with propranolol.  His blood pressure has been running high recently, he believes since switching from lisinopril (cough) to losartan.  His propranolol prescription was changed at that same time from 10 mg bid to 5 mg tid.  He never did take it tid and thus has run out of medication in the past week.  Blood pressure readings have been higher at home recently, although he brings no readings in with him today.  States that he has had some financial stresses and notes his diastolic readings have gone as high as 110.    Today he is feeling well, no CP, SOB, dizziness or edema.   He admits to DOE, especially if has to climb stairs or go uphill.    Blood Pressure Goal:  130/80  Current Medications:  Losartan 100 mg qd (am)  Propranolol 5 mg tid  - prefers 10 mg bid  Family Hx:  Father had hypertension and hyperlipidemia  Mother had a heart murmur  Social Hx:  Never smoked, rare alcohol, sweet tea throughout day  Diet:  Mostly home cooked meals; no deep fried; more red meat lately; some added salt; steamed frozen veggies (not canned).  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, eating there more frequently on those days/  Exercise:  Stays active on his 2 acre property  Home BP readings:  Per patient recall systolic 174-081'K/ diastolic mostly > 90  Intolerances:   Lisinopril caused cough  Wt Readings from Last 3 Encounters:  11/18/16 265 lb 12.8 oz (120.6 kg)  07/07/16 265 lb (120.2  kg)  06/30/16 251 lb 5.2 oz (114 kg)   BP Readings from Last 3 Encounters:  01/13/17 (!) 152/100  11/18/16 (!) 155/96  07/07/16 122/78   Pulse Readings from Last 3 Encounters:  01/13/17 68  11/18/16 (!) 58  07/07/16 69    Current Outpatient Prescriptions  Medication Sig Dispense Refill  . aspirin 81 MG chewable tablet Chew 1 tablet (81 mg total) by mouth before cath procedure.    Marland Kitchen buPROPion (WELLBUTRIN SR) 150 MG 12 hr tablet Take 150 mg by mouth 2 (two) times daily.    . chlorthalidone (HYGROTON) 25 MG tablet Take 1 tablet (25 mg total) by mouth daily. 90 tablet 3  . clonazePAM (KLONOPIN) 2 MG tablet Take 1 tablet (2 mg total) by mouth at bedtime. 30 tablet 0  . cyclobenzaprine (FLEXERIL) 10 MG tablet Take 10 mg by mouth 3 (three) times daily as needed for muscle spasms.     . Evolocumab (REPATHA SURECLICK) 481 MG/ML SOAJ Inject 140 mg into the skin every 14 (fourteen) days. 2 pen 12  . HYDROcodone-acetaminophen (NORCO/VICODIN) 5-325 MG per tablet Take 1 tablet by mouth every 6 (six) hours as needed for moderate pain.    Marland Kitchen losartan (COZAAR) 100 MG tablet Take 1 tablet (100 mg total) by mouth daily. 90 tablet 3  . NITROSTAT 0.4 MG SL tablet  PLACE 1TAB UNDER TONGUE EVERY 5MIN FOR 3DOSES AS NEEDED FOR CHEST PAIN 25 tablet 4  . propranolol (INDERAL) 10 MG tablet Take 1 tablet (10 mg total) by mouth 2 (two) times daily. 180 tablet 3  . ticagrelor (BRILINTA) 90 MG TABS tablet Take 1 tablet (90 mg total) by mouth 2 (two) times daily. 180 tablet 3   No current facility-administered medications for this visit.     Allergies  Allergen Reactions  . Crestor [Rosuvastatin] Other (See Comments)    REACTION: Muscle aches  . Lipitor [Atorvastatin] Cough    ? cough  . Lisinopril Cough    CHANGE TO LOSARTAN  . Zocor [Simvastatin] Other (See Comments)    REACTION: Muscle aches    Past Medical History:  Diagnosis Date  . Anginal pain ([place])   . Arthritis    "left shoulder; back"  (01/26/2016)  . Bradycardia by electrocardiogram 2006   Medtronic device  . CAD (coronary artery disease) of artery bypass graft, with stent to VG-OM-(BMS)    a. 06/29/16 PCI with DES x2 to SVG-RPDA  . CAD (coronary artery disease), with CABG LIMA-LAD, VG-ramus intermediate, SVG-OM, SVG-PDA 2002  . Chronic lower back pain    . Depression   . Dyslipidemia, with intolerance to Crestor and zocor and possible cough with Lipitor    . History of blood transfusion 04/2001   "when I had bypass OR"  . HTN (hypertension)    . Myocardial infarction    hx 6 heart attacks last one being in 2015 april? (01/26/2016)  . Obesity (BMI 30-39.9)   . OSA on CPAP    . Shortness of breath dyspnea    unable to climb a set of stairs  . Tremor, essential, treated with propranolol      Blood pressure (!) 152/100, pulse 68.  156/104  HTN (hypertension) Blood pressure still elevated in the office today, at 152/100.  Will have him move the losartan to evenings and add chlorthalidone 25 mg once daily each morning.   He is to continue with daily home BP checks and we will have him follow up with Dr. Ellyn Hack in 6 weeks.  We can continue to follow him and make adjustments after that should it be necessary.  Patient was asked to go to lab in 2 weeks for repeat BMET   Dyslipidemia, with intolerance to Crestor and zocor and possible cough with Lipitor Labs drawn recently at PCP show LDL of 25 now on Repatha.  Had labs drawn this morning and will have those results later.  Encouraged patient to continue with the Repatha, he is to let us know if he develops andy problems with this medication.   Tommy Medal PharmD CPP [facility]

## 2017-01-13 NOTE — Assessment & Plan Note (Addendum)
Blood pressure still elevated in the office today, at 152/100.  Will have him move the losartan to evenings and add chlorthalidone 25 mg once daily each morning.   He is to continue with daily home BP checks and we will have him follow up with Dr. Ellyn Hack in 6 weeks.  We can continue to follow him and make adjustments after that should it be necessary.  Patient was asked to go to lab in 2 weeks for repeat BMET

## 2017-01-20 ENCOUNTER — Telehealth: Payer: Self-pay | Admitting: Pharmacist Clinician (PhC)/ Clinical Pharmacy Specialist

## 2017-01-20 DIAGNOSIS — I10 Essential (primary) hypertension: Secondary | ICD-10-CM

## 2017-01-20 NOTE — Telephone Encounter (Signed)
Patient had BMET drawn day of last OV.  Was started on chlorthalidone 25 mg that day.  (May 5).  Results back today, showing increase in BMET to 1.9 from last draw in February (1.1 then).    Advised patient to hold chlorthalidone and will send him orders to get repeat BMET asap.    Of note he states most diastolic readings have dropped to 80-90.

## 2017-02-28 ENCOUNTER — Ambulatory Visit: Payer: Medicare PPO | Admitting: Cardiology
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
## 2017-03-10 ENCOUNTER — Ambulatory Visit (INDEPENDENT_AMBULATORY_CARE_PROVIDER_SITE_OTHER): Payer: Medicare PPO | Admitting: Physician Assistant

## 2017-03-10 ENCOUNTER — Telehealth: Payer: Self-pay | Admitting: *Deleted

## 2017-03-10 ENCOUNTER — Encounter: Payer: Self-pay | Admitting: Physician Assistant

## 2017-03-10 VITALS — BP 124/86 | HR 60 | Ht 70.0 in | Wt 263.0 lb

## 2017-03-10 DIAGNOSIS — I25708 Atherosclerosis of coronary artery bypass graft(s), unspecified, with other forms of angina pectoris: Secondary | ICD-10-CM | POA: Diagnosis not present

## 2017-03-10 DIAGNOSIS — E785 Hyperlipidemia, unspecified: Secondary | ICD-10-CM | POA: Diagnosis not present

## 2017-03-10 DIAGNOSIS — Z01818 Encounter for other preprocedural examination: Secondary | ICD-10-CM

## 2017-03-10 DIAGNOSIS — I10 Essential (primary) hypertension: Secondary | ICD-10-CM | POA: Diagnosis not present

## 2017-03-10 NOTE — Telephone Encounter (Signed)
Called pt to let him know that Dr. Ellyn Hack ok'd for him to hold the Brilinta for his upcoming LIthrotripsy, and to make sure he resumes it as soon as his surgeon gives him the ok.  Pt was very appreciative for the call and understands that we will fax the clearance form to [facility] at fax # [phone].

## 2017-03-10 NOTE — Patient Instructions (Signed)
Medication Instructions:  Your physician recommends that you continue on your current medications as directed. Please refer to the Current Medication list given to you today.   Labwork: None ordered  Testing/Procedures: None ordered  Follow-Up: Your physician recommends that you schedule a follow-up appointment in: WITH DR. HARDING AS SCHEDULED   Any Other Special Instructions Will Be Listed Below (If Applicable).  We will discuss with Dr. Ellyn Hack regarding your surgical clearance and we will call you.   If you need a refill on your cardiac medications before your next appointment, please call your pharmacy.

## 2017-03-30 ENCOUNTER — Ambulatory Visit: Payer: Medicare PPO | Admitting: Cardiology

## 2017-03-30 ENCOUNTER — Encounter: Payer: Self-pay | Admitting: *Deleted

## 2017-07-04 ENCOUNTER — Other Ambulatory Visit: Payer: Self-pay | Admitting: Cardiovascular Disease

## 2017-07-12 ENCOUNTER — Other Ambulatory Visit: Payer: Self-pay | Admitting: Physician Assistant

## 2017-07-26 ENCOUNTER — Other Ambulatory Visit: Payer: Self-pay | Admitting: Physician Assistant

## 2017-10-20 ENCOUNTER — Other Ambulatory Visit: Payer: Self-pay | Admitting: Neurosurgery

## 2017-10-20 DIAGNOSIS — M47816 Spondylosis without myelopathy or radiculopathy, lumbar region: Secondary | ICD-10-CM

## 2017-10-29 ENCOUNTER — Other Ambulatory Visit: Payer: Self-pay | Admitting: Cardiology

## 2017-10-31 NOTE — Telephone Encounter (Signed)
Rx(s) sent to pharmacy electronically.  

## 2017-11-03 ENCOUNTER — Other Ambulatory Visit: Payer: Medicare PPO

## 2017-11-07 ENCOUNTER — Telehealth: Payer: Self-pay | Admitting: Cardiology

## 2017-11-07 ENCOUNTER — Other Ambulatory Visit: Payer: Self-pay | Admitting: Neurosurgery

## 2017-11-07 ENCOUNTER — Ambulatory Visit
Admission: RE | Admit: 2017-11-07 | Discharge: 2017-11-07 | Disposition: A | Discharge status: Home / Self Care | Payer: Medicare Other | Source: Ambulatory Visit | Attending: Neurosurgery | Admitting: Neurosurgery

## 2017-11-07 DIAGNOSIS — M47816 Spondylosis without myelopathy or radiculopathy, lumbar region: Secondary | ICD-10-CM

## 2017-11-07 NOTE — Telephone Encounter (Signed)
Pt have been Angina more frequent,been taking Nitroglycerin. He has an appointment for Wednesday,please call to evaluate.

## 2017-11-07 NOTE — Telephone Encounter (Signed)
Spoke with pt, he reports he is having classic angina. Every 2-3 days he will get chest pain with exertion. He checks his bp and then take NTG. 2 NTG will usually get rid of the discomfort. He is having SOB with exertion but denies edema or orthopnea. He c/o a occ cough. He has an appt with dr Ellyn Hack on Wednesday. Patient voiced understanding to go to the ER for any pain unrelieved with NTG.

## 2017-11-09 ENCOUNTER — Encounter: Payer: Self-pay | Admitting: Cardiology

## 2017-11-09 ENCOUNTER — Ambulatory Visit: Payer: Medicare Other | Admitting: Cardiology

## 2017-11-09 VITALS — BP 147/93 | HR 72 | Ht 70.0 in | Wt 277.4 lb

## 2017-11-09 DIAGNOSIS — I200 Unstable angina: Secondary | ICD-10-CM

## 2017-11-09 DIAGNOSIS — E785 Hyperlipidemia, unspecified: Secondary | ICD-10-CM | POA: Diagnosis not present

## 2017-11-09 DIAGNOSIS — Z01810 Encounter for preprocedural cardiovascular examination: Secondary | ICD-10-CM | POA: Diagnosis not present

## 2017-11-09 DIAGNOSIS — D689 Coagulation defect, unspecified: Secondary | ICD-10-CM | POA: Diagnosis not present

## 2017-11-09 DIAGNOSIS — R0609 Other forms of dyspnea: Secondary | ICD-10-CM

## 2017-11-09 DIAGNOSIS — M5440 Lumbago with sciatica, unspecified side: Secondary | ICD-10-CM

## 2017-11-09 DIAGNOSIS — G8929 Other chronic pain: Secondary | ICD-10-CM | POA: Diagnosis not present

## 2017-11-09 DIAGNOSIS — I214 Non-ST elevation (NSTEMI) myocardial infarction: Secondary | ICD-10-CM

## 2017-11-09 DIAGNOSIS — I10 Essential (primary) hypertension: Secondary | ICD-10-CM

## 2017-11-09 DIAGNOSIS — E669 Obesity, unspecified: Secondary | ICD-10-CM | POA: Diagnosis not present

## 2017-11-09 DIAGNOSIS — I25700 Atherosclerosis of coronary artery bypass graft(s), unspecified, with unstable angina pectoris: Secondary | ICD-10-CM | POA: Diagnosis not present

## 2017-11-09 DIAGNOSIS — G4733 Obstructive sleep apnea (adult) (pediatric): Secondary | ICD-10-CM

## 2017-11-09 DIAGNOSIS — Z9989 Dependence on other enabling machines and devices: Secondary | ICD-10-CM

## 2017-11-09 MED ORDER — ASPIRIN EC 81 MG PO TBEC: 81.0000 mg | DELAYED_RELEASE_TABLET | Freq: Every day | ORAL | 3 refills | Status: DC

## 2017-11-09 MED ORDER — PROPRANOLOL HCL 20 MG PO TABS: 20.0000 mg | ORAL_TABLET | Freq: Three times a day (TID) | ORAL | 3 refills | Status: DC

## 2017-11-09 MED ORDER — ISOSORBIDE MONONITRATE ER 30 MG PO TB24: 30.0000 mg | ORAL_TABLET | Freq: Every day | ORAL | 3 refills | Status: DC
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
## 2017-11-09 NOTE — Assessment & Plan Note (Signed)
Morbidly obese.  Still on CPAP.  Concern for possible pulmonary hypertension with progressive dyspnea and some orthopnea symptoms as well as weight gain. Plan: will do right heart catheter along with catheterization along with a left heart cath.  Also check 2D echo.

## 2017-11-09 NOTE — Patient Instructions (Addendum)
MEDICATION  INSTRUCTIONS    INCREASE PROPRANOLOL 20 MG THREE TIMES A DAY   START TAKING 30 MG ASPIRIN  30 MINUTES BEFORE TAKING -- ISOSORBIDE MON 30 MG DAILY     LABS TODAY - NOW CMP  LIPID CBC PT  SCHEDULE AT [place] physician has requested that you have a RIGHT AND LEFT  cardiac catheterization. Cardiac catheterization is used to diagnose and/or treat various heart conditions. Doctors may recommend this procedure for a number of different reasons. The most common reason is to evaluate chest pain. Chest pain can be a symptom of coronary artery disease (CAD), and cardiac catheterization can show whether plaque is narrowing or blocking your heart's arteries. This procedure is also used to evaluate the valves, as well as measure the blood flow and oxygen levels in different parts of your heart. For further information please visit [email or web address]. Please follow instruction sheet, as given.    Your physician recommends that you schedule a follow-up appointment in 2 [place].      [place] [address] [place] [address] Dept: [phone] Loc: [place]  11/09/2017  You are scheduled for a Cardiac Catheterization on Thursday, January 30 with Dr. [name].  1. Please arrive at the [facility] (Main Entrance A) at [facility]: [address] [place], [place] [address] at 11:30 AM (two hours before your procedure to ensure your preparation). Free valet parking service is available.   Special note: Every effort is made to have your procedure done on time. Please understand that emergencies sometimes delay scheduled procedures.  2. Diet: Do not eat or drink anything after midnight prior to your procedure except sips of water to take medications.  3. Labs: TODAY-- CMP, CBC, LIPID , PT  4. Medication instructions in  preparation for your procedure:      On the morning of your procedure, take your Aspirin  81 mg and Brilinta  90 mg and any morning medicines NOT listed above.  You may use sips of water.  5. Plan for one night stay--bring personal belongings. 6. Bring a current list of your medications and current insurance cards. 7. You MUST have a responsible person to drive you home. 8. Someone MUST be with you the first 24 hours after you arrive home or your discharge will be delayed. 9. Please wear clothes that are easy to get on and off and wear slip-on shoes.  Thank you for allowing [place] to care for you!   -- [place] [facility]

## 2017-11-09 NOTE — Assessment & Plan Note (Signed)
Patient with known coronary disease now with active anginal symptoms and likely heart failure symptoms.  Plan is right and left heart catheterization to evaluate ischemic etiology and for possible pulmonary pretension. Check 2D echocardiogram to reassess EF. We need to figure out his current active cardiac symptoms prior to considering preoperative clearance.

## 2017-11-09 NOTE — Assessment & Plan Note (Signed)
He has known CAD of his native vessels as well as significant graft disease by recent cath.  Up until about a month ago, he was doing very well with no major symptoms since his last PCI during a non-STEMI in 2017.  Unfortunately he now is presenting with worsening exertional dyspnea and anginal chest pain.   Plan: Proceed with RIGHT and LEFT HEART CATHETERIZATION with CORONARY and GRAFT ANGIOGRAPHY with possible PERCUTANEOUS CORONARY  INTERVENTION  Continue Brilinta, add aspirin  Add Imdur 30 mg daily, 30 minutes after aspirin  Increase propranolol to 20 mg 3 times daily.

## 2017-11-09 NOTE — Assessment & Plan Note (Signed)
Progressively worsening exertional dyspnea.  Plan to evaluate with right and left heart catheterization followed by echocardiogram.

## 2017-11-09 NOTE — Assessment & Plan Note (Signed)
Most recent event was a non-STEMI back in September 2017, before that he had non-STEMI in 2014.  Both episodes were associated with chest discomfort similar to his current symptoms.  Thankfully, despite having multiple infarcts, his EF has been preserved.  We will check a 2D echocardiogram after his heart catheterization.

## 2017-11-09 NOTE — Assessment & Plan Note (Signed)
As of April last year, his LDL was well controlled on Repatha.  Has been off Repatha for a few months now.  Need to get back on it.  Will check fasting lipid panel today.  Follow-up results with CV RR

## 2017-11-09 NOTE — Assessment & Plan Note (Signed)
Ongoing concern.  Unfortunately because of his back pain and now angina, he is not been able to do much exercise.  He is therefore gained quite a bit of weight.  We will evaluate filling pressures to determine if he actually needs any diuresis or not.  Hopefully after he can be revascularized and go to back surgery, he will be L to get back into an exercise program and lose weight.

## 2017-11-09 NOTE — Assessment & Plan Note (Addendum)
He has significant native 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  He now presenting with unstable anginal symptoms.  Plan is to re-look cardiac catheterization for ischemic etiology.  We will also do right heart catheterization to evaluate for any pulmonary pretension or increased filling pressures based on his weight gain and OSA. Continue Brilinta, and add aspirin back Increasing beta-blocker to 20 mg daily.  Continue high-dose losartan and chlorthalidone.  May need to consider Lasix.  Add Imdur He had been on Repatha, but somehow lost his prescription or it stopped coming in the fall.  He will need to reinitiate therapy, but will have a lipid panel checked today.
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
## 2017-11-10 ENCOUNTER — Encounter (HOSPITAL_COMMUNITY): Payer: Self-pay | Admitting: General Practice

## 2017-11-10 ENCOUNTER — Encounter (HOSPITAL_COMMUNITY)
Admission: RE | Disposition: A | Discharge status: Home / Self Care | Payer: Self-pay | Source: Ambulatory Visit | Attending: Cardiology

## 2017-11-10 ENCOUNTER — Other Ambulatory Visit: Payer: Self-pay

## 2017-11-10 ENCOUNTER — Other Ambulatory Visit: Payer: Self-pay | Admitting: Nurse Practitioner

## 2017-11-10 ENCOUNTER — Ambulatory Visit (HOSPITAL_COMMUNITY)
Admission: RE | Admit: 2017-11-10 | Discharge: 2017-11-11 | Disposition: A | Discharge status: Home / Self Care | Payer: Medicare Other | Source: Ambulatory Visit | Attending: Cardiology | Admitting: Cardiology

## 2017-11-10 DIAGNOSIS — G4733 Obstructive sleep apnea (adult) (pediatric): Secondary | ICD-10-CM | POA: Insufficient documentation

## 2017-11-10 DIAGNOSIS — I25700 Atherosclerosis of coronary artery bypass graft(s), unspecified, with unstable angina pectoris: Secondary | ICD-10-CM | POA: Insufficient documentation

## 2017-11-10 DIAGNOSIS — Y712 Prosthetic and other implants, materials and accessory cardiovascular devices associated with adverse incidents: Secondary | ICD-10-CM | POA: Diagnosis not present

## 2017-11-10 DIAGNOSIS — E785 Hyperlipidemia, unspecified: Secondary | ICD-10-CM | POA: Insufficient documentation

## 2017-11-10 DIAGNOSIS — F329 Major depressive disorder, single episode, unspecified: Secondary | ICD-10-CM | POA: Insufficient documentation

## 2017-11-10 DIAGNOSIS — T82855A Stenosis of coronary artery stent, initial encounter: Secondary | ICD-10-CM | POA: Insufficient documentation

## 2017-11-10 DIAGNOSIS — Z955 Presence of coronary angioplasty implant and graft: Secondary | ICD-10-CM

## 2017-11-10 DIAGNOSIS — I10 Essential (primary) hypertension: Secondary | ICD-10-CM | POA: Diagnosis present

## 2017-11-10 DIAGNOSIS — Z6830 Body mass index (BMI) 30.0-30.9, adult: Secondary | ICD-10-CM | POA: Insufficient documentation

## 2017-11-10 DIAGNOSIS — I25710 Atherosclerosis of autologous vein coronary artery bypass graft(s) with unstable angina pectoris: Secondary | ICD-10-CM | POA: Diagnosis not present

## 2017-11-10 DIAGNOSIS — Z981 Arthrodesis status: Secondary | ICD-10-CM | POA: Insufficient documentation

## 2017-11-10 DIAGNOSIS — Z9989 Dependence on other enabling machines and devices: Secondary | ICD-10-CM | POA: Insufficient documentation

## 2017-11-10 DIAGNOSIS — Z888 Allergy status to other drugs, medicaments and biological substances status: Secondary | ICD-10-CM | POA: Insufficient documentation

## 2017-11-10 DIAGNOSIS — Z7902 Long term (current) use of antithrombotics/antiplatelets: Secondary | ICD-10-CM | POA: Diagnosis not present

## 2017-11-10 DIAGNOSIS — M1990 Unspecified osteoarthritis, unspecified site: Secondary | ICD-10-CM | POA: Diagnosis not present

## 2017-11-10 DIAGNOSIS — Z79899 Other long term (current) drug therapy: Secondary | ICD-10-CM | POA: Insufficient documentation

## 2017-11-10 DIAGNOSIS — M545 Low back pain: Secondary | ICD-10-CM | POA: Insufficient documentation

## 2017-11-10 DIAGNOSIS — G250 Essential tremor: Secondary | ICD-10-CM | POA: Diagnosis not present

## 2017-11-10 DIAGNOSIS — I200 Unstable angina: Secondary | ICD-10-CM | POA: Diagnosis present

## 2017-11-10 DIAGNOSIS — I252 Old myocardial infarction: Secondary | ICD-10-CM | POA: Diagnosis not present

## 2017-11-10 DIAGNOSIS — I440 Atrioventricular block, first degree: Secondary | ICD-10-CM | POA: Diagnosis not present

## 2017-11-10 DIAGNOSIS — R0609 Other forms of dyspnea: Secondary | ICD-10-CM

## 2017-11-10 DIAGNOSIS — G8929 Other chronic pain: Secondary | ICD-10-CM | POA: Diagnosis not present

## 2017-11-10 DIAGNOSIS — I25110 Atherosclerotic heart disease of native coronary artery with unstable angina pectoris: Secondary | ICD-10-CM | POA: Diagnosis not present

## 2017-11-10 DIAGNOSIS — E669 Obesity, unspecified: Secondary | ICD-10-CM | POA: Insufficient documentation

## 2017-11-10 DIAGNOSIS — Z01810 Encounter for preprocedural cardiovascular examination: Secondary | ICD-10-CM

## 2017-11-10 DIAGNOSIS — M47816 Spondylosis without myelopathy or radiculopathy, lumbar region: Secondary | ICD-10-CM

## 2017-11-10 HISTORY — PX: CORONARY STENT INTERVENTION: CATH118234

## 2017-11-10 HISTORY — PX: LEFT HEART CATH AND CORS/GRAFTS ANGIOGRAPHY: CATH118250

## 2017-11-10 HISTORY — PX: CORONARY BALLOON ANGIOPLASTY: CATH118233

## 2017-11-10 LAB — COMPREHENSIVE METABOLIC PANEL
ALBUMIN: 4.4 g/dL (ref 3.6–4.8)
ALK PHOS: 66 IU/L (ref 39–117)
ALT: 28 IU/L (ref 0–44)
AST: 27 IU/L (ref 0–40)
Albumin/Globulin Ratio: 1.2 (ref 1.2–2.2)
BUN / CREAT RATIO: 10 (ref 10–24)
BUN: 16 mg/dL (ref 8–27)
Bilirubin Total: 1.1 mg/dL (ref 0.0–1.2)
CALCIUM: 9.8 mg/dL (ref 8.6–10.2)
CO2: 24 mmol/L (ref 20–29)
CREATININE: 1.68 mg/dL — AB (ref 0.76–1.27)
Chloride: 100 mmol/L (ref 96–106)
GFR, EST AFRICAN AMERICAN: 50 mL/min/{1.73_m2} — AB (ref >59)
GFR, EST NON AFRICAN AMERICAN: 43 mL/min/{1.73_m2} — AB (ref >59)
Globulin, Total: 3.6 g/dL (ref 1.5–4.5)
Glucose: 90 mg/dL (ref 65–99)
Potassium: 4.4 mmol/L (ref 3.5–5.2)
Sodium: 138 mmol/L (ref 134–144)
TOTAL PROTEIN: 8 g/dL (ref 6.0–8.5)

## 2017-11-10 LAB — LIPID PANEL
CHOLESTEROL TOTAL: 227 mg/dL — AB (ref 100–199)
Chol/HDL Ratio: 7.1 ratio — ABNORMAL HIGH (ref 0.0–5.0)
HDL: 32 mg/dL — ABNORMAL LOW (ref >39)
LDL Calculated: 133 mg/dL — ABNORMAL HIGH (ref 0–99)
Triglycerides: 310 mg/dL — ABNORMAL HIGH (ref 0–149)
VLDL Cholesterol Cal: 62 mg/dL — ABNORMAL HIGH (ref 5–40)

## 2017-11-10 LAB — BASIC METABOLIC PANEL
Anion gap: 11 (ref 5–15)
BUN: 14 mg/dL (ref 6–20)
CHLORIDE: 103 mmol/L (ref 101–111)
CO2: 22 mmol/L (ref 22–32)
Calcium: 9.1 mg/dL (ref 8.9–10.3)
Creatinine, Ser: 1.66 mg/dL — ABNORMAL HIGH (ref 0.61–1.24)
GFR calc non Af Amer: 43 mL/min — ABNORMAL LOW (ref >60)
GFR, EST AFRICAN AMERICAN: 50 mL/min — AB (ref >60)
Glucose, Bld: 108 mg/dL — ABNORMAL HIGH (ref 65–99)
POTASSIUM: 4.1 mmol/L (ref 3.5–5.1)
Sodium: 136 mmol/L (ref 135–145)

## 2017-11-10 LAB — POCT ACTIVATED CLOTTING TIME
ACTIVATED CLOTTING TIME: 252 s
Activated Clotting Time: 246 seconds
Activated Clotting Time: 323 seconds

## 2017-11-10 LAB — CBC
HEMATOCRIT: 45.3 % (ref 37.5–51.0)
HEMOGLOBIN: 15.6 g/dL (ref 13.0–17.7)
MCH: 28.3 pg (ref 26.6–33.0)
MCHC: 34.4 g/dL (ref 31.5–35.7)
MCV: 82 fL (ref 79–97)
Platelets: 300 10*3/uL (ref 150–379)
RBC: 5.51 x10E6/uL (ref 4.14–5.80)
RDW: 14.1 % (ref 12.3–15.4)
WBC: 9.2 10*3/uL (ref 3.4–10.8)

## 2017-11-10 LAB — PROTIME-INR
INR: 1 (ref 0.8–1.2)
Prothrombin Time: 10.6 s (ref 9.1–12.0)

## 2017-11-10 SURGERY — LEFT HEART CATH AND CORS/GRAFTS ANGIOGRAPHY
Anesthesia: LOCAL

## 2017-11-10 MED ORDER — LIDOCAINE HCL (PF) 1 % IJ SOLN
INTRAMUSCULAR | Status: AC
  Filled 2017-11-10: qty 30

## 2017-11-10 MED ORDER — VERAPAMIL HCL 2.5 MG/ML IV SOLN
INTRAVENOUS | Status: DC | PRN
  Administered 2017-11-10: 10 mL via INTRA_ARTERIAL

## 2017-11-10 MED ORDER — HYDROCODONE-ACETAMINOPHEN 5-325 MG PO TABS: 1.0000 | ORAL_TABLET | Freq: Four times a day (QID) | ORAL | Status: DC | PRN

## 2017-11-10 MED ORDER — CLONAZEPAM 0.5 MG PO TABS
2.0000 mg | ORAL_TABLET | Freq: Every day | ORAL | Status: DC
  Administered 2017-11-10: 2 mg via ORAL
  Filled 2017-11-10: qty 4

## 2017-11-10 MED ORDER — VERAPAMIL HCL 2.5 MG/ML IV SOLN
INTRAVENOUS | Status: AC
  Filled 2017-11-10: qty 2

## 2017-11-10 MED ORDER — HEPARIN (PORCINE) IN NACL 2-0.9 UNIT/ML-% IJ SOLN
INTRAMUSCULAR | Status: AC | PRN
  Administered 2017-11-10: 1000 mL

## 2017-11-10 MED ORDER — ASPIRIN EC 81 MG PO TBEC
81.0000 mg | DELAYED_RELEASE_TABLET | Freq: Every day | ORAL | Status: DC
  Administered 2017-11-11: 09:00:00 81 mg via ORAL
  Filled 2017-11-10: qty 1

## 2017-11-10 MED ORDER — FENTANYL CITRATE (PF) 100 MCG/2ML IJ SOLN
INTRAMUSCULAR | Status: DC | PRN
  Administered 2017-11-10 (×4): 25 ug via INTRAVENOUS
  Administered 2017-11-10: 50 ug via INTRAVENOUS
  Administered 2017-11-10 (×2): 25 ug via INTRAVENOUS

## 2017-11-10 MED ORDER — CYCLOBENZAPRINE HCL 10 MG PO TABS
10.0000 mg | ORAL_TABLET | Freq: Three times a day (TID) | ORAL | Status: DC | PRN
  Filled 2017-11-10: qty 1

## 2017-11-10 MED ORDER — MIDAZOLAM HCL 2 MG/2ML IJ SOLN
INTRAMUSCULAR | Status: DC | PRN
  Administered 2017-11-10 (×4): 1 mg via INTRAVENOUS

## 2017-11-10 MED ORDER — IOPAMIDOL (ISOVUE-370) INJECTION 76%
INTRAVENOUS | Status: DC | PRN
  Administered 2017-11-10: 305 mL via INTRA_ARTERIAL

## 2017-11-10 MED ORDER — LOSARTAN POTASSIUM 50 MG PO TABS
100.0000 mg | ORAL_TABLET | Freq: Every day | ORAL | Status: DC
  Administered 2017-11-10 – 2017-11-11 (×2): 100 mg via ORAL
  Filled 2017-11-10 (×3): qty 2

## 2017-11-10 MED ORDER — BUPROPION HCL ER (SR) 150 MG PO TB12
150.0000 mg | ORAL_TABLET | Freq: Two times a day (BID) | ORAL | Status: DC
  Administered 2017-11-10 – 2017-11-11 (×2): 150 mg via ORAL
  Filled 2017-11-10 (×4): qty 1

## 2017-11-10 MED ORDER — MIDAZOLAM HCL 2 MG/2ML IJ SOLN
INTRAMUSCULAR | Status: AC
  Filled 2017-11-10: qty 2

## 2017-11-10 MED ORDER — SODIUM CHLORIDE 0.9 % IV SOLN
INTRAVENOUS | Status: DC
  Administered 2017-11-10: 13:00:00 via INTRAVENOUS

## 2017-11-10 MED ORDER — IOPAMIDOL (ISOVUE-370) INJECTION 76%
INTRAVENOUS | Status: AC
  Filled 2017-11-10: qty 100

## 2017-11-10 MED ORDER — NITROGLYCERIN 1 MG/10 ML FOR IR/CATH LAB
INTRA_ARTERIAL | Status: DC | PRN
  Administered 2017-11-10: 200 ug
  Administered 2017-11-10: 200 ug via INTRACORONARY

## 2017-11-10 MED ORDER — HYDRALAZINE HCL 20 MG/ML IJ SOLN: 5.0000 mg | INTRAMUSCULAR | Status: AC | PRN

## 2017-11-10 MED ORDER — LABETALOL HCL 5 MG/ML IV SOLN: 10.0000 mg | INTRAVENOUS | Status: AC | PRN

## 2017-11-10 MED ORDER — FENTANYL CITRATE (PF) 100 MCG/2ML IJ SOLN
INTRAMUSCULAR | Status: AC
  Filled 2017-11-10: qty 2

## 2017-11-10 MED ORDER — ISOSORBIDE MONONITRATE ER 30 MG PO TB24
30.0000 mg | ORAL_TABLET | Freq: Every day | ORAL | Status: DC
  Administered 2017-11-11: 09:00:00 30 mg via ORAL
  Filled 2017-11-10: qty 1

## 2017-11-10 MED ORDER — IOPAMIDOL (ISOVUE-370) INJECTION 76%
INTRAVENOUS | Status: AC
  Filled 2017-11-10: qty 125

## 2017-11-10 MED ORDER — SODIUM CHLORIDE 0.9% FLUSH: 3.0000 mL | INTRAVENOUS | Status: DC | PRN

## 2017-11-10 MED ORDER — PROPRANOLOL HCL 20 MG PO TABS
20.0000 mg | ORAL_TABLET | Freq: Three times a day (TID) | ORAL | Status: DC
  Administered 2017-11-10 – 2017-11-11 (×3): 20 mg via ORAL
  Filled 2017-11-10 (×4): qty 1

## 2017-11-10 MED ORDER — ONDANSETRON HCL 4 MG/2ML IJ SOLN: 4.0000 mg | Freq: Four times a day (QID) | INTRAMUSCULAR | Status: DC | PRN

## 2017-11-10 MED ORDER — HEPARIN SODIUM (PORCINE) 1000 UNIT/ML IJ SOLN
INTRAMUSCULAR | Status: DC | PRN
  Administered 2017-11-10: 6000 [IU] via INTRAVENOUS
  Administered 2017-11-10 (×2): 3000 [IU] via INTRAVENOUS
  Administered 2017-11-10: 6000 [IU] via INTRAVENOUS

## 2017-11-10 MED ORDER — NITROGLYCERIN 0.4 MG SL SUBL: 0.4000 mg | SUBLINGUAL_TABLET | SUBLINGUAL | Status: DC | PRN

## 2017-11-10 MED ORDER — SODIUM CHLORIDE 0.9 % IV SOLN: INTRAVENOUS | Status: AC

## 2017-11-10 MED ORDER — LIDOCAINE HCL (PF) 1 % IJ SOLN
INTRAMUSCULAR | Status: DC | PRN
  Administered 2017-11-10: 2 mL

## 2017-11-10 MED ORDER — HEPARIN SODIUM (PORCINE) 1000 UNIT/ML IJ SOLN
INTRAMUSCULAR | Status: AC
  Filled 2017-11-10: qty 1

## 2017-11-10 MED ORDER — TICAGRELOR 90 MG PO TABS
90.0000 mg | ORAL_TABLET | Freq: Two times a day (BID) | ORAL | Status: DC
  Administered 2017-11-10 – 2017-11-11 (×2): 90 mg via ORAL
  Filled 2017-11-10 (×2): qty 1

## 2017-11-10 MED ORDER — ACETAMINOPHEN 325 MG PO TABS: 650.0000 mg | ORAL_TABLET | ORAL | Status: DC | PRN

## 2017-11-10 MED ORDER — HEPARIN (PORCINE) IN NACL 2-0.9 UNIT/ML-% IJ SOLN
INTRAMUSCULAR | Status: AC
  Filled 2017-11-10: qty 1000

## 2017-11-10 MED ORDER — NITROGLYCERIN 1 MG/10 ML FOR IR/CATH LAB
INTRA_ARTERIAL | Status: AC
  Filled 2017-11-10: qty 10

## 2017-11-10 MED ORDER — SODIUM CHLORIDE 0.9% FLUSH
3.0000 mL | Freq: Two times a day (BID) | INTRAVENOUS | Status: DC
  Administered 2017-11-10 – 2017-11-11 (×2): 3 mL via INTRAVENOUS

## 2017-11-10 MED ORDER — SODIUM CHLORIDE 0.9 % IV SOLN: 250.0000 mL | INTRAVENOUS | Status: DC | PRN

## 2017-11-10 MED ORDER — MORPHINE SULFATE (PF) 4 MG/ML IV SOLN: 2.0000 mg | INTRAVENOUS | Status: DC | PRN

## 2017-11-10 SURGICAL SUPPLY — 27 items
BALLN SAPPHIRE 2.5X15 (BALLOONS) ×2
BALLN SAPPHIRE ~~LOC~~ 3.0X8 (BALLOONS) ×1 IMPLANT
BALLN SAPPHIRE ~~LOC~~ 3.75X15 (BALLOONS) ×1 IMPLANT
BALLN WOLVERINE 3.50X15 (BALLOONS) ×2
BALLOON SAPPHIRE 2.5X15 (BALLOONS) IMPLANT
BALLOON WOLVERINE 3.50X15 (BALLOONS) IMPLANT
CATH INFINITI 5 FR IM (CATHETERS) ×1 IMPLANT
CATH INFINITI 5 FR MPA2 (CATHETERS) ×1 IMPLANT
CATH INFINITI 5FR AL1 (CATHETERS) ×1 IMPLANT
CATH INFINITI 5FR MULTPACK ANG (CATHETERS) ×1 IMPLANT
CATH LAUNCHER 6FR AL.75 (CATHETERS) ×1 IMPLANT
CATH LAUNCHER 6FR EBU3.5 (CATHETERS) ×1 IMPLANT
CATH VISTA GUIDE 6FR MPA1 (CATHETERS) ×1 IMPLANT
COVER PRB 48X5XTLSCP FOLD TPE (BAG) IMPLANT
COVER PROBE 5X48 (BAG) ×2
DEVICE RAD COMP TR BAND LRG (VASCULAR PRODUCTS) ×1 IMPLANT
GLIDESHEATH SLEND A-KIT 6F 22G (SHEATH) ×1 IMPLANT
GUIDEWIRE INQWIRE 1.5J.035X260 (WIRE) IMPLANT
INQWIRE 1.5J .035X260CM (WIRE) ×2
KIT ENCORE 26 ADVANTAGE (KITS) ×1 IMPLANT
KIT HEART LEFT (KITS) ×2 IMPLANT
PACK CARDIAC CATHETERIZATION (CUSTOM PROCEDURE TRAY) ×2 IMPLANT
STENT SYNERGY DES 2.5X20 (Permanent Stent) ×1 IMPLANT
TRANSDUCER W/STOPCOCK (MISCELLANEOUS) ×2 IMPLANT
TUBING CIL FLEX 10 FLL-RA (TUBING) ×2 IMPLANT
WIRE LUGE 182CM (WIRE) ×1 IMPLANT
WIRE MARVEL STR TIP 190CM (WIRE) ×1 IMPLANT

## 2017-11-10 NOTE — Interval H&P Note (Signed)
History and Physical Interval Note:  11/10/2017 2:59 PM  [name]  has presented today for surgery, with the diagnosis of unstable angina, DOE  The various methods of treatment have been discussed with the patient and family. After consideration of risks, benefits and other options for treatment, the patient has consented to  Procedure(s): RIGHT/LEFT HEART CATH AND CORONARY/GRAFT ANGIOGRAPHY (N/A) with POSSIBLE PERCUTANEOUS CORONARY INTERVENTION as a surgical intervention .  The patient's history has been reviewed, patient examined, no change in status, stable for surgery.  I have reviewed the patient's chart and labs.  Questions were answered to the patient's satisfaction.    Cath Lab Visit (complete for each Cath Lab visit)  Clinical Evaluation Leading to the Procedure:   ACS: No.  Non-ACS:    Anginal Classification: CCS IV  Anti-ischemic medical therapy: Maximal Therapy (2 or more classes of medications)  Non-Invasive Test Results: No non-invasive testing performed  Prior CABG: Previous CABG  [name]

## 2017-11-11 ENCOUNTER — Encounter (HOSPITAL_COMMUNITY): Payer: Self-pay | Admitting: Cardiology

## 2017-11-11 DIAGNOSIS — I25110 Atherosclerotic heart disease of native coronary artery with unstable angina pectoris: Secondary | ICD-10-CM | POA: Diagnosis not present

## 2017-11-11 DIAGNOSIS — T82855A Stenosis of coronary artery stent, initial encounter: Secondary | ICD-10-CM | POA: Diagnosis not present

## 2017-11-11 DIAGNOSIS — I200 Unstable angina: Secondary | ICD-10-CM

## 2017-11-11 DIAGNOSIS — I10 Essential (primary) hypertension: Secondary | ICD-10-CM | POA: Diagnosis not present

## 2017-11-11 DIAGNOSIS — I25700 Atherosclerosis of coronary artery bypass graft(s), unspecified, with unstable angina pectoris: Secondary | ICD-10-CM | POA: Diagnosis not present

## 2017-11-11 LAB — CBC
HCT: 42.1 % (ref 39.0–52.0)
HEMOGLOBIN: 13.9 g/dL (ref 13.0–17.0)
MCH: 27.9 pg (ref 26.0–34.0)
MCHC: 33 g/dL (ref 30.0–36.0)
MCV: 84.4 fL (ref 78.0–100.0)
Platelets: 267 10*3/uL (ref 150–400)
RBC: 4.99 MIL/uL (ref 4.22–5.81)
RDW: 13.7 % (ref 11.5–15.5)
WBC: 10.1 10*3/uL (ref 4.0–10.5)

## 2017-11-11 LAB — BASIC METABOLIC PANEL
Anion gap: 10 (ref 5–15)
BUN: 15 mg/dL (ref 6–20)
CALCIUM: 8.9 mg/dL (ref 8.9–10.3)
CO2: 22 mmol/L (ref 22–32)
Chloride: 103 mmol/L (ref 101–111)
Creatinine, Ser: 1.72 mg/dL — ABNORMAL HIGH (ref 0.61–1.24)
GFR calc Af Amer: 48 mL/min — ABNORMAL LOW (ref >60)
GFR, EST NON AFRICAN AMERICAN: 41 mL/min — AB (ref >60)
GLUCOSE: 94 mg/dL (ref 65–99)
Potassium: 3.8 mmol/L (ref 3.5–5.1)
Sodium: 135 mmol/L (ref 135–145)

## 2017-11-11 MED ORDER — NITROGLYCERIN 0.4 MG SL SUBL: SUBLINGUAL_TABLET | SUBLINGUAL | 2 refills | Status: DC

## 2017-11-11 NOTE — Progress Notes (Signed)
Progress Note  Patient Name: [name] Date of Encounter: 11/11/2017  Primary Cardiologist: Ellyn Hack  Subjective   No chest pain or dyspnea this am.   Inpatient Medications    Scheduled Meds: . aspirin EC  81 mg Oral Daily  . buPROPion  150 mg Oral BID  . clonazePAM  2 mg Oral QHS  . isosorbide mononitrate  30 mg Oral Daily  . losartan  100 mg Oral Daily  . propranolol  20 mg Oral TID  . sodium chloride flush  3 mL Intravenous Q12H  . ticagrelor  90 mg Oral BID   Continuous Infusions: . sodium chloride     PRN Meds: sodium chloride, acetaminophen, cyclobenzaprine, HYDROcodone-acetaminophen, morphine injection, nitroGLYCERIN, ondansetron (ZOFRAN) IV, sodium chloride flush   Vital Signs    Vitals:   11/10/17 1830 11/10/17 1859 11/11/17 0432 11/11/17 0827  BP: (!) 161/87 (!) 161/87  123/74  Pulse: 72 75  79  Resp: 19 (!) 25  (!) 25  Temp:  98.2 F (36.8 C) 98.4 F (36.9 C) 97.7 F (36.5 C)  TempSrc:  Oral Oral Oral  SpO2: 100% 99%  98%  Weight:      Height:        Intake/Output Summary (Last 24 hours) at 11/11/2017 0928 Last data filed at 11/11/2017 0829 Gross per 24 hour  Intake 903.33 ml  Output 925 ml  Net -21.67 ml   Filed Weights   11/10/17 1142  Weight: 265 lb (120.2 kg)    Telemetry    sinus - Personally Reviewed  ECG     sinus, 1st degree AV block. Old inf MI- Personally Reviewed  Physical Exam   GEN: No acute distress.   Neck: No JVD Cardiac: RRR, no murmurs, rubs, or gallops.  Respiratory: Clear to auscultation bilaterally. GI: Soft, nontender, non-distended  MS: No edema; No deformity. Left wrist without hematoma Neuro:  Nonfocal  Psych: Normal affect   Labs    Chemistry Recent Labs  Lab 11/09/17 1609 11/10/17 1158 11/11/17 0503  NA 138 136 135  K 4.4 4.1 3.8  CL 100 103 103  CO2 24 22 22   GLUCOSE 90 108* 94  BUN 16 14 15   CREATININE 1.68* 1.66* 1.72*  CALCIUM 9.8 9.1 8.9  PROT 8.0  --   --   ALBUMIN 4.4  --    --   AST 27  --   --   ALT 28  --   --   ALKPHOS 66  --   --   BILITOT 1.1  --   --   GFRNONAA 43* 43* 41*  GFRAA 50* 50* 48*  ANIONGAP  --  11 10     Hematology Recent Labs  Lab 11/09/17 1609 11/11/17 0503  WBC 9.2 10.1  RBC 5.51 4.99  HGB 15.6 13.9  HCT 45.3 42.1  MCV 82 84.4  MCH 28.3 27.9  MCHC 34.4 33.0  RDW 14.1 13.7  PLT 300 267    Cardiac EnzymesNo results for input(s): TROPONINI in the last 168 hours. No results for input(s): TROPIPOC in the last 168 hours.   BNPNo results for input(s): BNP, PROBNP in the last 168 hours.   DDimer No results for input(s): DDIMER in the last 168 hours.   Radiology    No results found.  Cardiac Studies   Cardiac cath 11/10/17:  Ost LAD to Prox LAD lesion is 90% stenosed. Prox LAD lesion is 100% stenosed.  LIMA-LAD graft was visualized by angiography  and is normal in caliber and large. Mid-DISTAL LAD lesion is 70% stenosed. Beyond LIMA insertion  Prox Cx to Mid Cx lesion is 100% stenosed. -At prior stent. Ost 2nd Mrg to 2nd Mrg lesion is 100% stenosed. -At prior stent. Dist Cx lesion is 85% stenosed. -Seen via collateral flow from SVG-RI  SVG-OM2 graft was visualized by angiography. Origin lesion is 100% stenosed.  Prox RCA to Dist RCA lesion is 100% stenosed.  LESION #1  SVG graft-RPDA was visualized by angiography and is large. Origin to Mid Graft STENTED SEGMENT is 70% stenosed. In-stent restenosis  Scoring balloon angioplasty was performed using a BALLOON WOLVERINE 3.50X15. Followed by post dilation using 3.75 mm balloon. Post intervention, there is a 20% residual stenosis.  Mid Graft to Dist Graft lesion is 45% stenosed. -Distal to the stents in the SVG-RPDA  LESION #2  Ost Ramus-1 lesion is 100% stenosed. Ost-PROX Ramus-2 lesion is 80% stenosed.  SVG-RI was injected, large caliber. Insertion lesion is 80% stenosed. -- CONTINUES -- Ramus lesion is 90% stenosed.  A drug-eluting stent was successfully placed  using a STENT SYNERGY DES 2.5X20 -coursing from the SVG into the Ramus superior branch.  Post intervention, there is a 0% residual stenosis.   Severe progression of anastomotic disease in SVG-RI, severe disease in the superior branch of RI. Successful scoring balloon angioplasty of SVG-RPDA ISR, DES PCI of anastomotic SVG-RI into the superior branch of RI.  Plan:   Overnight observation with IV hydration.  And his be discharged tomorrow. Continue Imdur, and increased dose of propranolol. We will reassess for recurrence of angina post PCI.  Plan will be for minimum 3 months of uninterrupted dual antiplatelet therapy.  After which time we can stop aspirin, and can consider bridging Brilinta to allow for for back surgery.  Patient Profile     62 y.o. male with CAD s/p CABG admitted after cardiac cath/PCI. Recent unstable angina.   Assessment & Plan    1. CAD s/p CABG with unstable angina: Pt admitted following cardiac cath 11/10/17. PCI performed of SVG to ramus with stenting and SVG to PDA with scoring balloon angioplasty. Doing well this am. Will d/c home today with aSA and Brilinta. Continue beta blocker. He has been on Repatha and is statin intolerant.   For questions or updates, please contact [place] Please consult [email or web address] for contact info under Cardiology/STEMI.      Signed, Lauree Chandler, MD  11/11/2017, 9:28 AM

## 2017-11-11 NOTE — Care Management Note (Signed)
Case Management Note  Patient Details  Name: [name] MRN: [id number] Date of Birth: January 08, 1956  Subjective/Objective:    From home, pta indep, s/p coronary stent intervention.  Has been on brilinta pta.                  Action/Plan: NCM will follow for dc needs.   Expected Discharge Date:                  Expected Discharge Plan:  Home/Self Care  In-House Referral:     Discharge planning Services  CM Consult  Post Acute Care Choice:    Choice offered to:     DME Arranged:    DME Agency:     HH Arranged:    [place] Agency:     Status of Service:  Completed, signed off  If discussed at [employer] of Stay Meetings, dates discussed:    Additional Comments:  Zenon Mayo, RN 11/11/2017, 10:33 AM

## 2017-11-11 NOTE — Discharge Summary (Signed)
Discharge Summary    Patient ID: NOHLAN BURDIN,  MRN: [id number], DOB/AGE: 1956-01-11 62 y.o.  Admit date: 11/10/2017 Discharge date: 11/11/2017  Primary Care Provider: Meryle Ready Primary Cardiologist: Ellyn Hack  Discharge Diagnoses    Principal Problem:   Unstable angina [facility]) Active Problems:   HTN (hypertension)   Dyslipidemia, with intolerance to Crestor and zocor and possible cough with Lipitor   DOE (dyspnea on exertion), anginal equivalant   CAD-CABG 2002 (LIMA-LAD, VG-ramus intermediate, SVG-OM, SVG-PDA) s/p Cath 11/29/12-total occulsion of SVG-OM, EF of 45%   Allergies Allergies  Allergen Reactions  . Crestor [Rosuvastatin] Other (See Comments)    REACTION: Muscle aches  . Lipitor [Atorvastatin] Cough    ? cough  . Lisinopril Cough    CHANGE TO LOSARTAN  . Zocor [Simvastatin] Other (See Comments)    REACTION: Muscle aches    Diagnostic Studies/Procedures    Cath: 11/10/17  Conclusion     Ost LAD to Prox LAD lesion is 90% stenosed. Prox LAD lesion is 100% stenosed.  LIMA-LAD graft was visualized by angiography and is normal in caliber and large. Mid-DISTAL LAD lesion is 70% stenosed. Beyond LIMA insertion  Prox Cx to Mid Cx lesion is 100% stenosed. -At prior stent. Ost 2nd Mrg to 2nd Mrg lesion is 100% stenosed. -At prior stent. Dist Cx lesion is 85% stenosed. -Seen via collateral flow from SVG-RI  SVG-OM2 graft was visualized by angiography. Origin lesion is 100% stenosed.  Prox RCA to Dist RCA lesion is 100% stenosed.  LESION #1  SVG graft-RPDA was visualized by angiography and is large. Origin to Mid Graft STENTED SEGMENT is 70% stenosed. In-stent restenosis  Scoring balloon angioplasty was performed using a BALLOON WOLVERINE 3.50X15. Followed by post dilation using 3.75 mm balloon. Post intervention, there is a 20% residual stenosis.  Mid Graft to Dist Graft lesion is 45% stenosed. -Distal to the stents in the SVG-RPDA  LESION  #2  Ost Ramus-1 lesion is 100% stenosed. Ost-PROX Ramus-2 lesion is 80% stenosed.  SVG-RI was injected, large caliber. Insertion lesion is 80% stenosed. -- CONTINUES -- Ramus lesion is 90% stenosed.  A drug-eluting stent was successfully placed using a STENT SYNERGY DES 2.5X20 -coursing from the SVG into the Ramus superior branch.  Post intervention, there is a 0% residual stenosis.   Severe progression of anastomotic disease in SVG-RI, severe disease in the superior branch of RI. Successful scoring balloon angioplasty of SVG-RPDA ISR, DES PCI of anastomotic SVG-RI into the superior branch of RI.  Plan:   Overnight observation with IV hydration.  And his be discharged tomorrow. Continue Imdur, and increased dose of propranolol. We will reassess for recurrence of angina post PCI.  Plan will be for minimum 3 months of uninterrupted dual antiplatelet therapy.  After which time we can stop aspirin, and can consider bridging Brilinta to allow for for back surgery.  He will follow-up with me as scheduled   _____________   History of Present Illness     62 yo male with PMH of CAD s/p CABG, HTN, HL, OSA on Cpap who was seen in the office on 11/09/17 with ongoing symptoms of unstable angina. Seen by Dr. Ellyn Hack who planned for outpatient cardiac cath.   Hospital Course     Underwent cardiac cath noted above with PCI performed of SVG to ramus with stenting and SVG to PDA with scoring balloon angioplasty. Plan to continue DAPT with ASA/Brilinta. No adjustments in home medications. Worked well with cardiac rehab,  with no recurrent chest pain. Morning labs were stable. On Repatha as outpatient already.   Jaymes Graff Prowell was seen by Dr. Angelena Form and determined stable for discharge home. Follow up in the office has been arranged. Medications are listed below.   _____________  Discharge Vitals Blood pressure (!) 177/92, pulse 77, temperature 98.5 F (36.9 C), temperature source Oral, resp.  rate 14, height 5\' 10"  (1.778 m), weight 265 lb (120.2 kg), SpO2 96 %.  Filed Weights   11/10/17 1142  Weight: 265 lb (120.2 kg)    Labs & Radiologic Studies    CBC Recent Labs    11/09/17 1609 11/11/17 0503  WBC 9.2 10.1  HGB 15.6 13.9  HCT 45.3 42.1  MCV 82 84.4  PLT 300 789   Basic Metabolic Panel Recent Labs    11/10/17 1158 11/11/17 0503  NA 136 135  K 4.1 3.8  CL 103 103  CO2 22 22  GLUCOSE 108* 94  BUN 14 15  CREATININE 1.66* 1.72*  CALCIUM 9.1 8.9   Liver Function Tests Recent Labs    11/09/17 1609  AST 27  ALT 28  ALKPHOS 66  BILITOT 1.1  PROT 8.0  ALBUMIN 4.4   No results for input(s): LIPASE, AMYLASE in the last 72 hours. Cardiac Enzymes No results for input(s): CKTOTAL, CKMB, CKMBINDEX, TROPONINI in the last 72 hours. BNP Invalid input(s): POCBNP D-Dimer No results for input(s): DDIMER in the last 72 hours. Hemoglobin A1C No results for input(s): HGBA1C in the last 72 hours. Fasting Lipid Panel Recent Labs    11/09/17 1609  CHOL 227*  HDL 32*  LDLCALC 133*  TRIG 310*  CHOLHDL 7.1*   Thyroid Function Tests No results for input(s): TSH, T4TOTAL, T3FREE, THYROIDAB in the last 72 hours.  Invalid input(s): FREET3 _____________  Dg Lumbar Spine 2-3 Views  Result Date: 11/07/2017 CLINICAL DATA:  Lumbar spondylosis. Status post PLIF from L3-4 through L5-S1. Increasing right lower back pain and numbness in the right knee and lower leg when lying on the right side. EXAM: LUMBAR SPINE - 2-3 VIEW COMPARISON:  CT scan of the lumbar spine dated November 07, 2017 FINDINGS: The lumbar vertebral bodies are preserved in height. There is no spondylolisthesis. The metallic hardware from L3 through S1 appears intact and the inter discal device markers appear appropriately positioned. There is mild disc space narrowing at L2-3. The L1-2 disc space is well-maintained. IMPRESSION: Post PLIF changes at L3-4, L4-5, and L5-S1 with no evidence of hardware failure  or displacement. Mild disc space narrowing at L2-3. No compression fracture or spondylolisthesis. Electronically Signed   By: David  Martinique M.D.   On: 11/07/2017 08:58   Ct Lumbar Spine Wo Contrast  Result Date: 11/07/2017 CLINICAL DATA:  Lumbar spondylosis. Right leg pain and right knee numbness. Prior lumbar fusion surgeries. EXAM: CT LUMBAR SPINE WITHOUT CONTRAST TECHNIQUE: Multidetector CT imaging of the lumbar spine was performed without intravenous contrast administration. Multiplanar CT image reconstructions were also generated. COMPARISON:  06/06/2014 FINDINGS: Segmentation: 5 lumbar type vertebrae. Alignment: Unchanged alignment.  No listhesis. Vertebrae: Preserved vertebral body heights. No fracture or suspicious osseous lesion. Sequelae of L3-S1 fusion are again identified with interbody implants and pedicle screws remaining in place at each level. Minimal lucency surrounding the right S1 screw is unchanged. Solid interbody osseous fusion is again seen at L3-4 and L4-5. There is also now clearly some solid interbody osseous fusion at L5-S1 which has developed in the interim. There is also now  solid posterolateral osseous fusion at L5-S1, more robust on the left. Small upper lumbar and lower thoracic Schmorl's nodes are unchanged. Paraspinal and other soft tissues: Abdominal aortic atherosclerosis without aneurysm. Disc levels: T12-L1: Unchanged ligamentum flavum thickening and calcification and asymmetric right facet hypertrophy. No stenosis. L1-2: Unchanged ligamentum flavum thickening and calcification and facet hypertrophy. No stenosis. L2-3: Circumferential disc bulging, prominent ligamentum flavum thickening and calcification, and moderate facet hypertrophy result in mild spinal stenosis and mild bilateral neural foraminal stenosis, unchanged. L3-4: Prior posterior decompression and fusion. No evidence of significant stenosis. L4-5: Prior posterior decompression and fusion. No evidence of  significant stenosis. L5-S1: Prior posterior decompression and fusion. Endplate spurring results in mild left neural foraminal stenosis, unchanged. No evidence of significant spinal stenosis. IMPRESSION: 1. New solid osseous fusion at L5-S1. Unchanged mild osseous left neural foraminal stenosis. 2. Solid L3-4 and L4-5 fusion without evidence of significant stenosis. 3. Unchanged mild multifactorial spinal and neural foraminal stenosis at L2-3. 4.  Aortic Atherosclerosis (ICD10-I70.0). Electronically Signed   By: Logan Bores M.D.   On: 11/07/2017 09:39   Disposition   Pt is being discharged home today in good condition.  Follow-up Plans & Appointments    Follow-up Information    Leonie Man, MD Follow up on 11/24/2017.   Specialty:  Cardiology Why:  at 2:40pm for your follow up appt.  Contact information: [address] [address] [phone]          Discharge Instructions    Amb Referral to Cardiac Rehabilitation   Complete by:  As directed    To [place]   Diagnosis:   Coronary Stents PTCA     Diet - low sodium heart healthy   Complete by:  As directed    Discharge instructions   Complete by:  As directed    Radial Site Care Refer to this sheet in the next few weeks. These instructions provide you with information on caring for yourself after your procedure. Your caregiver may also give you more specific instructions. Your treatment has been planned according to current medical practices, but problems sometimes occur. Call your caregiver if you have any problems or questions after your procedure. HOME CARE INSTRUCTIONS You may shower the day after the procedure.Remove the bandage (dressing) and gently wash the site with plain soap and water.Gently pat the site dry.  Do not apply powder or lotion to the site.  Do not submerge the affected site in water for 3 to 5 days.  Inspect the site at least twice daily.  Do not flex or bend the affected  arm for 24 hours.  No lifting over 5 pounds (2.3 kg) for 5 days after your procedure.  Do not drive home if you are discharged the same day of the procedure. Have someone else drive you.  You may drive 24 hours after the procedure unless otherwise instructed by your caregiver.  What to expect: Any bruising will usually fade within 1 to 2 weeks.  Blood that collects in the tissue (hematoma) may be painful to the touch. It should usually decrease in size and tenderness within 1 to 2 weeks.  SEEK IMMEDIATE MEDICAL CARE IF: You have unusual pain at the radial site.  You have redness, warmth, swelling, or pain at the radial site.  You have drainage (other than a small amount of blood on the dressing).  You have chills.  You have a fever or persistent symptoms for more than 72 hours.  You have a fever and your symptoms suddenly get worse.  Your arm becomes pale, cool, tingly, or numb.  You have heavy bleeding from the site. Hold pressure on the site.   PLEASE DO NOT MISS ANY DOSES OF YOUR BRILINTA!!!!! Also keep a log of you blood pressures and bring back to your follow up appt. Please call the office with any questions.   Patients taking blood thinners should generally stay away from medicines like ibuprofen, Advil, Motrin, naproxen, and Aleve due to risk of stomach bleeding. You may take Tylenol as directed or talk to your primary doctor about alternatives.   Increase activity slowly   Complete by:  As directed       Discharge Medications     Medication List    TAKE these medications   aspirin EC 81 MG tablet Take 1 tablet (81 mg total) by mouth daily.   BRILINTA 90 MG Tabs tablet Generic drug:  ticagrelor TAKE 1 TABLET BY MOUTH TWICE A DAY   buPROPion 150 MG 12 hr tablet Commonly known as:  WELLBUTRIN SR Take 150 mg by mouth 2 (two) times daily.   chlorthalidone 25 MG tablet Commonly known as:  HYGROTON Take 1 tablet (25 mg total) by mouth daily.   clonazePAM 2 MG  tablet Commonly known as:  KLONOPIN Take 1 tablet (2 mg total) by mouth at bedtime.   cyclobenzaprine 10 MG tablet Commonly known as:  FLEXERIL Take 10 mg by mouth 3 (three) times daily as needed for muscle spasms.   HYDROcodone-acetaminophen 5-325 MG tablet Commonly known as:  NORCO/VICODIN Take 1 tablet by mouth every 6 (six) hours as needed for moderate pain.   isosorbide mononitrate 30 MG 24 hr tablet Commonly known as:  IMDUR Take 1 tablet (30 mg total) by mouth daily. Take Aspirin 81 mg 30 minutes prior to this dose each day.   losartan 100 MG tablet Commonly known as:  COZAAR Take 100 mg by mouth daily.   nitroGLYCERIN 0.4 MG SL tablet Commonly known as:  NITROSTAT PLACE 1TAB UNDER TONGUE EVERY 5MIN FOR 3DOSES AS NEEDED FOR CHEST PAIN What changed:  medication strength   propranolol 20 MG tablet Commonly known as:  INDERAL Take 1 tablet (20 mg total) by mouth 3 (three) times daily.   REPATHA SURECLICK 354 MG/ML [place] Generic drug:  Evolocumab INJECT 140 MG INTO THE SKIN EVERY 14 DAYS.        Aspirin prescribed at discharge?  Yes High Intensity Statin Prescribed? (Lipitor 40-80mg  or Crestor 20-40mg ): No: on Repatha Beta Blocker Prescribed? Yes For EF <40%, was ACEI/ARB Prescribed? Yes ADP Receptor Inhibitor Prescribed? (i.e. Plavix etc.-Includes Medically Managed Patients): Yes For EF <40%, Aldosterone Inhibitor Prescribed? No: EF ok Was EF assessed during THIS hospitalization? Yes Was Cardiac Rehab II ordered? (Included Medically managed Patients): Yes   Outstanding Labs/Studies   N/a   Duration of Discharge Encounter   Greater than 30 minutes including physician time.  Signed, Nicoli Nardozzi NP-C 11/11/2017, 12:42 PM

## 2017-11-11 NOTE — Progress Notes (Signed)
CARDIAC REHAB PHASE I   PRE:  Rate/Rhythm: 79 SR    BP: sitting 129/82    SaO2:   MODE:  Ambulation: 260 ft   POST:  Rate/Rhythm: 90 SR    BP: sitting 160/94     SaO2:   Not SOB walking, which is greatly improved. He is limited by his back/leg issues. Ed completed/reviewed with pt and wife. Understands need for Brilinta/ASA. Discussed decreasing sugars due to triglycerides. Encouraged ex as tolerated but he is quite limited by his back issues. Will refer to [facility] in [place] but will not be able to do until after his back surgery. He needs new rx for NTG. [id number]   Darrick Meigs CES, ACSM 11/11/2017 9:58 AM

## 2017-11-12 ENCOUNTER — Encounter (HOSPITAL_COMMUNITY): Payer: Self-pay | Admitting: Cardiology

## 2017-11-15 ENCOUNTER — Telehealth: Payer: Self-pay | Admitting: Cardiology

## 2017-11-15 NOTE — Telephone Encounter (Signed)
Returned call to patient of Dr. Ellyn Hack. He has a headache lasting about 30 minutes d/t to imdur but main concern is that he has a bad cough since starting Imdur on Jan 30.Marland Kitchen He has been on Brilinta for 2 years. He states his feels pressure in his throat, like an allergy. He plans to hold imdur today until he hears back from the office. Will route to clinical pharmacists to review for potential SE.   He also states he is out of Repatha for 3 months - will send message to pharmacist about this too.

## 2017-11-15 NOTE — Telephone Encounter (Signed)
OK to stop Imdur if no further ANgina & NTG use.  Ricardo Hew, MD

## 2017-11-15 NOTE — Telephone Encounter (Signed)
New message   Pt c/o medication issue:  1. Name of Medication: isosorbide mononitrate (IMDUR) 30 MG 24 hr tablet  2. How are you currently taking this medication (dosage and times per day)? Take 1 tablet (30 mg total) by mouth daily. Take Aspirin 81 mg 30 minutes prior to this dose each day.  3. Are you having a reaction (difficulty breathing--STAT)? yes  4. What is your medication issue? Pt says that the medication is giving him a bad headache and really bad cough States that he was only suppose to be on the medication until he had Cath and that has been done. Please call

## 2017-11-16 ENCOUNTER — Inpatient Hospital Stay
Admission: RE | Admit: 2017-11-16 | Discharge: 2017-11-16 | Disposition: A | Discharge status: Home / Self Care | Payer: Medicare Other | Source: Ambulatory Visit | Attending: Nurse Practitioner | Admitting: Nurse Practitioner

## 2017-11-16 NOTE — Telephone Encounter (Signed)
Follow Up Call:  [name] is calling to find out how long will it take for the imdur to get out of his system. Please call

## 2017-11-16 NOTE — Telephone Encounter (Signed)
Spoke to patient . Can stop imdur per DR HARDING.MAY USE NTG PRN. REPORT OF LABS GIVEN - CHOLESTEROL AND OTHERS.

## 2017-11-24 ENCOUNTER — Encounter: Payer: Self-pay | Admitting: Cardiology

## 2017-11-24 ENCOUNTER — Ambulatory Visit: Payer: Medicare Other | Admitting: Cardiology

## 2017-11-24 VITALS — BP 152/94 | HR 72 | Ht 70.0 in | Wt 276.6 lb

## 2017-11-24 DIAGNOSIS — I2510 Atherosclerotic heart disease of native coronary artery without angina pectoris: Secondary | ICD-10-CM | POA: Diagnosis not present

## 2017-11-24 DIAGNOSIS — Z9861 Coronary angioplasty status: Secondary | ICD-10-CM

## 2017-11-24 DIAGNOSIS — E785 Hyperlipidemia, unspecified: Secondary | ICD-10-CM

## 2017-11-24 DIAGNOSIS — Z950 Presence of cardiac pacemaker: Secondary | ICD-10-CM | POA: Diagnosis not present

## 2017-11-24 DIAGNOSIS — G250 Essential tremor: Secondary | ICD-10-CM

## 2017-11-24 DIAGNOSIS — R0609 Other forms of dyspnea: Secondary | ICD-10-CM

## 2017-11-24 DIAGNOSIS — I10 Essential (primary) hypertension: Secondary | ICD-10-CM

## 2017-11-24 DIAGNOSIS — I200 Unstable angina: Secondary | ICD-10-CM | POA: Diagnosis not present

## 2017-11-24 DIAGNOSIS — I25700 Atherosclerosis of coronary artery bypass graft(s), unspecified, with unstable angina pectoris: Secondary | ICD-10-CM | POA: Diagnosis not present

## 2017-11-24 DIAGNOSIS — E669 Obesity, unspecified: Secondary | ICD-10-CM
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
## 2017-11-24 NOTE — Patient Instructions (Signed)
NO CHANGE WITH MEDICATION  TAKE PROPRANOLOL 20 MG THREE TIMES A DAY.   Your physician recommends that you schedule a follow-up appointment in -[facility] - BLOOD PRESSURE , CHOLESTEROL   Your physician recommends that you schedule a follow-up appointment in [place] UPCOMING  SURGERY

## 2017-11-25 ENCOUNTER — Other Ambulatory Visit: Payer: Self-pay | Admitting: Cardiology

## 2017-11-25 NOTE — Telephone Encounter (Signed)
Rx has been sent to the pharmacy electronically. ° °

## 2017-11-27 ENCOUNTER — Encounter: Payer: Self-pay | Admitting: Cardiology

## 2017-11-27 NOTE — Assessment & Plan Note (Signed)
Titrate propranolol up to 20 mg 3 times daily.  Next option will be to convert from Cozaar to more potent ARB versus adding amlodipine

## 2017-11-27 NOTE — Assessment & Plan Note (Signed)
Wonderful results from most recent PTCA and PCI.  No further anginal symptoms.  He is on Brilinta and aspirin along with beta-blocker and ARB.  He will need to be on aspirin plus Brilinta for 3 months.  At that point, we can reassess to determine if he is having any more symptoms.  If necessary we can consider suspending Brilinta for 5-7 days to allow for back surgery and then restart.  Data on the new drug-eluting stents would indicate that this should be a safe course of action.

## 2017-11-27 NOTE — Assessment & Plan Note (Signed)
Blood pressure is higher than expected.  Plan was for him to be on 20 mg 3 times daily propranolol which we will go ahead and have him do.  Apparently he is only been taking the 10 mg doses because of confusion post cath. Next step would probably be converting from Cozaar to a more potent ARB versus adding amlodipine.  Need to reassess in 3 months. Continue chlorthalidone.

## 2017-11-27 NOTE — Assessment & Plan Note (Signed)
No further symptoms post PCI. Did not do right heart catheterization based on the left heart cath findings.

## 2017-11-27 NOTE — Assessment & Plan Note (Signed)
Significant native vessel CAD with now occluded RCA, circumflex and LAD.  Graft dependent: LIMA-LAD patent with distal LAD disease.  SVG-RI now with stent at the anastomosis into the native vessel and extensive stents in the SVG-RCA with ISR treated with PTCA.  Occluded SVG-OM, but now with collateral flow from RI to the OM and distal circumflex vessels.  The new finding of CTO/ISR of circumflex remains his major issue -> in the absence of symptoms, would not consider CTO PCI.  Needs to be back on his PCSK9 inhibitor. Remain on aspirin and Brilinta for at least 3 months and then can potentially hold for surgery. Continue beta-blocker and ARB.

## 2017-11-27 NOTE — Assessment & Plan Note (Signed)
Had been doing very well with with Repatha.  We are working now on try to get this approved through [employer].

## 2017-11-27 NOTE — Assessment & Plan Note (Signed)
Pacemaker in place.  Functioning properly.  Had been followed by his previous EP doctor, but will probably end up having him followed up here.

## 2017-11-27 NOTE — Assessment & Plan Note (Signed)
Although not classically used for CAD, he is on propranolol for his tremor.  We will simply try to use propranolol as best as possible but low threshold for converting to a different beta-blocker.

## 2017-12-07 ENCOUNTER — Other Ambulatory Visit: Payer: Self-pay | Admitting: Pharmacist Clinician (PhC)/ Clinical Pharmacy Specialist

## 2017-12-07 MED ORDER — EVOLOCUMAB 140 MG/ML ~~LOC~~ SOAJ: 140.0000 mg | SUBCUTANEOUS | 4 refills | Status: DC

## 2017-12-14 ENCOUNTER — Ambulatory Visit: Payer: Medicare Other

## 2017-12-26 ENCOUNTER — Ambulatory Visit: Payer: Medicare Other

## 2017-12-26 NOTE — Progress Notes (Deleted)
Patient ID: [name]                 DOB: [birth year]                      MRN: [id number]     HPI: [name] is a 62 y.o. male referred by Dr. Ellyn Hack to HTN clinic.  Current HTN meds:  Chlorthalidone 25mg  every morning Losartan 100mg  every evening Propranolol 20mg  three times a day  Current Cholesterol medication: Repatha 140mg  Rosalia every 14 days  Previously tried:   BP goal: 130/80  Family History:   Social History:   Diet:   Exercise:   Home BP readings:   Wt Readings from Last 3 Encounters:  11/24/17 276 lb 9.6 oz (125.5 kg)  11/10/17 265 lb (120.2 kg)  11/09/17 277 lb 6.4 oz (125.8 kg)   BP Readings from Last 3 Encounters:  11/24/17 (!) 152/94  11/11/17 (!) 177/92  11/09/17 (!) 147/93   Pulse Readings from Last 3 Encounters:  11/24/17 72  11/11/17 77  11/09/17 72    Renal function: CrCl cannot be calculated (Patient's most recent lab result is older than the maximum 21 days allowed.).  Past Medical History:  Diagnosis Date  . Anginal pain ([place])   . Arthritis    "left shoulder; back" (01/26/2016)  . Bradycardia by electrocardiogram 2006   Medtronic PPM  . CAD (coronary artery disease) of artery bypass graft, with stent to VG-OM-(BMS) 2017   a. 06/29/16 PCI -DES x2 to SVG-RPDA (ostial SVG-RCA was for ISR), SVG-OM1 stent 100%. Patent LIMA-LAD & SVG-RI .  Native Cx - extensive stents with occluded OM1 and OM 2. 10/2017:  pLAD 100% (patent LIMA - dLAD 70%). NEW: p-mCx 100% @ prior stent (now Cx & OM1/OM2 occluded - fill via collatrals from RI). SVG-rPDA  80% ISR (PTCA), anastomotic SVG-RI 80% with 80% into Diag branch of RI (DES PCI)  . CAD (coronary artery disease), with CABG LIMA-LAD, VG-ramus intermediate, SVG-OM, SVG-PDA 2002   Cath September 2017: patent LIMA-LAD, patent SVG-RI, occluded SVG-OM1 with patent stents in LCx and RI.  Severe ISR in SVG-RCA with severe mid disease.  Occluded OM1, OM 2, mid LAD and mid RCA.  Distal LAD 70%, distal  LCx --2 overlapping DES stents to SVG-RCA;; 11/10/17: ISR of SVG-RCA  (~70%). 80% distal SVG-RI with RI lesion. CTO of LCx & OM stents -> PCI SVG-RI into RI & PTCA of SVG-RCA  . Chronic lower back pain    . Depression   . Dyslipidemia, with intolerance to Crestor and zocor and possible cough with Lipitor    . History of blood transfusion 04/2001   "when I had bypass OR"  . HTN (hypertension)    . Myocardial infarction [facility])    hx 6 heart attacks --last 2 were in April 2014 and then September 2017  . Obesity (BMI 30-39.9)   . OSA on CPAP    . Shortness of breath dyspnea    unable to climb a set of stairs  . Tremor, essential, treated with propranolol      Current Outpatient Medications on File Prior to Visit  Medication Sig Dispense Refill  . aspirin EC 81 MG tablet Take 1 tablet (81 mg total) by mouth daily. 90 tablet 3  . BRILINTA 90 MG TABS tablet TAKE 1 TABLET BY MOUTH TWICE A DAY 180 tablet 2  . buPROPion (WELLBUTRIN SR) 150 MG 12 hr tablet Take 150 mg  by mouth 2 (two) times daily.    . chlorthalidone (HYGROTON) 25 MG tablet Take 1 tablet (25 mg total) by mouth daily. 90 tablet 3  . clonazePAM (KLONOPIN) 2 MG tablet Take 1 tablet (2 mg total) by mouth at bedtime. 30 tablet 0  . cyclobenzaprine (FLEXERIL) 10 MG tablet Take 10 mg by mouth 3 (three) times daily as needed for muscle spasms.     . Evolocumab (REPATHA SURECLICK) 212 MG/ML SOAJ Inject 140 mg into the skin every 14 (fourteen) days. 6 pen 4  . HYDROcodone-acetaminophen (NORCO) 7.5-325 MG tablet     . losartan (COZAAR) 100 MG tablet Take 100 mg by mouth daily.    Marland Kitchen losartan (COZAAR) 100 MG tablet TAKE 1 TABLET BY MOUTH EVERY DAY 90 tablet 3  . nitroGLYCERIN (NITROSTAT) 0.4 MG SL tablet PLACE 1TAB UNDER TONGUE EVERY 5MIN FOR 3DOSES AS NEEDED FOR CHEST PAIN 25 tablet 2  . propranolol (INDERAL) 20 MG tablet Take 1 tablet (20 mg total) by mouth 3 (three) times daily. 270 tablet 3   No current facility-administered medications on  file prior to visit.     Allergies  Allergen Reactions  . Crestor [Rosuvastatin] Other (See Comments)    REACTION: Muscle aches  . Lipitor [Atorvastatin] Cough    ? cough  . Lisinopril Cough    CHANGE TO LOSARTAN  . Zocor [Simvastatin] Other (See Comments)    REACTION: Muscle aches    There were no vitals taken for this visit.  No problem-specific Assessment & Plan notes found for this encounter.   Caitlan Chauca Rodriguez-Guzman PharmD, BCPS, CPP [place] [address] 12/26/2017 9:27 AM

## 2017-12-28 ENCOUNTER — Other Ambulatory Visit: Payer: Self-pay | Admitting: Cardiology

## 2017-12-29 ENCOUNTER — Ambulatory Visit: Payer: Medicare Other | Admitting: Pharmacist

## 2017-12-29 VITALS — BP 138/90 | HR 58

## 2017-12-29 DIAGNOSIS — I10 Essential (primary) hypertension: Secondary | ICD-10-CM

## 2017-12-29 NOTE — Progress Notes (Signed)
Patient ID: [name]                 DOB: 12/20/1955                      MRN: [id number]     HPI: [name] is a 62 y.o. male referred by Dr. Ellyn Hack to HTN clinic. PMH below who presents today for hypertension clinic follow up.  His cardiac history is significant for a NSTEMI in Sept 2017, with DES to LCX.  Prior to that, in 2002, he had CABG x 4.  He had been intolerant of statin medications and is now on Repatha 140 mg sq every 14 days.  He also has hypertension, OSA on CPAP, obesity and essential tremor, treated with propranolol.  His propranolol prescription was recently changed to 20mg  TID.  Blood pressure is looking better at home and patient is trying to go back to normal activities. Started using treadmill at home and is cooking at weekends events too. Today he is feeling well, no CP, SOB, dizziness or edema.   Blood Pressure Goal:  130/80  Current Medications:             Losartan 100 mg every eveneing             Propranolol 20mg  three times daily   Chlorthalidone 25mg  every morning  Family History:             Father had hypertension and hyperlipidemia             Mother had a heart murmur  Social History:             Never smoked, rare alcohol, sweet tea throughout day, daily coffee  Diet:             Mostly home cooked meals; no deep fried; more red meat lately; some added salt; steamed frozen veggies (not canned).  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 Anginal pain ([place])   . Arthritis    "left shoulder; back" (01/26/2016)  . Bradycardia by electrocardiogram 2006   Medtronic PPM  . CAD (coronary artery disease) of artery bypass graft, with stent to VG-OM-(BMS) 2017   a. 06/29/16 PCI -DES x2 to SVG-RPDA (ostial SVG-RCA was for ISR), SVG-OM1 stent 100%. Patent LIMA-LAD & SVG-RI .  Native Cx - extensive stents with occluded OM1 and OM 2. 10/2017:  pLAD 100% (patent LIMA - dLAD 70%). NEW: p-mCx 100% @ prior stent (now Cx & OM1/OM2 occluded - fill via collatrals from RI). SVG-rPDA  80% ISR (PTCA), anastomotic SVG-RI 80% with 80% into Diag branch of RI (DES PCI)  . CAD (coronary artery  disease), with CABG LIMA-LAD, VG-ramus intermediate, SVG-OM, SVG-PDA 2002   Cath September 2017: patent LIMA-LAD, patent SVG-RI, occluded SVG-OM1 with patent stents in LCx and RI.  Severe ISR in SVG-RCA with severe mid disease.  Occluded OM1, OM 2, mid LAD and mid RCA.  Distal LAD 70%, distal LCx --2 overlapping DES stents to SVG-RCA;; 11/10/17: ISR of SVG-RCA  (~70%). 80% distal SVG-RI with RI lesion. CTO of LCx & OM stents -> PCI SVG-RI into RI & PTCA of SVG-RCA  . Chronic lower back pain    . Depression   . Dyslipidemia, with intolerance to Crestor and zocor and possible cough with Lipitor    . History of blood transfusion 04/2001   "when I had bypass OR"  . HTN (hypertension)    . Myocardial infarction [facility])    hx 6 heart attacks --last 2 were in April 2014 and then September 2017  . Obesity (BMI 30-39.9)   . OSA on CPAP    . Shortness of breath dyspnea    unable to climb a set of stairs  . Tremor, essential, treated with propranolol      Current Outpatient Medications on File Prior to Visit  Medication Sig Dispense Refill  . aspirin EC 81 MG tablet Take 1 tablet (81 mg total) by mouth daily. 90 tablet 3  . BRILINTA 90 MG TABS tablet TAKE 1 TABLET BY  MOUTH TWICE A DAY 180 tablet 2  . buPROPion (WELLBUTRIN SR) 150 MG 12 hr tablet Take 150 mg by mouth 2 (two) times daily.    . chlorthalidone (HYGROTON) 25 MG tablet TAKE 1 TABLET (25 MG TOTAL) BY MOUTH DAILY. 90 tablet 3  . clonazePAM (KLONOPIN) 2 MG tablet Take 1 tablet (2 mg total) by mouth at bedtime. 30 tablet 0  . Evolocumab (REPATHA SURECLICK) 833 MG/ML SOAJ Inject 140 mg into the skin every 14 (fourteen) days. 6 pen 4  . losartan (COZAAR) 100 MG tablet TAKE 1 TABLET BY MOUTH EVERY DAY 90 tablet 3  . predniSONE (DELTASONE) 20 MG tablet Take 20 mg by mouth daily with breakfast.    . propranolol (INDERAL) 20 MG tablet Take 1 tablet (20 mg total) by mouth 3 (three) times daily. 270 tablet 3  . cyclobenzaprine (FLEXERIL) 10 MG tablet Take 10 mg by mouth 3 (three) times daily as needed for muscle spasms.     Marland Kitchen HYDROcodone-acetaminophen (NORCO) 7.5-325 MG tablet     . nitroGLYCERIN (NITROSTAT) 0.4 MG SL tablet PLACE 1TAB UNDER TONGUE EVERY 5MIN FOR 3DOSES AS NEEDED FOR CHEST PAIN (Patient not taking: Reported on 12/29/2017) 25 tablet 2   No current facility-administered medications on file prior to visit.     Allergies  Allergen Reactions  . Crestor [Rosuvastatin] Other (See Comments)    REACTION: Muscle aches  . Lipitor [Atorvastatin] Cough    ? cough  . Lisinopril Cough    CHANGE TO LOSARTAN  . Zocor [Simvastatin] Other (See Comments)    REACTION: Muscle aches    Blood pressure 138/90, pulse (!) 58, SpO2 99 %.  Essential hypertension Blood pressure today significantly improved. Patient denies problems with current therapy or any adverse events.  We don't have home BP readings for assessment but clinic reading has improved almost 20 mmHg since last office visit. Will continue current medication without changes and patient was encouraged to continue with lifestyle modifications. Follow up as needed with HTN clinic and as previously scheduled with Dr Ellyn Hack.     Oliviya Gilkison  Rodriguez-Guzman  PharmD, BCPS, [place] [address] [place] [address] 01/02/2018 5:38 PM

## 2017-12-29 NOTE — Patient Instructions (Addendum)
Return for a follow up appointment as needed  Check your blood pressure at home daily (if able) and keep record of the readings.  Take your BP meds as follows: *NO MEDICATION CHANGES*  Bring all of your meds, your BP cuff and your record of home blood pressures to your next appointment.  Exercise as you're able, try to walk approximately 30 minutes per day.  Keep salt intake to a minimum, especially watch canned and prepared boxed foods.  Eat more fresh fruits and vegetables and fewer canned items.  Avoid eating in fast food restaurants.    HOW TO TAKE YOUR BLOOD PRESSURE: . Rest 5 minutes before taking your blood pressure. .  Don't smoke or drink caffeinated beverages for at least 30 minutes before. . Take your blood pressure before (not after) you eat. . Sit comfortably with your back supported and both feet on the floor (don't cross your legs). . Elevate your arm to heart level on a table or a desk. . Use the proper sized cuff. It should fit smoothly and snugly around your bare upper arm. There should be enough room to slip a fingertip under the cuff. The bottom edge of the cuff should be 1 inch above the crease of the elbow. . Ideally, take 3 measurements at one sitting and record the average.

## 2018-01-02 NOTE — Assessment & Plan Note (Signed)
Blood pressure today significantly improved. Patient denies problems with current therapy or any adverse events.  We don't have home BP readings for assessment but clinic reading has improved almost 20 mmHg since last office visit. Will continue current medication without changes and patient was encouraged to continue with lifestyle modifications. Follow up as needed with HTN clinic and as previously scheduled with Dr Ellyn Hack.

## 2018-01-04 ENCOUNTER — Encounter: Payer: Self-pay | Admitting: Pharmacist

## 2018-01-25 ENCOUNTER — Telehealth: Payer: Self-pay | Admitting: *Deleted

## 2018-01-25 DIAGNOSIS — I2510 Atherosclerotic heart disease of native coronary artery without angina pectoris: Secondary | ICD-10-CM

## 2018-01-25 DIAGNOSIS — I25700 Atherosclerosis of coronary artery bypass graft(s), unspecified, with unstable angina pectoris: Secondary | ICD-10-CM

## 2018-01-25 DIAGNOSIS — E785 Hyperlipidemia, unspecified: Secondary | ICD-10-CM

## 2018-01-25 DIAGNOSIS — Z9861 Coronary angioplasty status: Principal | ICD-10-CM

## 2018-01-25 NOTE — Telephone Encounter (Signed)
-----   Message from Raiford Simmonds, RN sent at 11/16/2017  2:33 PM EST ----- Will need order for labs lipid ,hepatic for 02/12/18 if cvrr does not  rorder after possible restarting repatha

## 2018-01-25 NOTE — Telephone Encounter (Signed)
Mailed letter and labslip 

## 2018-02-15 ENCOUNTER — Ambulatory Visit: Payer: Medicare Other | Admitting: Cardiology

## 2018-04-05 ENCOUNTER — Ambulatory Visit: Payer: Medicare Other | Admitting: Cardiology

## 2018-04-05 ENCOUNTER — Encounter: Payer: Self-pay | Admitting: Cardiology

## 2018-04-05 VITALS — BP 118/82 | HR 69 | Ht 70.0 in | Wt 277.2 lb

## 2018-04-05 DIAGNOSIS — I10 Essential (primary) hypertension: Secondary | ICD-10-CM | POA: Diagnosis not present

## 2018-04-05 DIAGNOSIS — G250 Essential tremor: Secondary | ICD-10-CM | POA: Diagnosis not present

## 2018-04-05 DIAGNOSIS — Z9861 Coronary angioplasty status: Secondary | ICD-10-CM

## 2018-04-05 DIAGNOSIS — I2510 Atherosclerotic heart disease of native coronary artery without angina pectoris: Secondary | ICD-10-CM | POA: Diagnosis not present

## 2018-04-05 DIAGNOSIS — I214 Non-ST elevation (NSTEMI) myocardial infarction: Secondary | ICD-10-CM | POA: Diagnosis not present

## 2018-04-05 DIAGNOSIS — R0609 Other forms of dyspnea: Secondary | ICD-10-CM

## 2018-04-05 DIAGNOSIS — E785 Hyperlipidemia, unspecified: Secondary | ICD-10-CM

## 2018-04-05 DIAGNOSIS — I25700 Atherosclerosis of coronary artery bypass graft(s), unspecified, with unstable angina pectoris: Secondary | ICD-10-CM | POA: Diagnosis not present
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
## 2018-04-05 NOTE — Patient Instructions (Signed)
NO MEDICATION CHANGES     LABS IN OCT  WILL MAIL LABSLIP IN SEPT 2019 LIPID CMP   Your physician recommends that you schedule a follow-up appointment in [place].   If you need a refill on your cardiac medications before your next appointment, please call your pharmacy.

## 2018-04-09 ENCOUNTER — Encounter: Payer: Self-pay | Admitting: Cardiology

## 2018-04-09 NOTE — Assessment & Plan Note (Signed)
Dramatic response to recent Interventional PCI-PTCA.

## 2018-04-09 NOTE — Assessment & Plan Note (Signed)
Back on Repatha now.  Coverage now reestablished.  He is due for follow-up labs in October. Needs to get back on diet & continue to exercise.

## 2018-04-09 NOTE — Assessment & Plan Note (Signed)
Significant native coronary disease with 3 of 4 grafts patent, one has extensive stent.  He also has a DES stent going through the SVG-ramus into the ramus. On DAPT as discussed. On Beta Blocker & ARB along with Repatha.  Wgt loss!

## 2018-04-09 NOTE — Assessment & Plan Note (Signed)
Needs to get back on diet & continue to exercise.

## 2018-04-09 NOTE — Assessment & Plan Note (Signed)
Remains completely improved from a symptom standpoint post PTCA and PCI.  No further angina. Continue aspirin and Brilinta along with beta-blocker and ARB. --Is on propranolol for headaches and it seems to be working well for blood pressure as well.  Okay to stop aspirin after 3 months.  He did not ask about back surgery, but if this becomes an issue, he would be okay by August timeframe to suspend Brilinta for 5 to 7 days preop if necessary.  If not would be best to wait until January 2020

## 2018-04-09 NOTE — Assessment & Plan Note (Signed)
Propranolol is more effective for Tremor & HA - therefore we are using a atypical Beta Blocker.

## 2018-04-09 NOTE — Assessment & Plan Note (Signed)
Much better controlled with current meds.  The addition of chlorthalidone seem to have helped his edema and his blood pressure.

## 2018-06-06 ENCOUNTER — Telehealth: Payer: Self-pay | Admitting: Pharmacist Clinician (PhC)/ Clinical Pharmacy Specialist

## 2018-06-06 DIAGNOSIS — I2510 Atherosclerotic heart disease of native coronary artery without angina pectoris: Secondary | ICD-10-CM

## 2018-06-06 DIAGNOSIS — E785 Hyperlipidemia, unspecified: Secondary | ICD-10-CM

## 2018-06-06 DIAGNOSIS — I214 Non-ST elevation (NSTEMI) myocardial infarction: Secondary | ICD-10-CM

## 2018-06-06 DIAGNOSIS — Z9861 Coronary angioplasty status: Principal | ICD-10-CM

## 2018-06-06 NOTE — Telephone Encounter (Signed)
Lab orders mailed to patient.  Must have lipids drawn prior to re-authorizing Repatha rx.    Patient aware

## 2018-06-29 ENCOUNTER — Other Ambulatory Visit: Payer: Self-pay | Admitting: Neurosurgery

## 2018-06-29 ENCOUNTER — Telehealth: Payer: Self-pay

## 2018-06-29 DIAGNOSIS — M47816 Spondylosis without myelopathy or radiculopathy, lumbar region: Secondary | ICD-10-CM

## 2018-06-29 NOTE — Telephone Encounter (Signed)
   [facility] Pre-operative Risk Assessment    Request for surgical clearance:  1. What type of surgery is being performed?  Lumbar facet injection    2. When is this surgery scheduled?  TBD from response   3. What type of clearance is required (medical clearance vs. Pharmacy clearance to hold med vs. Both)? Pharmacy  4. Are there any medications that need to be held prior to surgery and how long? Brilinta, 5 days   5. Practice name and name of physician performing surgery?  [facility], not specified    6. What is your office phone number? [phone]    7.   What is your office fax number? [phone]  8.   Anesthesia type (None, local, MAC, general) ? Not specified   [name] 06/29/2018, 4:37 PM  _________________________________________________________________   (provider comments below)

## 2018-06-29 NOTE — Telephone Encounter (Signed)
   Primary Cardiologist: [name], MD  Chart reviewed as part of pre-operative protocol coverage. Given past medical history and time since last visit, based on ACC/AHA guidelines, [name] would be at acceptable risk for the planned procedure without further cardiovascular testing.   Pt recently seen by Dr. Ellyn Hack 04/05/18 and he addressed back surgery/ procedures and antiplatelet therapy. Per documentation, "he would be okay by August timeframe to suspend Brilinta for 5 to 7 days preop if necessary".    I spoke with pt over the phone and he denies any cardiac symptoms including no CP or dyspnea. Ok to hold Brilinta 5-7 days prior to procedure. Resume as soon as safe to do so from a bleed standpoint.  I will route this recommendation to the requesting party via Epic fax function and remove from pre-op pool.  Please call with questions.  Lyda Jester, PA-C 06/29/2018, 4:56 PM

## 2018-07-03 ENCOUNTER — Telehealth: Payer: Self-pay | Admitting: Cardiology

## 2018-07-03 MED ORDER — LOSARTAN POTASSIUM 100 MG PO TABS: 100.0000 mg | ORAL_TABLET | Freq: Every day | ORAL | 0 refills | Status: DC

## 2018-07-03 NOTE — Telephone Encounter (Signed)
Returned call to patient, patient states Friday night he all of sudden has pain from his belt line up to his neck and extreme nausea.  States he felt horrible all week.   He states he sat down to rest, took 2 NTG with decrease in pain/symptoms.  Took his BP and it was 170/99.   States his BP is always high, this is "not terrible" for him. States this episode felt similar to his first MI.   He has not had another episode but still doesn't feel well.  Denies symptoms at current.  BP this AM 183/118, states he is having a lot of back issues as well with a lot of pain that may be contributing to this.   Also reports SOB yesterday and today more than usual.   appt made for tomorrow at 1130 AM with Almyra Deforest PA at [facility].  Advised if symptoms reoccur, proceed to ER for evaluation.   Patient aware and verbalized understanding.     Routed to pharmD and MD to review for additional urgent recommendations in regards to BP.

## 2018-07-03 NOTE — Telephone Encounter (Signed)
New Message   Patient is calling because on Friday night had an extreme bout of nausea that lasted a while. He says he normal does not have chest pain but the nausea that leads up to problems with his chest. Please call to discuss.

## 2018-07-03 NOTE — Telephone Encounter (Signed)
Called patient for update on BP.  Restarted losartan 100 mg, took dose around 12 pm.   BP about 15 mins ago 160/99.   Advised to continue to monitor and keep appt for tomorrow for reassessment and further adjustments to be made.

## 2018-07-03 NOTE — Telephone Encounter (Signed)
Due to hx of CAD with severe stenosis and current symptoms I will recommend visit to nearest ER for assessment.

## 2018-07-03 NOTE — Telephone Encounter (Signed)
Reviewed with DOD, advised to take additional propanolol 10 mg if HR > 60 and if asymptomatic, ok to wait until appt tomorrow.   Spoke to patient, he states he did not take Losartan as he is out of this medication.   Sent rx to pharmacy, patient will go pick up medication now and advised I will call back this afternoon to reassess BP.      Patient currently asymptomatic.

## 2018-07-04 ENCOUNTER — Other Ambulatory Visit: Payer: Self-pay | Admitting: Physician Assistant

## 2018-07-04 ENCOUNTER — Encounter (HOSPITAL_COMMUNITY)
Admission: EM | Disposition: A | Discharge status: Home / Self Care | Payer: Self-pay | Source: Home / Self Care | Attending: Internal Medicine

## 2018-07-04 ENCOUNTER — Emergency Department (HOSPITAL_COMMUNITY): Payer: Medicare Other

## 2018-07-04 ENCOUNTER — Other Ambulatory Visit: Payer: Self-pay

## 2018-07-04 ENCOUNTER — Encounter: Payer: Self-pay | Admitting: Physician Assistant

## 2018-07-04 ENCOUNTER — Telehealth: Payer: Self-pay | Admitting: Physician Assistant

## 2018-07-04 ENCOUNTER — Encounter (HOSPITAL_COMMUNITY): Payer: Self-pay | Admitting: Emergency Medicine

## 2018-07-04 ENCOUNTER — Emergency Department (HOSPITAL_COMMUNITY): Payer: Medicare Other | Admitting: Certified Registered"

## 2018-07-04 ENCOUNTER — Ambulatory Visit: Payer: Medicare Other | Admitting: Physician Assistant

## 2018-07-04 ENCOUNTER — Inpatient Hospital Stay (HOSPITAL_COMMUNITY)
Admission: EM | Admit: 2018-07-04 | Discharge: 2018-07-07 | DRG: 661 | Disposition: A | Discharge status: Home / Self Care | Payer: Medicare Other | Attending: Internal Medicine | Admitting: Internal Medicine

## 2018-07-04 VITALS — BP 165/101 | HR 67 | Ht 71.0 in | Wt 278.2 lb

## 2018-07-04 DIAGNOSIS — N184 Chronic kidney disease, stage 4 (severe): Secondary | ICD-10-CM | POA: Diagnosis present

## 2018-07-04 DIAGNOSIS — Z951 Presence of aortocoronary bypass graft: Secondary | ICD-10-CM

## 2018-07-04 DIAGNOSIS — N179 Acute kidney failure, unspecified: Secondary | ICD-10-CM | POA: Diagnosis not present

## 2018-07-04 DIAGNOSIS — N201 Calculus of ureter: Secondary | ICD-10-CM

## 2018-07-04 DIAGNOSIS — Z888 Allergy status to other drugs, medicaments and biological substances status: Secondary | ICD-10-CM

## 2018-07-04 DIAGNOSIS — Z8349 Family history of other endocrine, nutritional and metabolic diseases: Secondary | ICD-10-CM

## 2018-07-04 DIAGNOSIS — Z87442 Personal history of urinary calculi: Secondary | ICD-10-CM

## 2018-07-04 DIAGNOSIS — R079 Chest pain, unspecified: Secondary | ICD-10-CM

## 2018-07-04 DIAGNOSIS — E785 Hyperlipidemia, unspecified: Secondary | ICD-10-CM | POA: Diagnosis present

## 2018-07-04 DIAGNOSIS — Z7902 Long term (current) use of antithrombotics/antiplatelets: Secondary | ICD-10-CM

## 2018-07-04 DIAGNOSIS — I25810 Atherosclerosis of coronary artery bypass graft(s) without angina pectoris: Secondary | ICD-10-CM | POA: Diagnosis not present

## 2018-07-04 DIAGNOSIS — I25700 Atherosclerosis of coronary artery bypass graft(s), unspecified, with unstable angina pectoris: Secondary | ICD-10-CM | POA: Diagnosis not present

## 2018-07-04 DIAGNOSIS — I10 Essential (primary) hypertension: Secondary | ICD-10-CM | POA: Diagnosis not present

## 2018-07-04 DIAGNOSIS — Z9989 Dependence on other enabling machines and devices: Secondary | ICD-10-CM

## 2018-07-04 DIAGNOSIS — Z7982 Long term (current) use of aspirin: Secondary | ICD-10-CM

## 2018-07-04 DIAGNOSIS — Z8249 Family history of ischemic heart disease and other diseases of the circulatory system: Secondary | ICD-10-CM

## 2018-07-04 DIAGNOSIS — N132 Hydronephrosis with renal and ureteral calculous obstruction: Secondary | ICD-10-CM | POA: Diagnosis not present

## 2018-07-04 DIAGNOSIS — N183 Chronic kidney disease, stage 3 unspecified: Secondary | ICD-10-CM

## 2018-07-04 DIAGNOSIS — I2510 Atherosclerotic heart disease of native coronary artery without angina pectoris: Secondary | ICD-10-CM | POA: Diagnosis present

## 2018-07-04 DIAGNOSIS — Z6836 Body mass index (BMI) 36.0-36.9, adult: Secondary | ICD-10-CM

## 2018-07-04 DIAGNOSIS — Z955 Presence of coronary angioplasty implant and graft: Secondary | ICD-10-CM

## 2018-07-04 DIAGNOSIS — Z981 Arthrodesis status: Secondary | ICD-10-CM

## 2018-07-04 DIAGNOSIS — N189 Chronic kidney disease, unspecified: Secondary | ICD-10-CM

## 2018-07-04 DIAGNOSIS — Z79899 Other long term (current) drug therapy: Secondary | ICD-10-CM

## 2018-07-04 DIAGNOSIS — I252 Old myocardial infarction: Secondary | ICD-10-CM

## 2018-07-04 DIAGNOSIS — G250 Essential tremor: Secondary | ICD-10-CM | POA: Diagnosis present

## 2018-07-04 DIAGNOSIS — F329 Major depressive disorder, single episode, unspecified: Secondary | ICD-10-CM | POA: Diagnosis present

## 2018-07-04 DIAGNOSIS — G4733 Obstructive sleep apnea (adult) (pediatric): Secondary | ICD-10-CM | POA: Diagnosis present

## 2018-07-04 DIAGNOSIS — M549 Dorsalgia, unspecified: Secondary | ICD-10-CM

## 2018-07-04 DIAGNOSIS — N200 Calculus of kidney: Secondary | ICD-10-CM

## 2018-07-04 DIAGNOSIS — G8929 Other chronic pain: Secondary | ICD-10-CM | POA: Diagnosis present

## 2018-07-04 DIAGNOSIS — I129 Hypertensive chronic kidney disease with stage 1 through stage 4 chronic kidney disease, or unspecified chronic kidney disease: Secondary | ICD-10-CM | POA: Diagnosis present

## 2018-07-04 DIAGNOSIS — N134 Hydroureter: Secondary | ICD-10-CM

## 2018-07-04 HISTORY — PX: CYSTOSCOPY WITH URETEROSCOPY AND STENT PLACEMENT: SHX6377

## 2018-07-04 LAB — CBC
HEMOGLOBIN: 15.1 g/dL (ref 13.0–17.7)
Hematocrit: 42.9 % (ref 37.5–51.0)
MCH: 29 pg (ref 26.6–33.0)
MCHC: 35.2 g/dL (ref 31.5–35.7)
MCV: 82 fL (ref 79–97)
PLATELETS: 344 10*3/uL (ref 150–450)
RBC: 5.21 x10E6/uL (ref 4.14–5.80)
RDW: 14.7 % (ref 12.3–15.4)
WBC: 11.2 10*3/uL — ABNORMAL HIGH (ref 3.4–10.8)

## 2018-07-04 LAB — URINALYSIS, ROUTINE W REFLEX MICROSCOPIC
BILIRUBIN URINE: NEGATIVE
Glucose, UA: 50 mg/dL — AB
KETONES UR: NEGATIVE mg/dL
Leukocytes, UA: NEGATIVE
NITRITE: NEGATIVE
PH: 6 (ref 5.0–8.0)
Protein, ur: NEGATIVE mg/dL
SPECIFIC GRAVITY, URINE: 1.012 (ref 1.005–1.030)

## 2018-07-04 LAB — COMPREHENSIVE METABOLIC PANEL
ALBUMIN: 4.1 g/dL (ref 3.6–4.8)
ALK PHOS: 69 IU/L (ref 39–117)
ALT: 11 IU/L (ref 0–44)
AST: 8 IU/L (ref 0–40)
Albumin/Globulin Ratio: 1.3 (ref 1.2–2.2)
BILIRUBIN TOTAL: 0.9 mg/dL (ref 0.0–1.2)
BUN/Creatinine Ratio: 7 — ABNORMAL LOW (ref 10–24)
BUN: 79 mg/dL — AB (ref 8–27)
CHLORIDE: 93 mmol/L — AB (ref 96–106)
CO2: 15 mmol/L — AB (ref 20–29)
CREATININE: 10.83 mg/dL — AB (ref 0.76–1.27)
Calcium: 9.1 mg/dL (ref 8.6–10.2)
GFR calc Af Amer: 5 mL/min/{1.73_m2} — ABNORMAL LOW (ref >59)
GFR calc non Af Amer: 5 mL/min/{1.73_m2} — ABNORMAL LOW (ref >59)
GLUCOSE: 79 mg/dL (ref 65–99)
Globulin, Total: 3.1 g/dL (ref 1.5–4.5)
Potassium: 5.1 mmol/L (ref 3.5–5.2)
SODIUM: 133 mmol/L — AB (ref 134–144)
Total Protein: 7.2 g/dL (ref 6.0–8.5)

## 2018-07-04 LAB — LIPID PANEL
CHOL/HDL RATIO: 5.2 ratio — AB (ref 0.0–5.0)
Cholesterol, Total: 183 mg/dL (ref 100–199)
HDL: 35 mg/dL — ABNORMAL LOW (ref >39)
LDL CALC: 100 mg/dL — AB (ref 0–99)
TRIGLYCERIDES: 238 mg/dL — AB (ref 0–149)
VLDL CHOLESTEROL CAL: 48 mg/dL — AB (ref 5–40)

## 2018-07-04 LAB — BASIC METABOLIC PANEL
BUN / CREAT RATIO: 7 — AB (ref 10–24)
BUN: 78 mg/dL (ref 8–27)
CHLORIDE: 93 mmol/L — AB (ref 96–106)
CO2: 18 mmol/L — AB (ref 20–29)
Calcium: 9.1 mg/dL (ref 8.6–10.2)
Creatinine, Ser: 10.92 mg/dL — ABNORMAL HIGH (ref 0.76–1.27)
GFR calc Af Amer: 5 mL/min/{1.73_m2} — ABNORMAL LOW (ref >59)
GFR calc non Af Amer: 4 mL/min/{1.73_m2} — ABNORMAL LOW (ref >59)
GLUCOSE: 75 mg/dL (ref 65–99)
POTASSIUM: 5.4 mmol/L — AB (ref 3.5–5.2)
Sodium: 133 mmol/L — ABNORMAL LOW (ref 134–144)

## 2018-07-04 LAB — TROPONIN I: TROPONIN I: 0.03 ng/mL (ref 0.00–0.04)

## 2018-07-04 SURGERY — CYSTOURETEROSCOPY, WITH STENT INSERTION
Anesthesia: General | Site: Ureter | Laterality: Right

## 2018-07-04 MED ORDER — FENTANYL CITRATE (PF) 250 MCG/5ML IJ SOLN
INTRAMUSCULAR | Status: AC
  Filled 2018-07-04: qty 5

## 2018-07-04 MED ORDER — CEFAZOLIN SODIUM 1 G IJ SOLR
INTRAMUSCULAR | Status: AC
  Filled 2018-07-04: qty 10

## 2018-07-04 MED ORDER — SUCCINYLCHOLINE CHLORIDE 20 MG/ML IJ SOLN
INTRAMUSCULAR | Status: DC | PRN
  Administered 2018-07-04: 120 mg via INTRAVENOUS

## 2018-07-04 MED ORDER — FENTANYL CITRATE (PF) 100 MCG/2ML IJ SOLN
INTRAMUSCULAR | Status: DC | PRN
  Administered 2018-07-04: 50 ug via INTRAVENOUS
  Administered 2018-07-04: 100 ug via INTRAVENOUS
  Administered 2018-07-04 – 2018-07-05 (×2): 50 ug via INTRAVENOUS

## 2018-07-04 MED ORDER — MORPHINE SULFATE (PF) 4 MG/ML IV SOLN
4.0000 mg | Freq: Once | INTRAVENOUS | Status: AC
  Administered 2018-07-04: 4 mg via INTRAVENOUS
  Filled 2018-07-04: qty 1

## 2018-07-04 MED ORDER — LIDOCAINE 2% (20 MG/ML) 5 ML SYRINGE
INTRAMUSCULAR | Status: DC | PRN
  Administered 2018-07-04: 100 mg via INTRAVENOUS

## 2018-07-04 MED ORDER — CEFAZOLIN SODIUM-DEXTROSE 1-4 GM/50ML-% IV SOLN
INTRAVENOUS | Status: DC | PRN
  Administered 2018-07-04: 1 g via INTRAVENOUS

## 2018-07-04 MED ORDER — ONDANSETRON HCL 4 MG/2ML IJ SOLN
4.0000 mg | Freq: Once | INTRAMUSCULAR | Status: AC
  Administered 2018-07-04: 4 mg via INTRAVENOUS
  Filled 2018-07-04: qty 2

## 2018-07-04 MED ORDER — IOPAMIDOL (ISOVUE-300) INJECTION 61%
INTRAVENOUS | Status: AC
  Filled 2018-07-04: qty 50

## 2018-07-04 MED ORDER — IOPAMIDOL (ISOVUE-300) INJECTION 61%
INTRAVENOUS | Status: DC | PRN
  Administered 2018-07-04: 50 mL via URETHRAL

## 2018-07-04 MED ORDER — PROPOFOL 10 MG/ML IV BOLUS
INTRAVENOUS | Status: AC
  Filled 2018-07-04: qty 20

## 2018-07-04 MED ORDER — PROPOFOL 10 MG/ML IV BOLUS
INTRAVENOUS | Status: DC | PRN
  Administered 2018-07-04: 120 mg via INTRAVENOUS

## 2018-07-04 MED ORDER — AMLODIPINE BESYLATE 5 MG PO TABS: 5.0000 mg | ORAL_TABLET | Freq: Every day | ORAL | 3 refills | Status: DC

## 2018-07-04 MED ORDER — SODIUM CHLORIDE 0.9 % IR SOLN
Status: DC | PRN
  Administered 2018-07-04: 3000 mL via INTRAVESICAL

## 2018-07-04 SURGICAL SUPPLY — 14 items
BAG URO CATCHER STRL LF (MISCELLANEOUS) ×3 IMPLANT
CATH INTERMIT  6FR 70CM (CATHETERS) IMPLANT
GLOVE BIO SURGEON STRL SZ7.5 (GLOVE) ×3 IMPLANT
GOWN SRG XL LVL 4 BRTHBL STRL (GOWNS) ×1 IMPLANT
GOWN STRL NON-REIN LRG LVL3 (GOWN DISPOSABLE) ×3 IMPLANT
GOWN STRL NON-REIN XL LVL4 (GOWNS) ×3
GUIDEWIRE ANG ZIPWIRE 038X150 (WIRE) ×3 IMPLANT
GUIDEWIRE STR DUAL SENSOR (WIRE) IMPLANT
IV NS IRRIG 3000ML ARTHROMATIC (IV SOLUTION) ×3 IMPLANT
PACK CYSTO (CUSTOM PROCEDURE TRAY) ×3 IMPLANT
STENT URET 6FRX26 CONTOUR (STENTS) ×2 IMPLANT
SYR 10ML LL (SYRINGE) IMPLANT
TOWEL OR 17X24 6PK STRL BLUE (TOWEL DISPOSABLE) ×3 IMPLANT
TUBE FEEDING 8FR 16IN STR KANG (MISCELLANEOUS) ×2 IMPLANT

## 2018-07-04 NOTE — Anesthesia Procedure Notes (Signed)
Procedure Name: Intubation Date/Time: 07/04/2018 11:46 PM Performed by: Babs Bertin, CRNA Pre-anesthesia Checklist: Patient identified, Emergency Drugs available, Suction available and Patient being monitored Patient Re-evaluated:Patient Re-evaluated prior to induction Oxygen Delivery Method: Circle System Utilized Preoxygenation: Pre-oxygenation with 100% oxygen Induction Type: IV induction, Rapid sequence and Cricoid Pressure applied Laryngoscope Size: Mac and 3 Grade View: Grade III Tube type: Oral Tube size: 7.5 mm Number of attempts: 1 Airway Equipment and Method: Stylet and Oral airway Placement Confirmation: ETT inserted through vocal cords under direct vision,  positive ETCO2 and breath sounds checked- equal and bilateral Secured at: 23 cm Tube secured with: Tape Dental Injury: Teeth and Oropharynx as per pre-operative assessment
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
## 2018-07-04 NOTE — ED Provider Notes (Signed)
Medical screening examination/treatment/procedure(s) were conducted as a shared visit with non-physician practitioner(s) and myself.  I personally evaluated the patient during the encounter.  None 62 year old male presents here after being found to have a creatinine over 10.  Has evidence of sick similar kidney stone causing severe obstruction.  Urology consulted and will come in take patient to the operating room.  Will be admitted to the medicine service   Lacretia Leigh, MD 07/04/18 2253

## 2018-07-04 NOTE — ED Triage Notes (Signed)
Pt sent by doctor for elevated creatine levels. C/o back pain, worse with palpation. Also states he had an episode of abdominal pain with nausea on Friday, reports hematuria x 2. Denies chest pain/shortness of breath. Scheduled for cardiac cath this week.

## 2018-07-04 NOTE — Telephone Encounter (Signed)
Received critical lab result for the Creatinine. Baseline Cr around 1.6, today's Cr was >10 on two separate check. Patient was originally scheduled for cardiac catheterization due to concerning features of angina.  However given the lab result, cardiac catheterization has been canceled.  Case discussed with Dr. Ellyn Hack, I also informed the patient that he need to go to the emergency room.  He likely will need to be admitted to the medicine service and have nephrology consultation.  Hilbert Corrigan PA Pager: [phone]

## 2018-07-04 NOTE — Anesthesia Preprocedure Evaluation (Signed)
Anesthesia Evaluation  Patient identified by MRN, date of birth, ID band Patient awake    Reviewed: Allergy & Precautions, NPO status , Patient's Chart, lab work & pertinent test results  Airway Mallampati: II  TM Distance: >3 FB Neck ROM: Full    Dental   Pulmonary sleep apnea ,    Pulmonary exam normal        Cardiovascular hypertension, Pt. on medications + CAD, + Past MI, + Cardiac Stents and + CABG  Normal cardiovascular exam     Neuro/Psych Depression    GI/Hepatic   Endo/Other    Renal/GU      Musculoskeletal   Abdominal   Peds  Hematology   Anesthesia Other Findings   Reproductive/Obstetrics                             Anesthesia Physical Anesthesia Plan  ASA: III  Anesthesia Plan: General   Post-op Pain Management:    Induction: Intravenous, Cricoid pressure planned and Rapid sequence  PONV Risk Score and Plan: 2 and Ondansetron and Treatment may vary due to age or medical condition  Airway Management Planned: Oral ETT  Additional Equipment:   Intra-op Plan:   Post-operative Plan: Extubation in OR  Informed Consent: I have reviewed the patients History and Physical, chart, labs and discussed the procedure including the risks, benefits and alternatives for the proposed anesthesia with the patient or authorized representative who has indicated his/her understanding and acceptance.     Plan Discussed with: CRNA and Surgeon  Anesthesia Plan Comments:         Anesthesia Quick Evaluation

## 2018-07-04 NOTE — Patient Instructions (Addendum)
Medication Instructions:  START-Amlodipine 5 mg daily  If you need a refill on your cardiac medications before your next appointment, please call your pharmacy.  Labwork: Pre Op Labs HERE IN OUR OFFICE AT [facility]  Take the provided lab slips with you to the lab for your blood draw.   You will NOT need to fast   Testing/Procedures: Your physician has requested that you have a cardiac catheterization. Cardiac catheterization is used to diagnose and/or treat various heart conditions. Doctors may recommend this procedure for a number of different reasons. The most common reason is to evaluate chest pain. Chest pain can be a symptom of coronary artery disease (CAD), and cardiac catheterization can show whether plaque is narrowing or blocking your heart's arteries. This procedure is also used to evaluate the valves, as well as measure the blood flow and oxygen levels in different parts of your heart. For further information please visit [email or web address]. Please follow instruction sheet, as given.  Special Instructions:    [place] [address] Dept: [phone] Loc: [place]  07/04/2018  You are scheduled for a Cardiac Catheterization on Thursday, September 26 with Dr. Shelva Majestic.  1. Please arrive at the [facility] (Main Entrance A) at [facility]: [address] [place], [place] [address] at 9:30 AM (This time is two hours before your procedure to ensure your preparation). Free valet parking service is available.   Special note: Every effort is made to have your procedure done on time. Please understand that emergencies sometimes delay scheduled procedures.  2. Diet: Do not eat solid foods after midnight.  The patient may have clear liquids until 5am upon the day of the procedure.  3. Labs: You will need to have blood drawn on Tuesday,  September 24 at [place]  Open: 8am - 5pm (Lunch 12:30 - 1:30)   Phone: [phone]. You do not need to be fasting.  4. Medication instructions in preparation for your procedure:   Contrast Allergy: No    Current Outpatient Medications (Cardiovascular):  Marland Kitchen  Evolocumab (REPATHA SURECLICK) 921 MG/ML SOAJ, Inject 140 mg into the skin every 14 (fourteen) days. Marland Kitchen  losartan (COZAAR) 100 MG tablet, Take 1 tablet (100 mg total) by mouth daily. .  nitroGLYCERIN (NITROSTAT) 0.4 MG SL tablet, PLACE 1TAB UNDER TONGUE EVERY 5MIN FOR 3DOSES AS NEEDED FOR CHEST PAIN .  propranolol (INDERAL) 20 MG tablet, Take 1 tablet (20 mg total) by mouth 3 (three) times daily.   Current Outpatient Medications (Analgesics):  .  aspirin EC 81 MG tablet, Take 1 tablet (81 mg total) by mouth daily. Marland Kitchen  HYDROcodone-acetaminophen (NORCO) 7.5-325 MG tablet,   Current Outpatient Medications (Hematological):  Marland Kitchen  BRILINTA 90 MG TABS tablet, TAKE 1 TABLET BY MOUTH TWICE A DAY  Current Outpatient Medications (Other):  Marland Kitchen  buPROPion (WELLBUTRIN SR) 150 MG 12 hr tablet, Take 150 mg by mouth 2 (two) times daily. .  clonazePAM (KLONOPIN) 2 MG tablet, Take 1 tablet (2 mg total) by mouth at bedtime. .  cyclobenzaprine (FLEXERIL) 10 MG tablet, Take 10 mg by mouth 3 (three) times daily as needed for muscle spasms.  *For reference purposes while preparing patient instructions.   Delete this med list prior to printing instructions for patient.*   Stop taking, Cozaar (Losartan) on Thursday, September 26.   On the morning of your procedure, take your  Aspirin and Brilinta/Ticagrelor and any morning medicines NOT listed above.  You may use sips of water.  5. Plan for one night stay--bring personal belongings. 6. Bring a current list of your medications and current insurance cards. 7. You MUST have a responsible person to drive you home. 8. Someone MUST be with you the first 24 hours after you  arrive home or your discharge will be delayed. 9. Please wear clothes that are easy to get on and off and wear slip-on shoes.  Thank you for allowing [place] to care for you!   -- [facility]   Follow-Up: Your physician wants you to follow-up in: After Cath.       Thank you for choosing [facility] at [facility]!!

## 2018-07-04 NOTE — Telephone Encounter (Signed)
See Phone note today, kidney function crashed, I have personally called and informed patient to hold losartan and go to ED
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
## 2018-07-04 NOTE — ED Provider Notes (Signed)
Patient placed in Quick Look pathway, seen and evaluated   Chief Complaint: Abnormal lab  HPI:   Patient reports history of nine MI'. He saw his cardiologist today because he had an episode of indigestion and nausea which improved with nitroglycerin four days ago. Heart cath scheduled for  Tomorrow, screening labs drawn today. He was sent here because creatinine was elevated 10.83 (1.72 8mo ago). Patient states he has a history of several kidney stones in the past. Had two episodes of hematuria three weeks ago. States that he has right sided flank pain which is constant and unchanged with movement. No hx of injury.  ROS: No abd pain  Physical Exam:   Gen: No distress  Neuro: Awake and Alert  Skin: Warm    Focused Exam: Point tender to palpation over right lower back. No overlying rash on skin. No midline spinal tenderness.    Initiation of care has begun. The patient has been counseled on the process, plan, and necessity for staying for the completion/evaluation, and the remainder of the medical screening examination    [name] 07/04/18 [place], [place], DO 07/04/18 2235

## 2018-07-05 ENCOUNTER — Encounter (HOSPITAL_COMMUNITY): Payer: Self-pay | Admitting: Internal Medicine

## 2018-07-05 ENCOUNTER — Inpatient Hospital Stay (HOSPITAL_COMMUNITY): Payer: Medicare Other

## 2018-07-05 ENCOUNTER — Other Ambulatory Visit: Payer: Self-pay

## 2018-07-05 DIAGNOSIS — I129 Hypertensive chronic kidney disease with stage 1 through stage 4 chronic kidney disease, or unspecified chronic kidney disease: Secondary | ICD-10-CM | POA: Diagnosis present

## 2018-07-05 DIAGNOSIS — Z955 Presence of coronary angioplasty implant and graft: Secondary | ICD-10-CM | POA: Diagnosis not present

## 2018-07-05 DIAGNOSIS — N184 Chronic kidney disease, stage 4 (severe): Secondary | ICD-10-CM | POA: Diagnosis present

## 2018-07-05 DIAGNOSIS — G8929 Other chronic pain: Secondary | ICD-10-CM | POA: Diagnosis present

## 2018-07-05 DIAGNOSIS — N200 Calculus of kidney: Secondary | ICD-10-CM

## 2018-07-05 DIAGNOSIS — I2510 Atherosclerotic heart disease of native coronary artery without angina pectoris: Secondary | ICD-10-CM | POA: Diagnosis present

## 2018-07-05 DIAGNOSIS — G4733 Obstructive sleep apnea (adult) (pediatric): Secondary | ICD-10-CM | POA: Diagnosis present

## 2018-07-05 DIAGNOSIS — N183 Chronic kidney disease, stage 3 (moderate): Secondary | ICD-10-CM | POA: Diagnosis present

## 2018-07-05 DIAGNOSIS — I252 Old myocardial infarction: Secondary | ICD-10-CM | POA: Diagnosis not present

## 2018-07-05 DIAGNOSIS — N132 Hydronephrosis with renal and ureteral calculous obstruction: Secondary | ICD-10-CM | POA: Diagnosis present

## 2018-07-05 DIAGNOSIS — Z7902 Long term (current) use of antithrombotics/antiplatelets: Secondary | ICD-10-CM | POA: Diagnosis not present

## 2018-07-05 DIAGNOSIS — F329 Major depressive disorder, single episode, unspecified: Secondary | ICD-10-CM | POA: Diagnosis present

## 2018-07-05 DIAGNOSIS — Z7982 Long term (current) use of aspirin: Secondary | ICD-10-CM | POA: Diagnosis not present

## 2018-07-05 DIAGNOSIS — M5440 Lumbago with sciatica, unspecified side: Secondary | ICD-10-CM | POA: Diagnosis not present

## 2018-07-05 DIAGNOSIS — G250 Essential tremor: Secondary | ICD-10-CM | POA: Diagnosis present

## 2018-07-05 DIAGNOSIS — Z79899 Other long term (current) drug therapy: Secondary | ICD-10-CM | POA: Diagnosis not present

## 2018-07-05 DIAGNOSIS — Z87442 Personal history of urinary calculi: Secondary | ICD-10-CM | POA: Diagnosis not present

## 2018-07-05 DIAGNOSIS — Z888 Allergy status to other drugs, medicaments and biological substances status: Secondary | ICD-10-CM | POA: Diagnosis not present

## 2018-07-05 DIAGNOSIS — N134 Hydroureter: Secondary | ICD-10-CM

## 2018-07-05 DIAGNOSIS — Z981 Arthrodesis status: Secondary | ICD-10-CM | POA: Diagnosis not present

## 2018-07-05 DIAGNOSIS — N201 Calculus of ureter: Secondary | ICD-10-CM | POA: Diagnosis present

## 2018-07-05 DIAGNOSIS — I10 Essential (primary) hypertension: Secondary | ICD-10-CM | POA: Diagnosis not present

## 2018-07-05 DIAGNOSIS — Z951 Presence of aortocoronary bypass graft: Secondary | ICD-10-CM | POA: Diagnosis not present

## 2018-07-05 DIAGNOSIS — Z6836 Body mass index (BMI) 36.0-36.9, adult: Secondary | ICD-10-CM | POA: Diagnosis not present

## 2018-07-05 DIAGNOSIS — Z8349 Family history of other endocrine, nutritional and metabolic diseases: Secondary | ICD-10-CM | POA: Diagnosis not present

## 2018-07-05 DIAGNOSIS — E785 Hyperlipidemia, unspecified: Secondary | ICD-10-CM | POA: Diagnosis present

## 2018-07-05 DIAGNOSIS — N179 Acute kidney failure, unspecified: Secondary | ICD-10-CM | POA: Diagnosis present

## 2018-07-05 DIAGNOSIS — Z8249 Family history of ischemic heart disease and other diseases of the circulatory system: Secondary | ICD-10-CM | POA: Diagnosis not present

## 2018-07-05 LAB — BASIC METABOLIC PANEL
Anion gap: 18 — ABNORMAL HIGH (ref 5–15)
BUN: 89 mg/dL — ABNORMAL HIGH (ref 8–23)
CALCIUM: 9.2 mg/dL (ref 8.9–10.3)
CO2: 20 mmol/L — ABNORMAL LOW (ref 22–32)
CREATININE: 12.58 mg/dL — AB (ref 0.61–1.24)
Chloride: 96 mmol/L — ABNORMAL LOW (ref 98–111)
GFR, EST AFRICAN AMERICAN: 4 mL/min — AB (ref >60)
GFR, EST NON AFRICAN AMERICAN: 4 mL/min — AB (ref >60)
Glucose, Bld: 99 mg/dL (ref 70–99)
Potassium: 4.4 mmol/L (ref 3.5–5.1)
Sodium: 134 mmol/L — ABNORMAL LOW (ref 135–145)

## 2018-07-05 LAB — HEPATIC FUNCTION PANEL
ALT: 14 U/L (ref 0–44)
AST: 15 U/L (ref 15–41)
Albumin: 3.9 g/dL (ref 3.5–5.0)
Alkaline Phosphatase: 67 U/L (ref 38–126)
BILIRUBIN DIRECT: 0.1 mg/dL (ref 0.0–0.2)
BILIRUBIN INDIRECT: 0.7 mg/dL (ref 0.3–0.9)
BILIRUBIN TOTAL: 0.8 mg/dL (ref 0.3–1.2)
Total Protein: 8 g/dL (ref 6.5–8.1)

## 2018-07-05 LAB — TYPE AND SCREEN
ABO/RH(D): B NEG
Antibody Screen: NEGATIVE

## 2018-07-05 LAB — HIV ANTIBODY (ROUTINE TESTING W REFLEX): HIV SCREEN 4TH GENERATION: NONREACTIVE

## 2018-07-05 MED ORDER — CYCLOBENZAPRINE HCL 10 MG PO TABS: 10.0000 mg | ORAL_TABLET | Freq: Three times a day (TID) | ORAL | Status: DC | PRN

## 2018-07-05 MED ORDER — AMLODIPINE BESYLATE 5 MG PO TABS
5.0000 mg | ORAL_TABLET | Freq: Every day | ORAL | Status: DC
  Administered 2018-07-05 – 2018-07-07 (×3): 5 mg via ORAL
  Filled 2018-07-05 (×3): qty 1

## 2018-07-05 MED ORDER — MEPERIDINE HCL 50 MG/ML IJ SOLN: 6.2500 mg | INTRAMUSCULAR | Status: DC | PRN

## 2018-07-05 MED ORDER — ONDANSETRON HCL 4 MG/2ML IJ SOLN
INTRAMUSCULAR | Status: DC | PRN
  Administered 2018-07-05: 4 mg via INTRAVENOUS

## 2018-07-05 MED ORDER — LACTATED RINGERS IV SOLN
INTRAVENOUS | Status: DC | PRN
  Administered 2018-07-04: 23:00:00 via INTRAVENOUS

## 2018-07-05 MED ORDER — ACETAMINOPHEN 325 MG PO TABS: 650.0000 mg | ORAL_TABLET | Freq: Four times a day (QID) | ORAL | Status: DC | PRN

## 2018-07-05 MED ORDER — PROPRANOLOL HCL 10 MG PO TABS
20.0000 mg | ORAL_TABLET | Freq: Three times a day (TID) | ORAL | Status: DC
  Administered 2018-07-05 – 2018-07-07 (×7): 20 mg via ORAL
  Filled 2018-07-05 (×7): qty 2

## 2018-07-05 MED ORDER — ONDANSETRON HCL 4 MG/2ML IJ SOLN: 4.0000 mg | Freq: Four times a day (QID) | INTRAMUSCULAR | Status: DC | PRN

## 2018-07-05 MED ORDER — TICAGRELOR 90 MG PO TABS
90.0000 mg | ORAL_TABLET | Freq: Two times a day (BID) | ORAL | Status: DC
  Administered 2018-07-05 – 2018-07-07 (×5): 90 mg via ORAL
  Filled 2018-07-05 (×5): qty 1

## 2018-07-05 MED ORDER — SODIUM CHLORIDE 0.9 % IV SOLN
INTRAVENOUS | Status: DC
  Administered 2018-07-05 – 2018-07-06 (×6): via INTRAVENOUS

## 2018-07-05 MED ORDER — MORPHINE SULFATE (PF) 4 MG/ML IV SOLN
4.0000 mg | INTRAVENOUS | Status: DC | PRN
  Administered 2018-07-05: 4 mg via INTRAVENOUS
  Filled 2018-07-05: qty 1

## 2018-07-05 MED ORDER — LIDOCAINE 2% (20 MG/ML) 5 ML SYRINGE
INTRAMUSCULAR | Status: AC
  Filled 2018-07-05: qty 5

## 2018-07-05 MED ORDER — CLONAZEPAM 1 MG PO TABS
2.0000 mg | ORAL_TABLET | Freq: Every day | ORAL | Status: DC
  Administered 2018-07-05 – 2018-07-06 (×2): 2 mg via ORAL
  Filled 2018-07-05 (×2): qty 2

## 2018-07-05 MED ORDER — ONDANSETRON HCL 4 MG/2ML IJ SOLN
INTRAMUSCULAR | Status: AC
  Filled 2018-07-05: qty 2

## 2018-07-05 MED ORDER — ONDANSETRON HCL 4 MG/2ML IJ SOLN: 4.0000 mg | Freq: Once | INTRAMUSCULAR | Status: DC | PRN

## 2018-07-05 MED ORDER — BUPROPION HCL ER (SR) 150 MG PO TB12
150.0000 mg | ORAL_TABLET | Freq: Two times a day (BID) | ORAL | Status: DC
  Administered 2018-07-05 – 2018-07-07 (×5): 150 mg via ORAL
  Filled 2018-07-05 (×5): qty 1

## 2018-07-05 MED ORDER — HYDROMORPHONE HCL 1 MG/ML IJ SOLN: 0.2500 mg | INTRAMUSCULAR | Status: DC | PRN

## 2018-07-05 MED ORDER — HYDROCODONE-ACETAMINOPHEN 10-325 MG PO TABS
1.0000 | ORAL_TABLET | Freq: Four times a day (QID) | ORAL | Status: DC | PRN
  Administered 2018-07-06 (×2): 1 via ORAL
  Filled 2018-07-05 (×2): qty 1

## 2018-07-05 MED ORDER — SUCCINYLCHOLINE CHLORIDE 200 MG/10ML IV SOSY
PREFILLED_SYRINGE | INTRAVENOUS | Status: AC
  Filled 2018-07-05: qty 10
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
## 2018-07-05 NOTE — Transfer of Care (Signed)
Immediate Anesthesia Transfer of Care Note  Patient: [name]  Procedure(s) Performed: CYSTOSCOPY WITH RIGHT URETERAL STENT PLACEMENT (Right Ureter)  Patient Location: PACU  Anesthesia Type:General  Level of Consciousness: awake, alert  and oriented  Airway & Oxygen Therapy: Patient Spontanous Breathing  Post-op Assessment: Report given to RN and Post -op Vital signs reviewed and stable  Post vital signs: Reviewed and stable  Last Vitals:  Vitals Value Taken Time  BP    Temp    Pulse 89 07/05/2018 12:19 AM  Resp 19 07/05/2018 12:19 AM  SpO2 96 % 07/05/2018 12:19 AM  Vitals shown include unvalidated device data.  Last Pain:  Vitals:   07/04/18 1916  TempSrc: Oral  PainSc: 6          Complications: No apparent anesthesia complications

## 2018-07-05 NOTE — Progress Notes (Addendum)
[facility] PROGRESS NOTE    Progress Note  [name]  CBJ:[id number] DOB: [birth year] DOA: 07/04/2018 PCP: Meryle Ready, MD     Brief Narrative:   [name] is an 62 y.o. male past medical history significant for advanced coronary artery disease, chronic kidney disease, obstructive sleep apnea hypertension comes into the hospital complaining of right flank pain came into the cardiologist's office on the day of admission for preop evaluation with catheterization schedule, when lab work showed a creatinine of 10, CT scan stone protocol showed right hydronephrosis with nephrolithiasis urology was consulted and recommended cystoscopy with right ureteral stent placement performed on on 07/05/2018  Assessment/Plan:   Postobstructive uropathy due to right nephrolithiasis: Urology was consulted who performed cystoscopy with right ureteral stent placement done on 07/05/2018. Further recommendations from urology are pending Appreciate assistance. CT renal stone shows severe left kidney atrophy  AKI (acute kidney injury) ([place]) on chronic kidney disease stage III: With a baseline creatinine around 1.9-1.6 Likely post renal, continue IV fluid hydration. Basic metabolic panel shows a mild rise in creatinine. We will repeat a basic metabolic panel in the morning.  OSA on CPAP Continue CPAP at home.  Tremor, essential, treated with propranolol  Essential hypertension: Blood pressure well controlled continue Norvasc.  Back pain, chronic Complaint of back pain.  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      DVT prophylaxis: lovenox Family Communication:none Disposition Plan/Barrier to D/C: unable to determine Code Status:     Code Status Orders  (From admission, onward)         Start     Ordered   07/05/18 0112  Full code  Continuous     07/05/18 0112        Code Status History    Date Active Date Inactive Code Status Order ID Comments User Context   11/10/2017 1751 11/11/2017 1532 Full Code [id number]  Leonie Man, MD Inpatient   06/29/2016 1510 06/30/2016 2025 Full Code [id number]  Leonie Man, MD Inpatient   06/29/2016 1510 06/29/2016 1510 Full Code [id number]  Cheryln Manly, NP Inpatient   02/21/2015 0931 02/21/2015 1616 Full Code [id number]  Martinique, Peter M, MD Inpatient   01/14/2013 0118 01/18/2013 1536 Full Code [id number]  Theressa Millard, MD Inpatient   11/28/2012 1733 11/30/2012 1639 Full Code [id number]  Isaiah Serge, NP Inpatient        IV Access:    Peripheral IV   Procedures and diagnostic studies:   Dg Cystogram  Result Date: 07/05/2018 CLINICAL DATA:  Cystoscopy and right ureteral stent placement for a 6 mm calculus at the right ureterovesical junction causing hydronephrosis on a recent CT. EXAM: Fluoroscopically guided retrograde pyelogram COMPARISON:  Abdomen and pelvis CT dated 07/04/2018. FINDINGS: Three C-arm images of the right pelvis and abdomen are submitted for interpretation. These demonstrate retrograde opacification of the right ureter with placement of a right ureteral stent. The stent appears to be in satisfactory position. No visible filling defects on these images. IMPRESSION: Satisfactory position of the right ureteral stent. Electronically Signed   By: Claudie Revering M.D.   On: 07/05/2018 08:36   Ct Renal Stone Study  Result Date: 07/04/2018 CLINICAL DATA:  Right flank pain.  Acute renal failure. EXAM: CT ABDOMEN AND PELVIS WITHOUT CONTRAST TECHNIQUE: Multidetector CT imaging of the abdomen and pelvis was performed following the standard protocol without IV contrast. COMPARISON:  None. FINDINGS:  Lower chest: No acute abnormality. Hepatobiliary: No focal liver abnormality is seen. No gallstones, gallbladder wall thickening, or biliary dilatation. Pancreas: Unremarkable. No pancreatic ductal dilatation or  surrounding inflammatory changes. Spleen: Normal in size without focal abnormality. Adrenals/Urinary Tract: Adrenal glands appear normal. Severe left renal atrophy is noted. Right nephrolithiasis is noted with the largest calculus measuring 17 mm in midpole calyx. Mild right hydroureteronephrosis is noted secondary to 6 mm calculus at right ureterovesical junction. Urinary bladder is otherwise unremarkable. Stomach/Bowel: Stomach is within normal limits. Appendix appears normal. No evidence of bowel wall thickening, distention, or inflammatory changes. Vascular/Lymphatic: Aortic atherosclerosis. No enlarged abdominal or pelvic lymph nodes. Reproductive: Prostate is unremarkable. Other: No abdominal wall hernia or abnormality. No abdominopelvic ascites. Musculoskeletal: Extensive postsurgical and degenerative changes are noted in the lower lumbar spine. No acute abnormality is noted. IMPRESSION: Right nephrolithiasis. Severe left renal atrophy. Mild right hydroureteronephrosis is noted secondary to 6 mm calculus at right ureterovesical junction. Aortic Atherosclerosis (ICD10-I70.0). Electronically Signed   By: Marijo Conception, M.D.   On: 07/04/2018 20:02     Medical Consultants:    None.  Anti-Infectives:   None  Subjective:    [name] patient relates his pain is significantly improved, nausea and fatigue are improving.  Objective:    Vitals:   07/05/18 0045 07/05/18 0049 07/05/18 0106 07/05/18 0618  BP: (!) 164/108 (!) 153/92 (!) 146/97 (!) 127/92  Pulse: 83 83 80 75  Resp: 18 19 16 16   Temp:  98.1 F (36.7 C) 97.9 F (36.6 C) 98 F (36.7 C)  TempSrc:    Oral  SpO2: 96% 96% 95% 96%  Weight:   117.9 kg   Height:   5\' 11"  (1.803 m)     Intake/Output Summary (Last 24 hours) at 07/05/2018 0929 Last data filed at 07/05/2018 0831 Gross per 24 hour  Intake 1525.59 ml  Output 4180 ml  Net -2654.41 ml   Filed Weights   07/05/18 0106  Weight: 117.9 kg    Exam: General  exam: In no acute distress. Respiratory system: Good air movement and clear to auscultation. Cardiovascular system: S1 & S2 heard, RRR. No JVD. Gastrointestinal system: Abdomen is nondistended, soft and nontender.  Central nervous system: Alert and oriented. No focal neurological deficits. Extremities: No pedal edema. Skin: No rashes, lesions or ulcers Psychiatry: Judgement and insight appear normal. Mood & affect appropriate.    Data Reviewed:    Labs: Basic Metabolic Panel: Recent Labs  Lab 07/04/18 1056 07/04/18 1246 07/04/18 2130  NA 133* 133* 134*  K 5.1 5.4* 4.4  CL 93* 93* 96*  CO2 15* 18* 20*  GLUCOSE 79 75 99  BUN 79* 78* 89*  CREATININE 10.83* 10.92* 12.58*  CALCIUM 9.1 9.1 9.2   GFR Estimated Creatinine Clearance: 7.9 mL/min (A) (by C-G formula based on SCr of 12.58 mg/dL (H)). Liver Function Tests: Recent Labs  Lab 07/04/18 1056 07/04/18 2130  AST 8 15  ALT 11 14  ALKPHOS 69 67  BILITOT 0.9 0.8  PROT 7.2 8.0  ALBUMIN 4.1 3.9   No results for input(s): LIPASE, AMYLASE in the last 168 hours. No results for input(s): AMMONIA in the last 168 hours. Coagulation profile No results for input(s): INR, PROTIME in the last 168 hours.  CBC: Recent Labs  Lab 07/04/18 1246  WBC 11.2*  HGB 15.1  HCT 42.9  MCV 82  PLT 344   Cardiac Enzymes: Recent Labs  Lab 07/04/18 1246  TROPONINI 0.03  BNP (last 3 results) No results for input(s): PROBNP in the last 8760 hours. CBG: No results for input(s): GLUCAP in the last 168 hours. D-Dimer: No results for input(s): DDIMER in the last 72 hours. Hgb A1c: No results for input(s): HGBA1C in the last 72 hours. Lipid Profile: Recent Labs    07/04/18 1056  CHOL 183  HDL 35*  LDLCALC 100*  TRIG 238*  CHOLHDL 5.2*   Thyroid function studies: No results for input(s): TSH, T4TOTAL, T3FREE, THYROIDAB in the last 72 hours.  Invalid input(s): FREET3 Anemia work up: No results for input(s): VITAMINB12,  FOLATE, FERRITIN, TIBC, IRON, RETICCTPCT in the last 72 hours. Sepsis Labs: Recent Labs  Lab 07/04/18 1246  WBC 11.2*   Microbiology Recent Results (from the past 240 hour(s))  Anaerobic culture     Status: None (Preliminary result)   Collection Time: 07/05/18 12:16 AM  Result Value Ref Range Status   Specimen Description URINE, RANDOM  Final   Special Requests BLADDER [place]  Final   Gram Stain   Final    WBC PRESENT,BOTH PMN AND MONONUCLEAR NO ORGANISMS SEEN CYTOSPIN SMEAR Performed at [facility], [address], [place], [place] [address]    Culture PENDING  Incomplete   Report Status PENDING  Incomplete     Medications:   . amLODipine  5 mg Oral Daily  . buPROPion  150 mg Oral BID  . clonazePAM  2 mg Oral QHS  . propranolol  20 mg Oral TID  . ticagrelor  90 mg Oral BID   Continuous Infusions: . sodium chloride 125 mL/hr at 07/05/18 0831      LOS: 0 days   Charlynne Cousins  [facility] Pager [phone]  *Please refer to [email or web address], password [email or web address] to get updated schedule on who will round on this patient, as hospitalists switch teams weekly. If 7PM-7AM, please contact night-coverage at [email or web address], password [email or web address] for any overnight needs.  07/05/2018, 9:29 AM

## 2018-07-05 NOTE — Progress Notes (Signed)
Patient to self administer CPAP when ready for bed.  FFM with 9 cmH20 used as per patient's home regimen.  Patient wears CPAP at home and is familiar with equipment and procedure.  Patient encouraged to contact RT with any issues.

## 2018-07-05 NOTE — Progress Notes (Signed)
CPAP setup for patient, however, patient not ready to go on at this time. Told patient to have the RN to call RT when he was ready for it to be put on. RN at bedside.

## 2018-07-05 NOTE — Brief Op Note (Signed)
07/04/2018 - 07/05/2018  12:04 AM  PATIENT:  [name]  62 y.o. male  PRE-OPERATIVE DIAGNOSIS:  kidney stones  POST-OPERATIVE DIAGNOSIS:  kidney stones  PROCEDURE:  Procedure(s): CYSTOSCOPY WITH RIGHT URETERAL STENT PLACEMENT (Right)  SURGEON:  Surgeon(s) and Role:    * Alexis Frock, MD - Primary  PHYSICIAN ASSISTANT:   ASSISTANTS: Basilio Cairo MD   ANESTHESIA:   general  EBL:  Minimal    BLOOD ADMINISTERED:none  DRAINS: Foley to gravity   LOCAL MEDICATIONS USED:  Amount: 0 ml  SPECIMEN:  Source of Specimen:  urine  DISPOSITION OF SPECIMEN:  gram stain and CX  COUNTS:  YES  TOURNIQUET:  * No tourniquets in log *  DICTATION: .Other Dictation: Dictation Number [id number]  PLAN OF CARE: Admit to inpatient   PATIENT DISPOSITION:  PACU - hemodynamically stable.   Delay start of Pharmacological VTE agent (>24hrs) due to surgical blood loss or risk of bleeding: yes

## 2018-07-05 NOTE — Op Note (Signed)
NAME: [name] MEDICAL RECORD IT:[id number] ACCOUNT [id number] DATE OF BIRTH:1955/11/02 FACILITY: [facility] LOCATION: [facility] PHYSICIAN:Zeah Germano, MD  OPERATIVE REPORT  DATE OF PROCEDURE:  07/05/2018  PREOPERATIVE DIAGNOSIS:  Right ureteral stone, left renal atrophy, acute renal failure.  PROCEDURE: 1.  Cystoscopy, right retrograde pyelogram, interpretation. 2.  Insertion of right ureteral stent, 6 x 26 Contour, no tether.  ESTIMATED BLOOD LOSS:  Nil.  COMPLICATIONS:  None.  SPECIMENS:  Urine for Gram stain and culture.  ASSISTANT:  Arvil Chaco, MD  FINDINGS: 1.  Mild bladder trabeculation. 2.  Mild to moderate right hydronephrosis with tortuosity. 3.  Successful placement of right ureteral stent proximal in the renal pelvis, distal end in urinary bladder. 4.  Significant hydronephrotic drip of proteinaceous appearing urine following stent placement.  INDICATIONS:  The patient is a pleasant, but quite comorbid 62 year old gentleman with history of extensive  cardiovascular disease, status post MI times many and multiple cardiovascular procedures.  He was actually tentatively scheduled to undergo a  repeat cardiac catheterization in the near future but was found on workup of this with routine labs to have acute renal failure.  He was referred to the emergency room where axial imaging revealed a right ureteral stone and a functionally solitary  kidney.  Options were discussed including recommended path of emergent renal decompression.  Stenting versus nephrostomy discussed, stenting clearly preferred given the patient on a significant antiplatelet therapy.  Informed consent was obtained and  placed in medical record.  DESCRIPTION OF PROCEDURE:  The patient being [name] and verified using right ureteral stent placement was confirmed.  Procedure timeout was performed.  Intravenous antibiotics administered.  General anesthesia induced.  The patient was placed   into a low lithotomy position and sterile field was created by prepping the patient's penis, perineum and proximal thighs using iodine.  Cystourethroscopy was performed using a rigid cystoscope with offset lens.  Inspection of the anterior and posterior  urethra was unremarkable.  Inspection of bladder revealed some mild trabeculation.  Left ureteral orifice was unremarkable.  Right ureteral orifice was somewhat engorged, consistent with known distal stone.  The stone was not visible.  The right ureteral  orifice was cannulated with a 6 [place] renal catheter and right pyelogram was obtained.  Right retrograde pyelogram demonstrated a single right ureter single system right kidney.  There was moderate tortuosity, mild hydronephrosis.  A 0.038 ZIPwire was advanced to lower pole of which a new 6 x 26 Contour-type stent was placed using  cystoscopic and fluoroscopic guidance.  Good proximal and distal points were noted.  There was significant hydronephrotic drip of quite proteinaceous appearing urine seen around and through the distal end of the stent.  A sample of this was obtained and  sent for Gram stain and culture.  A Foley catheter was placed in the urethra to straight drain for accurate urine output monitoring given his renal failure and the procedure terminated.    The patient tolerated the procedure well.  No immediate complications.  The patient was taken to [facility] in stable condition with plan for medical admission.  AN/NUANCE  D:07/05/2018 T:07/05/2018 JOB:[id number]

## 2018-07-05 NOTE — Anesthesia Postprocedure Evaluation (Signed)
Anesthesia Post Note  Patient: [name]  Procedure(s) Performed: CYSTOSCOPY WITH RIGHT URETERAL STENT PLACEMENT (Right Ureter)     Patient location during evaluation: PACU Anesthesia Type: General Level of consciousness: awake and alert Pain management: pain level controlled Vital Signs Assessment: post-procedure vital signs reviewed and stable Respiratory status: spontaneous breathing, nonlabored ventilation, respiratory function stable and patient connected to nasal cannula oxygen Cardiovascular status: blood pressure returned to baseline and stable Postop Assessment: no apparent nausea or vomiting Anesthetic complications: no    Last Vitals:  Vitals:   07/05/18 0049 07/05/18 0106  BP: (!) 153/92 (!) 146/97  Pulse: 83 80  Resp: 19 16  Temp: 36.7 C 36.6 C  SpO2: 96% 95%    Last Pain:  Vitals:   07/05/18 0106  TempSrc:   PainSc: 0-No pain                 [name]

## 2018-07-06 ENCOUNTER — Ambulatory Visit (HOSPITAL_COMMUNITY): Admission: RE | Admit: 2018-07-06 | Payer: Medicare Other | Source: Ambulatory Visit | Admitting: Cardiovascular Disease

## 2018-07-06 ENCOUNTER — Encounter (HOSPITAL_COMMUNITY): Admission: RE | Payer: Self-pay | Source: Ambulatory Visit

## 2018-07-06 LAB — BASIC METABOLIC PANEL
Anion gap: 11 (ref 5–15)
BUN: 45 mg/dL — AB (ref 8–23)
CHLORIDE: 105 mmol/L (ref 98–111)
CO2: 22 mmol/L (ref 22–32)
Calcium: 9.2 mg/dL (ref 8.9–10.3)
Creatinine, Ser: 4.65 mg/dL — ABNORMAL HIGH (ref 0.61–1.24)
GFR calc Af Amer: 14 mL/min — ABNORMAL LOW (ref >60)
GFR calc non Af Amer: 12 mL/min — ABNORMAL LOW (ref >60)
GLUCOSE: 94 mg/dL (ref 70–99)
POTASSIUM: 4.9 mmol/L (ref 3.5–5.1)
SODIUM: 138 mmol/L (ref 135–145)

## 2018-07-06 SURGERY — LEFT HEART CATH AND CORS/GRAFTS ANGIOGRAPHY
Anesthesia: LOCAL

## 2018-07-06 NOTE — Treatment Plan (Addendum)
Reviewed chart this AM Vitals stable Post obstructive diuresis yesterday - ~8L UOP Electrolytes wnl this AM.  Creatinine significantly improved to ~4.8 from peak of 12  Appreciate medical team's care Agree with daily lab work and close monitoring of fluid balance, may need additional 1/2NS repletion if continued high volume diuresis Foley catheter per primary team - accurate I/Os for now  Basilio Cairo, MD Urology   I have seen and examined the patient and agree with Dr. Wayland Denis plan. [place],  S: 1 - Right Ureteral  / Renal Stones - s/p emergent ureteral stent placement 9/25 early AM for Rt 23mm UVJ stone with mod hydro as well as 52mm Rt renal stone by Er CT 06/2018 on eval acute renal failure. H/o prior stones managed medically.   2 - Atrophic Left Kidney - severe left renal atrophy by CT 06/2018 on eval acute renal failure, no left hydro.  3 - Acute on Chronic Renal Failure - Cr 10 with K 5.4 by ER labs 06/2018 up from baseline Cr of 1.9 year prior. Atrophic left kidney and right renal obsruction as per above.  Today " [name]" is feeling much improved. Resolved prior flank pain, much less malaise. Very large diuresis as anticipated.  O: NAD, AO x3, daughter at bedside HR 90s by bedside monitor, regualr Stable truncal obesity No CVAT Foley with very light pink urine that is non-foul.  A/P:  Doing well with now post-obstructive diuresis and return of GFR after resolution of sotne obstruction in functionally solitary kidney.   OK for DC home and DC foley and anypoint from Urol perspective after UOP becomes more managable.  We will arrange for elective staged ureteroscopy in outpatient setting.  Please call me directly with questions anytime.  I will follow him PRN while in house at this point.

## 2018-07-06 NOTE — Progress Notes (Signed)
Patient refuses CPAP. Patient states he could not keep it on last night was uncomfortable. RT will continue to monitor.

## 2018-07-06 NOTE — Progress Notes (Signed)
[facility] PROGRESS NOTE    Progress Note  [name]  JKK:[id number] DOB: 26-Feb-1956 DOA: 07/04/2018 PCP: Meryle Ready, MD     Brief Narrative:   [name] is an 62 y.o. male past medical history significant for advanced coronary artery disease, chronic kidney disease, obstructive sleep apnea hypertension comes into the hospital complaining of right flank pain came into the cardiologist's office on the day of admission for preop evaluation with catheterization schedule, when lab work showed a creatinine of 10, CT scan stone protocol showed right hydronephrosis with nephrolithiasis urology was consulted and recommended cystoscopy with right ureteral stent placement performed on on 07/05/2018  Assessment/Plan:   Postobstructive uropathy due to right nephrolithiasis: Urology was consulted who performed cystoscopy with right ureteral stent placement done on 07/05/2018. Appreciate assistance. CT renal stone shows severe left kidney atrophy With postobstructive diureses of about 8 L in 24-hour period.  We will continue IV fluids.  AKI (acute kidney injury) ([place]) on chronic kidney disease stage III: With a baseline creatinine around 7.1-6.9 Metabolic panel is pending, likely post renal continue IV fluids  OSA on CPAP Continue CPAP at home.  Tremor, essential, treated with propranolol  Essential hypertension: Blood pressure well controlled continue Norvasc.  Back pain, chronic Complaint of back pain.  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      DVT prophylaxis: lovenox Family Communication:none Disposition Plan/Barrier to D/C: unable to determine Code Status:     Code Status Orders  (From admission, onward)         Start     Ordered   07/05/18 0112  Full code  Continuous     07/05/18 0112        Code Status History    Date Active Date Inactive  Code Status Order ID Comments User Context   11/10/2017 1751 11/11/2017 1532 Full Code [id number]  Leonie Man, MD Inpatient   06/29/2016 1510 06/30/2016 2025 Full Code [id number]  Leonie Man, MD Inpatient   06/29/2016 1510 06/29/2016 1510 Full Code [id number]  Cheryln Manly, NP Inpatient   02/21/2015 0931 02/21/2015 1616 Full Code [id number]  [place], Peter M, MD Inpatient   01/14/2013 0118 01/18/2013 1536 Full Code [id number]  Theressa Millard, MD Inpatient   11/28/2012 1733 11/30/2012 1639 Full Code [id number]  Isaiah Serge, NP Inpatient        IV Access:    Peripheral IV   Procedures and diagnostic studies:   Dg Cystogram  Result Date: 07/05/2018 CLINICAL DATA:  Cystoscopy and right ureteral stent placement for a 6 mm calculus at the right ureterovesical junction causing hydronephrosis on a recent CT. EXAM: Fluoroscopically guided retrograde pyelogram COMPARISON:  Abdomen and pelvis CT dated 07/04/2018. FINDINGS: Three C-arm images of the right pelvis and abdomen are submitted for interpretation. These demonstrate retrograde opacification of the right ureter with placement of a right ureteral stent. The stent appears to be in satisfactory position. No visible filling defects on these images. IMPRESSION: Satisfactory position of the right ureteral stent. Electronically Signed   By: Claudie Revering M.D.   On: 07/05/2018 08:36   Ct Renal Stone Study  Result Date: 07/04/2018 CLINICAL DATA:  Right flank pain.  Acute renal failure. EXAM: CT ABDOMEN AND PELVIS WITHOUT CONTRAST TECHNIQUE: Multidetector CT imaging of the abdomen and pelvis was performed following the standard protocol without IV contrast. COMPARISON:  None. FINDINGS: Lower chest: No acute abnormality.  Hepatobiliary: No focal liver abnormality is seen. No gallstones, gallbladder wall thickening, or biliary dilatation. Pancreas: Unremarkable. No pancreatic ductal dilatation or surrounding inflammatory changes. Spleen: Normal in size  without focal abnormality. Adrenals/Urinary Tract: Adrenal glands appear normal. Severe left renal atrophy is noted. Right nephrolithiasis is noted with the largest calculus measuring 17 mm in midpole calyx. Mild right hydroureteronephrosis is noted secondary to 6 mm calculus at right ureterovesical junction. Urinary bladder is otherwise unremarkable. Stomach/Bowel: Stomach is within normal limits. Appendix appears normal. No evidence of bowel wall thickening, distention, or inflammatory changes. Vascular/Lymphatic: Aortic atherosclerosis. No enlarged abdominal or pelvic lymph nodes. Reproductive: Prostate is unremarkable. Other: No abdominal wall hernia or abnormality. No abdominopelvic ascites. Musculoskeletal: Extensive postsurgical and degenerative changes are noted in the lower lumbar spine. No acute abnormality is noted. IMPRESSION: Right nephrolithiasis. Severe left renal atrophy. Mild right hydroureteronephrosis is noted secondary to 6 mm calculus at right ureterovesical junction. Aortic Atherosclerosis (ICD10-I70.0). Electronically Signed   By: Marijo Conception, M.D.   On: 07/04/2018 20:02     Medical Consultants:    None.  Anti-Infectives:   None  Subjective:    [name] no pain he relates he feels back to baseline..  Objective:    Vitals:   07/05/18 1300 07/05/18 1500 07/05/18 2159 07/06/18 0429  BP: (!) 160/104 137/86 (!) 146/89 (!) 149/87  Pulse: 62 74 76 64  Resp:   18 17  Temp: 97.8 F (36.6 C)  98.1 F (36.7 C) 98.9 F (37.2 C)  TempSrc: Oral Oral Oral Oral  SpO2: 100%  94% 99%  Weight:      Height:        Intake/Output Summary (Last 24 hours) at 07/06/2018 0938 Last data filed at 07/06/2018 7893 Gross per 24 hour  Intake 1884.91 ml  Output 8100 ml  Net -6215.09 ml   Filed Weights   07/05/18 0106  Weight: 117.9 kg    Exam: General exam: In no acute distress. Respiratory system: Good air movement and clear to auscultation. Cardiovascular  system: S1 & S2 heard, RRR. No JVD. Gastrointestinal system: Abdomen is nondistended, soft and nontender.  Central nervous system: Alert and oriented. No focal neurological deficits. Extremities: No pedal edema. Skin: No rashes, lesions or ulcers Psychiatry: Judgement and insight appear normal. Mood & affect appropriate.    Data Reviewed:    Labs: Basic Metabolic Panel: Recent Labs  Lab 07/04/18 1056 07/04/18 1246 07/04/18 2130  NA 133* 133* 134*  K 5.1 5.4* 4.4  CL 93* 93* 96*  CO2 15* 18* 20*  GLUCOSE 79 75 99  BUN 79* 78* 89*  CREATININE 10.83* 10.92* 12.58*  CALCIUM 9.1 9.1 9.2   GFR Estimated Creatinine Clearance: 7.9 mL/min (A) (by C-G formula based on SCr of 12.58 mg/dL (H)). Liver Function Tests: Recent Labs  Lab 07/04/18 1056 07/04/18 2130  AST 8 15  ALT 11 14  ALKPHOS 69 67  BILITOT 0.9 0.8  PROT 7.2 8.0  ALBUMIN 4.1 3.9   No results for input(s): LIPASE, AMYLASE in the last 168 hours. No results for input(s): AMMONIA in the last 168 hours. Coagulation profile No results for input(s): INR, PROTIME in the last 168 hours.  CBC: Recent Labs  Lab 07/04/18 1246  WBC 11.2*  HGB 15.1  HCT 42.9  MCV 82  PLT 344   Cardiac Enzymes: Recent Labs  Lab 07/04/18 1246  TROPONINI 0.03   BNP (last 3 results) No results for input(s): PROBNP in the  last 8760 hours. CBG: No results for input(s): GLUCAP in the last 168 hours. D-Dimer: No results for input(s): DDIMER in the last 72 hours. Hgb A1c: No results for input(s): HGBA1C in the last 72 hours. Lipid Profile: Recent Labs    07/04/18 1056  CHOL 183  HDL 35*  LDLCALC 100*  TRIG 238*  CHOLHDL 5.2*   Thyroid function studies: No results for input(s): TSH, T4TOTAL, T3FREE, THYROIDAB in the last 72 hours.  Invalid input(s): FREET3 Anemia work up: No results for input(s): VITAMINB12, FOLATE, FERRITIN, TIBC, IRON, RETICCTPCT in the last 72 hours. Sepsis Labs: Recent Labs  Lab 07/04/18 1246    WBC 11.2*   Microbiology Recent Results (from the past 240 hour(s))  Anaerobic culture     Status: None (Preliminary result)   Collection Time: 07/05/18 12:16 AM  Result Value Ref Range Status   Specimen Description URINE, RANDOM  Final   Special Requests BLADDER [place]  Final   Gram Stain   Final    WBC PRESENT,BOTH PMN AND MONONUCLEAR NO ORGANISMS SEEN CYTOSPIN SMEAR Performed at [facility], [address] [place], [place] [address]    Culture PENDING  Incomplete   Report Status PENDING  Incomplete     Medications:   . amLODipine  5 mg Oral Daily  . buPROPion  150 mg Oral BID  . clonazePAM  2 mg Oral QHS  . propranolol  20 mg Oral TID  . ticagrelor  90 mg Oral BID   Continuous Infusions: . sodium chloride 125 mL/hr at 07/06/18 0824      LOS: 1 day   [facility] Pager [phone]  *Please refer to [email or web address], password [email or web address] to get updated schedule on who will round on this patient, as hospitalists switch teams weekly. If 7PM-7AM, please contact night-coverage at [email or web address], password [email or web address] for any overnight needs.  07/06/2018, 9:38 AM

## 2018-07-07 LAB — BASIC METABOLIC PANEL
Anion gap: 9 (ref 5–15)
BUN: 37 mg/dL — ABNORMAL HIGH (ref 8–23)
CHLORIDE: 106 mmol/L (ref 98–111)
CO2: 23 mmol/L (ref 22–32)
Calcium: 9.4 mg/dL (ref 8.9–10.3)
Creatinine, Ser: 3.22 mg/dL — ABNORMAL HIGH (ref 0.61–1.24)
GFR, EST AFRICAN AMERICAN: 22 mL/min — AB (ref >60)
GFR, EST NON AFRICAN AMERICAN: 19 mL/min — AB (ref >60)
Glucose, Bld: 99 mg/dL (ref 70–99)
POTASSIUM: 4.8 mmol/L (ref 3.5–5.1)
SODIUM: 138 mmol/L (ref 135–145)

## 2018-07-07 MED ORDER — LOSARTAN POTASSIUM 100 MG PO TABS: 100.0000 mg | ORAL_TABLET | Freq: Every day | ORAL | 0 refills | Status: DC

## 2018-07-07 NOTE — Discharge Summary (Signed)
Physician Discharge Summary  [name] UMP:[id number] DOB: 10/18/1955 DOA: 07/04/2018  PCP: Meryle Ready, MD  Admit date: 07/04/2018 Discharge date: 07/07/2018  Admitted From: home Disposition:  Home  Recommendations for Outpatient Follow-up:  1. Follow up with Urology in 1-2 weeks 2. Please obtain BMP/CBC in one week   Home Health:No Equipment/Devices:none  Discharge Condition:stable CODE STATUS:full Diet recommendation: Heart Healthy   Brief/Interim Summary:  62 y.o. male past medical history significant for advanced coronary artery disease, chronic kidney disease, obstructive sleep apnea hypertension comes into the hospital complaining of right flank pain came into the cardiologist's office on the day of admission for preop evaluation with catheterization schedule, when lab work showed a creatinine of 10, CT scan stone protocol showed right hydronephrosis with nephrolithiasis urology was consulted and recommended cystoscopy with right ureteral stent placement performed on on 07/05/2018  Discharge Diagnoses:  Principal Problem:   AKI (acute kidney injury) ([place]) Active Problems:   OSA on CPAP   Tremor, essential, treated with propranolol   Essential hypertension   Back pain, chronic   CAD-CABG 2002 (LIMA-LAD, VG-ramus intermediate, SVG-OM, SVG-PDA) s/p Cath 11/29/12-total occulsion of SVG-OM, EF of 45%   Right nephrolithiasis   Hydroureter on right   CKD (chronic kidney disease)  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 Start on aggressive and IV fluid for postobstructive diuresis. Urology reevaluated the patient the next day and the patient was much improved they recommended to follow-up with them as an outpatient for stent removal and probably  lithotripsy as an outpatient.  AKI (acute kidney injury) ([place]) on chronic kidney disease stage III: With a baseline creatinine around 1.9-1.6 Likely post renal after IV fluid hydration his creatinine came down to 3.2. We will continue to hold nephrotoxic drug for 2 weeks (losartan)  OSA on CPAP Continue CPAP at home.  Tremor, essential, treated with propranolol  Essential hypertension: Blood pressure well controlled continue Norvasc and propranolol.  Back pain, chronic Will no changes made to his medication.  CAD-CABG 2002 (LIMA-LAD, VG-ramus intermediate, SVG-OM, SVG-PDA) s/p Cath 11/29/12-total occulsion  of SVG-OM, EF of 45% No changes made to his medication.      Discharge Instructions  Discharge Instructions    Diet - low sodium heart healthy   Complete by:  As directed    Increase activity slowly   Complete by:  As directed      Allergies as of 07/07/2018      Reactions   Crestor [rosuvastatin] Other (See Comments)   REACTION: Muscle aches   Lipitor [atorvastatin] Cough   ? cough   Lisinopril Cough   CHANGE TO LOSARTAN   Zocor [simvastatin] Other (See Comments)   REACTION: Muscle aches      Medication List    TAKE these medications   amLODipine 5 MG tablet Commonly known as:  NORVASC Take 1 tablet (5 mg total) by mouth daily.   aspirin EC 81 MG tablet Take 1 tablet (81 mg total) by mouth daily.   BRILINTA 90 MG Tabs tablet Generic drug:  ticagrelor TAKE 1 TABLET BY MOUTH TWICE A DAY What changed:  how much to take   buPROPion 150 MG 12 hr tablet Commonly known as:  WELLBUTRIN SR Take 150 mg by mouth 2 (two) times daily.   clonazePAM 2 MG tablet Commonly known  as:  KLONOPIN Take 1 tablet (2 mg total) by mouth at bedtime.   cyclobenzaprine 10 MG tablet Commonly known as:  FLEXERIL Take 10 mg by mouth 3 (three) times daily as needed for muscle spasms.   Evolocumab 140 MG/ML Soaj Inject 140 mg into the skin every 14 (fourteen) days.    HYDROcodone-acetaminophen 7.5-325 MG tablet Commonly known as:  NORCO Take 1 tablet by mouth every 4 (four) hours as needed for moderate pain.   losartan 100 MG tablet Commonly known as:  COZAAR Take 1 tablet (100 mg total) by mouth daily. Start taking on:  07/20/2018 What changed:  These instructions start on 07/20/2018. If you are unsure what to do until then, ask your doctor or other care provider.   nitroGLYCERIN 0.4 MG SL tablet Commonly known as:  NITROSTAT PLACE 1TAB UNDER TONGUE EVERY 5MIN FOR 3DOSES AS NEEDED FOR CHEST PAIN What changed:    how much to take  how to take this  when to take this  reasons to take this  additional instructions   propranolol 20 MG tablet Commonly known as:  INDERAL Take 1 tablet (20 mg total) by mouth 3 (three) times daily.       Allergies  Allergen Reactions  . Crestor [Rosuvastatin] Other (See Comments)    REACTION: Muscle aches  . Lipitor [Atorvastatin] Cough    ? cough  . Lisinopril Cough    CHANGE TO LOSARTAN  . Zocor [Simvastatin] Other (See Comments)    REACTION: Muscle aches    Consultations: Urology  Procedures/Studies: Dg Cystogram  Result Date: 07/05/2018 CLINICAL DATA:  Cystoscopy and right ureteral stent placement for a 6 mm calculus at the right ureterovesical junction causing hydronephrosis on a recent CT. EXAM: Fluoroscopically guided retrograde pyelogram COMPARISON:  Abdomen and pelvis CT dated 07/04/2018. FINDINGS: Three C-arm images of the right pelvis and abdomen are submitted for interpretation. These demonstrate retrograde opacification of the right ureter with placement of a right ureteral stent. The stent appears to be in satisfactory position. No visible filling defects on these images. IMPRESSION: Satisfactory position of the right ureteral stent. Electronically Signed   By: Claudie Revering M.D.   On: 07/05/2018 08:36   Ct Renal Stone Study  Result Date: 07/04/2018 CLINICAL DATA:  Right flank pain.   Acute renal failure. EXAM: CT ABDOMEN AND PELVIS WITHOUT CONTRAST TECHNIQUE: Multidetector CT imaging of the abdomen and pelvis was performed following the standard protocol without IV contrast. COMPARISON:  None. FINDINGS: Lower chest: No acute abnormality. Hepatobiliary: No focal liver abnormality is seen. No gallstones, gallbladder wall thickening, or biliary dilatation. Pancreas: Unremarkable. No pancreatic ductal dilatation or surrounding inflammatory changes. Spleen: Normal in size without focal abnormality. Adrenals/Urinary Tract: Adrenal glands appear normal. Severe left renal atrophy is noted. Right nephrolithiasis is noted with the largest calculus measuring 17 mm in midpole calyx. Mild right hydroureteronephrosis is noted secondary to 6 mm calculus at right ureterovesical junction. Urinary bladder is otherwise unremarkable. Stomach/Bowel: Stomach is within normal limits. Appendix appears normal. No evidence of bowel wall thickening, distention, or inflammatory changes. Vascular/Lymphatic: Aortic atherosclerosis. No enlarged abdominal or pelvic lymph nodes. Reproductive: Prostate is unremarkable. Other: No abdominal wall hernia or abnormality. No abdominopelvic ascites. Musculoskeletal: Extensive postsurgical and degenerative changes are noted in the lower lumbar spine. No acute abnormality is noted. IMPRESSION: Right nephrolithiasis. Severe left renal atrophy. Mild right hydroureteronephrosis is noted secondary to 6 mm calculus at right ureterovesical junction. Aortic Atherosclerosis (ICD10-I70.0). Electronically Signed   By: Jeneen Rinks  Murlean Caller, M.D.   On: 07/04/2018 20:02     Subjective: Complaints feels great.  Discharge Exam: Vitals:   07/06/18 2032 07/07/18 0516  BP: (!) 138/91 (!) 152/93  Pulse: 73 66  Resp: 19 18  Temp: 98.7 F (37.1 C) (!) 97.5 F (36.4 C)  SpO2: 100% 97%   Vitals:   07/06/18 0429 07/06/18 1323 07/06/18 2032 07/07/18 0516  BP: (!) 149/87 (!) 143/88 (!) 138/91 (!)  152/93  Pulse: 64 63 73 66  Resp: 17 18 19 18   Temp: 98.9 F (37.2 C) 98.3 F (36.8 C) 98.7 F (37.1 C) (!) 97.5 F (36.4 C)  TempSrc: Oral Axillary Oral Oral  SpO2: 99% 100% 100% 97%  Weight:      Height:        General: Pt is alert, awake, not in acute distress Cardiovascular: RRR, S1/S2 +, no rubs, no gallops Respiratory: CTA bilaterally, no wheezing, no rhonchi Abdominal: Soft, NT, ND, bowel sounds + Extremities: no edema, no cyanosis    The results of significant diagnostics from this hospitalization (including imaging, microbiology, ancillary and laboratory) are listed below for reference.     Microbiology: Recent Results (from the past 240 hour(s))  Anaerobic culture     Status: None (Preliminary result)   Collection Time: 07/05/18 12:16 AM  Result Value Ref Range Status   Specimen Description URINE, RANDOM  Final   Special Requests BLADDER [place]  Final   Gram Stain   Final    WBC PRESENT,BOTH PMN AND MONONUCLEAR NO ORGANISMS SEEN CYTOSPIN SMEAR Performed at [facility], [address] [place], [place] [address]    Culture   Final    NO ANAEROBES ISOLATED; CULTURE IN PROGRESS FOR 5 DAYS   Report Status PENDING  Incomplete     Labs: BNP (last 3 results) No results for input(s): BNP in the last 8760 hours. Basic Metabolic Panel: Recent Labs  Lab 07/04/18 1056 07/04/18 1246 07/04/18 2130 07/06/18 0957 07/07/18 0639  NA 133* 133* 134* 138 138  K 5.1 5.4* 4.4 4.9 4.8  CL 93* 93* 96* 105 106  CO2 15* 18* 20* 22 23  GLUCOSE 79 75 99 94 99  BUN 79* 78* 89* 45* 37*  CREATININE 10.83* 10.92* 12.58* 4.65* 3.22*  CALCIUM 9.1 9.1 9.2 9.2 9.4   Liver Function Tests: Recent Labs  Lab 07/04/18 1056 07/04/18 2130  AST 8 15  ALT 11 14  ALKPHOS 69 67  BILITOT 0.9 0.8  PROT 7.2 8.0  ALBUMIN 4.1 3.9   No results for input(s): LIPASE, AMYLASE in the last 168 hours. No results for input(s): AMMONIA in the last 168 hours. CBC: Recent Labs  Lab  07/04/18 1246  WBC 11.2*  HGB 15.1  HCT 42.9  MCV 82  PLT 344   Cardiac Enzymes: Recent Labs  Lab 07/04/18 1246  TROPONINI 0.03   BNP: Invalid input(s): POCBNP CBG: No results for input(s): GLUCAP in the last 168 hours. D-Dimer No results for input(s): DDIMER in the last 72 hours. Hgb A1c No results for input(s): HGBA1C in the last 72 hours. Lipid Profile Recent Labs    07/04/18 1056  CHOL 183  HDL 35*  LDLCALC 100*  TRIG 238*  CHOLHDL 5.2*   Thyroid function studies No results for input(s): TSH, T4TOTAL, T3FREE, THYROIDAB in the last 72 hours.  Invalid input(s): FREET3 Anemia work up No results for input(s): VITAMINB12, FOLATE, FERRITIN, TIBC, IRON, RETICCTPCT in the last 72 hours. Urinalysis  Component Value Date/Time   COLORURINE STRAW (A) 07/04/2018 1950   APPEARANCEUR CLEAR 07/04/2018 1950   LABSPEC 1.012 07/04/2018 1950   PHURINE 6.0 07/04/2018 1950   GLUCOSEU 50 (A) 07/04/2018 1950   HGBUR MODERATE (A) 07/04/2018 1950   BILIRUBINUR NEGATIVE 07/04/2018 1950   KETONESUR NEGATIVE 07/04/2018 1950   PROTEINUR NEGATIVE 07/04/2018 1950   UROBILINOGEN 0.2 01/27/2009 1252   NITRITE NEGATIVE 07/04/2018 1950   LEUKOCYTESUR NEGATIVE 07/04/2018 1950   Sepsis Labs Invalid input(s): PROCALCITONIN,  WBC,  LACTICIDVEN Microbiology Recent Results (from the past 240 hour(s))  Anaerobic culture     Status: None (Preliminary result)   Collection Time: 07/05/18 12:16 AM  Result Value Ref Range Status   Specimen Description URINE, RANDOM  Final   Special Requests BLADDER [place]  Final   Gram Stain   Final    WBC PRESENT,BOTH PMN AND MONONUCLEAR NO ORGANISMS SEEN CYTOSPIN SMEAR Performed at [facility], [place] [address], [place], [place] [address]    Culture   Final    NO ANAEROBES ISOLATED; CULTURE IN PROGRESS FOR 5 DAYS   Report Status PENDING  Incomplete     Time coordinating discharge: 45 minutes  SIGNED:   Charlynne Cousins, MD  [facility] 07/07/2018, 8:47 AM Pager   If 7PM-7AM, please contact night-coverage [email or web address] Password [email or web address]

## 2018-07-10 LAB — ANAEROBIC CULTURE

## 2018-07-11 ENCOUNTER — Telehealth: Payer: Self-pay | Admitting: Cardiology

## 2018-07-11 NOTE — Telephone Encounter (Signed)
   Primary Cardiologist: [name], MD  Chart reviewed as part of pre-operative protocol coverage. The recently seen by Almyra Deforest for chest pain a week ago. Plan was for cath however cancelled due to renal function. Will have Dr. Ellyn Hack to review.   [place], [place] 07/11/2018, 4:20 PM

## 2018-07-11 NOTE — Telephone Encounter (Signed)
° °  ° °  [facility] Pre-operative Risk Assessment    Request for surgical clearance:  1. What type of surgery is being performed? REMOVE KIDNEY STONES  2. When is this surgery scheduled? 07/21/18  3. What type of clearance is required (medical clearance vs. Pharmacy clearance to hold med vs. Both)? BOTH  4. Are there any medications that need to be held prior to surgery and how long?BRILINTA 5 DAYS  5. Practice name and name of physician performing surgery? [facility], DR MANNY  6. What is your office phone number [phone]   7.   What is your office fax number [phone]  8.   Anesthesia type (None, local, MAC, general) ? [place] 07/11/2018, 4:01 PM  _________________________________________________________________   (provider comments below)

## 2018-07-12 ENCOUNTER — Telehealth: Payer: Self-pay | Admitting: Physician Assistant

## 2018-07-12 ENCOUNTER — Other Ambulatory Visit: Payer: Self-pay | Admitting: Urology

## 2018-07-12 NOTE — Telephone Encounter (Signed)
Spoke with pt. appt scheduled with Hao Meng,PA for 10/23 @ 2pm. Pt aware of appt and voiced appreciation for the call.

## 2018-07-12 NOTE — Telephone Encounter (Signed)
Talked to him on the phone, his symptom resolved after urology procedure. He has more urology procedure coming up. We likely will receive cardiac clearance request. Please arrange 2-3 weeks followup to check on his symptom. Per patient, the symptom he had prior to last office visit completely resolved after recent hospitalization so I question whether his previous symptom was cardiac vs renal.

## 2018-07-12 NOTE — Telephone Encounter (Signed)
New Message   Patient is calling because he was to have a heart cath but it was cancelled because he had to have surgery. He wants to know when he should have his heart cath rescheduled. Please call to discuss.

## 2018-07-12 NOTE — Telephone Encounter (Signed)
New message:       Selita from [facility] is calling and states the pt does not have to hold his Brilinta for this procedure.

## 2018-07-13 ENCOUNTER — Encounter (HOSPITAL_COMMUNITY)
Admission: RE | Admit: 2018-07-13 | Discharge: 2018-07-13 | Disposition: A | Discharge status: Home / Self Care | Payer: Medicare Other | Source: Ambulatory Visit | Attending: Urology | Admitting: Urology

## 2018-07-13 ENCOUNTER — Other Ambulatory Visit: Payer: Self-pay

## 2018-07-13 ENCOUNTER — Encounter (HOSPITAL_COMMUNITY): Payer: Self-pay

## 2018-07-13 ENCOUNTER — Other Ambulatory Visit: Payer: Medicare Other

## 2018-07-13 DIAGNOSIS — Z01818 Encounter for other preprocedural examination: Secondary | ICD-10-CM | POA: Diagnosis not present

## 2018-07-13 DIAGNOSIS — N200 Calculus of kidney: Secondary | ICD-10-CM | POA: Diagnosis not present

## 2018-07-13 LAB — BASIC METABOLIC PANEL
Anion gap: 9 (ref 5–15)
BUN: 26 mg/dL — AB (ref 8–23)
CALCIUM: 10.3 mg/dL (ref 8.9–10.3)
CO2: 24 mmol/L (ref 22–32)
Chloride: 105 mmol/L (ref 98–111)
Creatinine, Ser: 1.93 mg/dL — ABNORMAL HIGH (ref 0.61–1.24)
GFR, EST AFRICAN AMERICAN: 41 mL/min — AB (ref >60)
GFR, EST NON AFRICAN AMERICAN: 36 mL/min — AB (ref >60)
Glucose, Bld: 99 mg/dL (ref 70–99)
POTASSIUM: 4.8 mmol/L (ref 3.5–5.1)
SODIUM: 138 mmol/L (ref 135–145)

## 2018-07-13 LAB — CBC
HCT: 48.9 % (ref 39.0–52.0)
Hemoglobin: 16.5 g/dL (ref 13.0–17.0)
MCH: 28.6 pg (ref 26.0–34.0)
MCHC: 33.7 g/dL (ref 30.0–36.0)
MCV: 84.7 fL (ref 78.0–100.0)
Platelets: 419 10*3/uL — ABNORMAL HIGH (ref 150–400)
RBC: 5.77 MIL/uL (ref 4.22–5.81)
RDW: 13.5 % (ref 11.5–15.5)
WBC: 12.6 10*3/uL — ABNORMAL HIGH (ref 4.0–10.5)

## 2018-07-13 NOTE — Progress Notes (Signed)
Pt scheduled to have her PAT appointment today at 3:30 PM. Please place orders.

## 2018-07-13 NOTE — Telephone Encounter (Signed)
[name] actually saw him prior to his hospital stay.  It sounded like he was having angina, but I do not know what happened after his hospitalization.  I think [name] has talked with him.  [name] c

## 2018-07-13 NOTE — Patient Instructions (Addendum)
[name]  07/13/2018   Your procedure is scheduled on: 07-21-18     Report to [facility] Main  Entrance    Report to Admitting at 12:15 PM    Call this number if you have problems the morning of surgery [phone]    Remember: Do not eat food or drink liquids :After Midnight. You may have a Clear Liquid Diet from Midnight until 8:15 AM. After 8:15 AM, nothing until after surgery.    CLEAR LIQUID DIET   Foods Allowed                                                                     Foods Excluded  Coffee and tea, regular and decaf                             liquids that you cannot  Plain Jell-O in any flavor                                             see through such as: Fruit ices (not with fruit pulp)                                     milk, soups, orange juice  Iced Popsicles                                    All solid food Carbonated beverages, regular and diet                                    Cranberry, grape and apple juices Sports drinks like Gatorade Lightly seasoned clear broth or consume(fat free) Sugar, honey syrup  Sample Menu Breakfast                                Lunch                                     Supper Cranberry juice                    Beef broth                            Chicken broth Jell-O                                     Grape juice  Apple juice Coffee or tea                        Jell-O                                      Popsicle                                                Coffee or tea                        Coffee or tea  _____________________________________________________________________    BRUSH YOUR TEETH MORNING OF SURGERY AND RINSE YOUR MOUTH OUT, NO CHEWING GUM CANDY OR MINTS.     Take these medicines the morning of surgery with A SIP OF WATER: Bupropion (Wellbutrin), Propranolol (Inderal), and Brilinita (per your MD's instruction)                                  You may not have any metal on your body including hair pins and              piercings  Do not wear jewelry, lotions, powders, cologne or deodorant             Men may shave face and neck.   Do not bring valuables to the hospital. [place].  Contacts, dentures or bridgework may not be worn into surgery.     Patients discharged the day of surgery will not be allowed to drive home.  Name and phone number of your driver: Della Goo [phone]                Please read over the following fact sheets you were given: _____________________________________________________________________             [facility] - Preparing for Surgery Before surgery, you can play an important role.  Because skin is not sterile, your skin needs to be as free of germs as possible.  You can reduce the number of germs on your skin by washing with CHG (chlorahexidine gluconate) soap before surgery.  CHG is an antiseptic cleaner which kills germs and bonds with the skin to continue killing germs even after washing. Please DO NOT use if you have an allergy to CHG or antibacterial soaps.  If your skin becomes reddened/irritated stop using the CHG and inform your nurse when you arrive at Short Stay. Do not shave (including legs and underarms) for at least 48 hours prior to the first CHG shower.  You may shave your face/neck. Please follow these instructions carefully:  1.  Shower with CHG Soap the night before surgery and the  morning of Surgery.  2.  If you choose to wash your hair, wash your hair first as usual with your  normal  shampoo.  3.  After you shampoo, rinse your hair and body thoroughly to remove the  shampoo.  4.  Use CHG as you would any other liquid soap.  You can apply chg directly  to the skin and wash                       Gently with a scrungie or clean washcloth.  5.  Apply the CHG Soap to your body ONLY FROM  THE NECK DOWN.   Do not use on face/ open                           Wound or open sores. Avoid contact with eyes, ears mouth and genitals (private parts).                       Wash face,  Genitals (private parts) with your normal soap.             6.  Wash thoroughly, paying special attention to the area where your surgery  will be performed.  7.  Thoroughly rinse your body with warm water from the neck down.  8.  DO NOT shower/wash with your normal soap after using and rinsing off  the CHG Soap.                9.  Pat yourself dry with a clean towel.            10.  Wear clean pajamas.            11.  Place clean sheets on your bed the night of your first shower and do not  sleep with pets. Day of Surgery : Do not apply any lotions/deodorants the morning of surgery.  Please wear clean clothes to the hospital/surgery center.  FAILURE TO FOLLOW THESE INSTRUCTIONS MAY RESULT IN THE CANCELLATION OF YOUR SURGERY PATIENT SIGNATURE_________________________________  NURSE SIGNATURE__________________________________  ________________________________________________________________________

## 2018-07-13 NOTE — Progress Notes (Addendum)
07-14-18 (Epic) Cardiac Clearance in chart from Kerin Ransom, [place]  07-04-18 (Epic) EKG  06-30-16 (Epic) ECHO  07-13-18 BMP result routed to Dr. Tresa Moore for review

## 2018-07-14 NOTE — Telephone Encounter (Signed)
   Primary Cardiologist: [name], MD  Chart reviewed, patient contacted by phone today, and case reviewed with Dr Ellyn Hack by phone today. Given past medical history and time since last visit, based on ACC/AHA guidelines, [name] would be at acceptable risk for the planned procedure without further cardiovascular testing.   OK to hold Brilinta up to 3-5 days pre op if needed.   I will route this recommendation to the requesting party via Epic fax function and remove from pre-op pool.  Please call with questions.  Kerin Ransom, PA-C 07/14/2018, 12:35 PM

## 2018-07-14 NOTE — Telephone Encounter (Signed)
I spoke with Dr Ellyn Hack- if the patient is doing well he can be cleared for surgery. OK to hold Brilinta if needed (PCI with DES Jan 2019). I have called and left a message for the patient to call back.  Kerin Ransom PA-C 07/14/2018 11:51 AM

## 2018-07-17 NOTE — Telephone Encounter (Signed)
I talked to [name] last week, he says he has not had any further symptom after kidney stone was removed. I think ischemic workup will not necessarily improve his clinical outcome with kidney stone removal, I would recommend clearing the patient. Is that ok with you Dr. Ellyn Hack.

## 2018-07-17 NOTE — Telephone Encounter (Signed)
Agreed -- I think Lurena Joiner called me about this issue last week.  If no more CP - I bet he was just volume overloaded from renal failure.  [name], MD

## 2018-07-21 ENCOUNTER — Ambulatory Visit (HOSPITAL_COMMUNITY): Payer: Medicare Other

## 2018-07-21 ENCOUNTER — Encounter (HOSPITAL_COMMUNITY)
Admission: RE | Disposition: A | Discharge status: Home / Self Care | Payer: Self-pay | Source: Ambulatory Visit | Attending: Urology

## 2018-07-21 ENCOUNTER — Encounter (HOSPITAL_COMMUNITY): Payer: Self-pay

## 2018-07-21 ENCOUNTER — Ambulatory Visit (HOSPITAL_COMMUNITY): Payer: Medicare Other | Admitting: Anesthesiology

## 2018-07-21 ENCOUNTER — Ambulatory Visit (HOSPITAL_COMMUNITY)
Admission: RE | Admit: 2018-07-21 | Discharge: 2018-07-21 | Disposition: A | Discharge status: Home / Self Care | Payer: Medicare Other | Source: Ambulatory Visit | Attending: Urology | Admitting: Urology

## 2018-07-21 DIAGNOSIS — E785 Hyperlipidemia, unspecified: Secondary | ICD-10-CM | POA: Insufficient documentation

## 2018-07-21 DIAGNOSIS — Z9989 Dependence on other enabling machines and devices: Secondary | ICD-10-CM | POA: Diagnosis not present

## 2018-07-21 DIAGNOSIS — I129 Hypertensive chronic kidney disease with stage 1 through stage 4 chronic kidney disease, or unspecified chronic kidney disease: Secondary | ICD-10-CM | POA: Insufficient documentation

## 2018-07-21 DIAGNOSIS — G4733 Obstructive sleep apnea (adult) (pediatric): Secondary | ICD-10-CM | POA: Diagnosis not present

## 2018-07-21 DIAGNOSIS — F329 Major depressive disorder, single episode, unspecified: Secondary | ICD-10-CM | POA: Diagnosis not present

## 2018-07-21 DIAGNOSIS — Z79899 Other long term (current) drug therapy: Secondary | ICD-10-CM | POA: Diagnosis not present

## 2018-07-21 DIAGNOSIS — Z951 Presence of aortocoronary bypass graft: Secondary | ICD-10-CM | POA: Insufficient documentation

## 2018-07-21 DIAGNOSIS — E669 Obesity, unspecified: Secondary | ICD-10-CM | POA: Insufficient documentation

## 2018-07-21 DIAGNOSIS — N189 Chronic kidney disease, unspecified: Secondary | ICD-10-CM | POA: Insufficient documentation

## 2018-07-21 DIAGNOSIS — I252 Old myocardial infarction: Secondary | ICD-10-CM | POA: Diagnosis not present

## 2018-07-21 DIAGNOSIS — Z888 Allergy status to other drugs, medicaments and biological substances status: Secondary | ICD-10-CM | POA: Diagnosis not present

## 2018-07-21 DIAGNOSIS — M19012 Primary osteoarthritis, left shoulder: Secondary | ICD-10-CM | POA: Insufficient documentation

## 2018-07-21 DIAGNOSIS — M479 Spondylosis, unspecified: Secondary | ICD-10-CM | POA: Insufficient documentation

## 2018-07-21 DIAGNOSIS — Z955 Presence of coronary angioplasty implant and graft: Secondary | ICD-10-CM | POA: Insufficient documentation

## 2018-07-21 DIAGNOSIS — I2510 Atherosclerotic heart disease of native coronary artery without angina pectoris: Secondary | ICD-10-CM | POA: Diagnosis not present

## 2018-07-21 DIAGNOSIS — Z8249 Family history of ischemic heart disease and other diseases of the circulatory system: Secondary | ICD-10-CM | POA: Diagnosis not present

## 2018-07-21 DIAGNOSIS — Z950 Presence of cardiac pacemaker: Secondary | ICD-10-CM | POA: Insufficient documentation

## 2018-07-21 DIAGNOSIS — Z6837 Body mass index (BMI) 37.0-37.9, adult: Secondary | ICD-10-CM | POA: Diagnosis not present

## 2018-07-21 DIAGNOSIS — R001 Bradycardia, unspecified: Secondary | ICD-10-CM | POA: Diagnosis not present

## 2018-07-21 DIAGNOSIS — N132 Hydronephrosis with renal and ureteral calculous obstruction: Secondary | ICD-10-CM | POA: Insufficient documentation

## 2018-07-21 DIAGNOSIS — N3289 Other specified disorders of bladder: Secondary | ICD-10-CM | POA: Insufficient documentation

## 2018-07-21 HISTORY — PX: HOLMIUM LASER APPLICATION: SHX5852

## 2018-07-21 HISTORY — PX: CYSTOSCOPY WITH RETROGRADE PYELOGRAM, URETEROSCOPY AND STENT PLACEMENT: SHX5789

## 2018-07-21 SURGERY — CYSTOURETEROSCOPY, WITH RETROGRADE PYELOGRAM AND STENT INSERTION
Anesthesia: General | Laterality: Right

## 2018-07-21 MED ORDER — FENTANYL CITRATE (PF) 100 MCG/2ML IJ SOLN
INTRAMUSCULAR | Status: AC
  Administered 2018-07-21: 50 ug via INTRAVENOUS
  Filled 2018-07-21: qty 2

## 2018-07-21 MED ORDER — LABETALOL HCL 5 MG/ML IV SOLN
INTRAVENOUS | Status: DC | PRN
  Administered 2018-07-21 (×2): 10 mg via INTRAVENOUS

## 2018-07-21 MED ORDER — SODIUM CHLORIDE 0.9 % IV SOLN
INTRAVENOUS | Status: DC | PRN
  Administered 2018-07-21 (×2): via INTRAVENOUS

## 2018-07-21 MED ORDER — MIDAZOLAM HCL 5 MG/5ML IJ SOLN
INTRAMUSCULAR | Status: DC | PRN
  Administered 2018-07-21 (×2): 1 mg via INTRAVENOUS

## 2018-07-21 MED ORDER — FENTANYL CITRATE (PF) 250 MCG/5ML IJ SOLN
INTRAMUSCULAR | Status: AC
  Filled 2018-07-21: qty 5

## 2018-07-21 MED ORDER — METOCLOPRAMIDE HCL 5 MG/ML IJ SOLN
10.0000 mg | Freq: Once | INTRAMUSCULAR | Status: AC | PRN
  Administered 2018-07-21: 10 mg via INTRAVENOUS

## 2018-07-21 MED ORDER — ACETAMINOPHEN 10 MG/ML IV SOLN
INTRAVENOUS | Status: DC | PRN
  Administered 2018-07-21: 1000 mg via INTRAVENOUS

## 2018-07-21 MED ORDER — MEPERIDINE HCL 50 MG/ML IJ SOLN: 6.2500 mg | INTRAMUSCULAR | Status: DC | PRN

## 2018-07-21 MED ORDER — 0.9 % SODIUM CHLORIDE (POUR BTL) OPTIME
TOPICAL | Status: DC | PRN
  Administered 2018-07-21: 1000 mL

## 2018-07-21 MED ORDER — PROPOFOL 10 MG/ML IV BOLUS
INTRAVENOUS | Status: DC | PRN
  Administered 2018-07-21: 170 mg via INTRAVENOUS

## 2018-07-21 MED ORDER — OXYCODONE-ACETAMINOPHEN 5-325 MG PO TABS: 1.0000 | ORAL_TABLET | Freq: Four times a day (QID) | ORAL | 0 refills | Status: DC | PRN

## 2018-07-21 MED ORDER — SODIUM CHLORIDE 0.9 % IV SOLN
1.0000 g | INTRAVENOUS | Status: AC
  Administered 2018-07-21: 2 g via INTRAVENOUS
  Filled 2018-07-21: qty 1

## 2018-07-21 MED ORDER — ACETAMINOPHEN 10 MG/ML IV SOLN
INTRAVENOUS | Status: AC
  Filled 2018-07-21: qty 100

## 2018-07-21 MED ORDER — SODIUM CHLORIDE 0.9 % IR SOLN
Status: DC | PRN
  Administered 2018-07-21: 6000 mL via INTRAVESICAL

## 2018-07-21 MED ORDER — HYDRALAZINE HCL 20 MG/ML IJ SOLN
INTRAMUSCULAR | Status: DC | PRN
  Administered 2018-07-21: 10 mg via INTRAVENOUS

## 2018-07-21 MED ORDER — LACTATED RINGERS IV SOLN
INTRAVENOUS | Status: DC
  Administered 2018-07-21: 1000 mL via INTRAVENOUS

## 2018-07-21 MED ORDER — FENTANYL CITRATE (PF) 100 MCG/2ML IJ SOLN
25.0000 ug | INTRAMUSCULAR | Status: DC | PRN
  Administered 2018-07-21 (×2): 50 ug via INTRAVENOUS

## 2018-07-21 MED ORDER — SODIUM CHLORIDE 0.9 % IV SOLN
INTRAVENOUS | Status: DC | PRN
  Administered 2018-07-21: 20 mL

## 2018-07-21 MED ORDER — FENTANYL CITRATE (PF) 100 MCG/2ML IJ SOLN
INTRAMUSCULAR | Status: DC | PRN
  Administered 2018-07-21 (×6): 50 ug via INTRAVENOUS

## 2018-07-21 MED ORDER — LIDOCAINE HCL (CARDIAC) PF 100 MG/5ML IV SOSY
PREFILLED_SYRINGE | INTRAVENOUS | Status: DC | PRN
  Administered 2018-07-21: 50 mg via INTRAVENOUS

## 2018-07-21 MED ORDER — ONDANSETRON HCL 4 MG/2ML IJ SOLN
INTRAMUSCULAR | Status: DC | PRN
  Administered 2018-07-21: 4 mg via INTRAVENOUS

## 2018-07-21 MED ORDER — DEXAMETHASONE SODIUM PHOSPHATE 10 MG/ML IJ SOLN
INTRAMUSCULAR | Status: DC | PRN
  Administered 2018-07-21: 10 mg via INTRAVENOUS

## 2018-07-21 MED ORDER — METOCLOPRAMIDE HCL 5 MG/ML IJ SOLN
INTRAMUSCULAR | Status: AC
  Filled 2018-07-21: qty 2

## 2018-07-21 MED ORDER — MIDAZOLAM HCL 2 MG/2ML IJ SOLN
INTRAMUSCULAR | Status: AC
  Filled 2018-07-21: qty 2

## 2018-07-21 MED ORDER — LACTATED RINGERS IV SOLN: INTRAVENOUS | Status: DC

## 2018-07-21 MED ORDER — PHENYLEPHRINE HCL 10 MG/ML IJ SOLN
INTRAMUSCULAR | Status: DC | PRN
  Administered 2018-07-21 (×2): 40 ug via INTRAVENOUS

## 2018-07-21 SURGICAL SUPPLY — 27 items
BAG URO CATCHER STRL LF (MISCELLANEOUS) ×3 IMPLANT
BASKET LASER NITINOL 1.9FR (BASKET) ×2 IMPLANT
BSKT STON RTRVL 120 1.9FR (BASKET) ×1
CATH INTERMIT  6FR 70CM (CATHETERS) ×3 IMPLANT
CLOTH BEACON ORANGE TIMEOUT ST (SAFETY) ×3 IMPLANT
COVER SURGICAL LIGHT HANDLE (MISCELLANEOUS) ×3 IMPLANT
COVER WAND RF STERILE (DRAPES) IMPLANT
EXTRACTOR STONE 1.7FRX115CM (UROLOGICAL SUPPLIES) IMPLANT
FIBER LASER FLEXIVA 1000 (UROLOGICAL SUPPLIES) IMPLANT
FIBER LASER FLEXIVA 365 (UROLOGICAL SUPPLIES) IMPLANT
FIBER LASER FLEXIVA 550 (UROLOGICAL SUPPLIES) IMPLANT
FIBER LASER TRAC TIP (UROLOGICAL SUPPLIES) ×2 IMPLANT
GLOVE BIOGEL M STRL SZ7.5 (GLOVE) ×3 IMPLANT
GOWN STRL REUS W/TWL LRG LVL3 (GOWN DISPOSABLE) ×6 IMPLANT
GUIDEWIRE ANG ZIPWIRE 038X150 (WIRE) ×3 IMPLANT
GUIDEWIRE STR DUAL SENSOR (WIRE) ×3 IMPLANT
IV NS 1000ML (IV SOLUTION) ×3
IV NS 1000ML BAXH (IV SOLUTION) ×1 IMPLANT
MANIFOLD NEPTUNE II (INSTRUMENTS) ×3 IMPLANT
PACK CYSTO (CUSTOM PROCEDURE TRAY) ×3 IMPLANT
SHEATH URETERAL 12FRX28CM (UROLOGICAL SUPPLIES) IMPLANT
SHEATH URETERAL 12FRX35CM (MISCELLANEOUS) ×2 IMPLANT
STENT URET 6FRX26 CONTOUR (STENTS) ×2 IMPLANT
SYR CONTROL 10ML LL (SYRINGE) ×3 IMPLANT
TUBE FEEDING 8FR 16IN STR KANG (MISCELLANEOUS) ×3 IMPLANT
TUBING CONNECTING 10 (TUBING) ×2 IMPLANT
TUBING CONNECTING 10' (TUBING) ×1

## 2018-07-21 NOTE — Discharge Instructions (Signed)
1 - You may have urinary urgency (bladder spasms) and bloody urine on / off with stent in place. This is normal. ° °2 - Call MD or go to ER for fever >102, severe pain / nausea / vomiting not relieved by medications, or acute change in medical status ° °

## 2018-07-21 NOTE — Anesthesia Postprocedure Evaluation (Signed)
Anesthesia Post Note  Patient: RAIHAN KIMMEL  Procedure(s) Performed: CYSTOSCOPY WITH RETROGRADE PYELOGRAM, URETEROSCOPY AND STENT PLACEMENT (Right ) HOLMIUM LASER APPLICATION (Right )     Patient location during evaluation: PACU Anesthesia Type: General Level of consciousness: awake and alert Pain management: pain level controlled Vital Signs Assessment: post-procedure vital signs reviewed and stable Respiratory status: spontaneous breathing, nonlabored ventilation, respiratory function stable and patient connected to nasal cannula oxygen Cardiovascular status: blood pressure returned to baseline and stable Postop Assessment: no apparent nausea or vomiting Anesthetic complications: no    Last Vitals:  Vitals:   07/21/18 1715 07/21/18 1730  BP: (!) 157/102 (!) 155/75  Pulse: 78 80  Resp: 17 14  Temp:    SpO2: 98% 99%    Last Pain:  Vitals:   07/21/18 1715  TempSrc:   PainSc: 2                  Montez Hageman

## 2018-07-21 NOTE — Transfer of Care (Addendum)
Immediate Anesthesia Transfer of Care Note  Patient: [name]  Procedure(s) Performed: CYSTOSCOPY WITH RETROGRADE PYELOGRAM, URETEROSCOPY AND STENT PLACEMENT (Right ) HOLMIUM LASER APPLICATION (Right )  Patient Location: PACU  Anesthesia Type:General  Level of Consciousness: awake, alert , oriented and patient cooperative  Airway & Oxygen Therapy: Patient Spontanous Breathing and Patient connected to face mask oxygen  Post-op Assessment: Report given to RN, Post -op Vital signs reviewed and stable and Patient moving all extremities  Post vital signs: Reviewed and stable  Last Vitals:  Vitals Value Taken Time  BP 157/102 07/21/2018  5:15 PM  Temp 37 C 07/21/2018  5:09 PM  Pulse 80 07/21/2018  5:17 PM  Resp 17 07/21/2018  5:17 PM  SpO2 98 % 07/21/2018  5:17 PM  Vitals shown include unvalidated device data.  Last Pain:  Vitals:   07/21/18 1227  TempSrc:   PainSc: 3       Patients Stated Pain Goal: 4 (27/06/23 7628)  Complications: No apparent anesthesia complications

## 2018-07-21 NOTE — Anesthesia Procedure Notes (Signed)
Procedure Name: LMA Insertion Date/Time: 07/21/2018 3:49 PM Performed by: Ofilia Neas, CRNA Pre-anesthesia Checklist: Patient identified, Emergency Drugs available, Patient being monitored, Suction available and Timeout performed Patient Re-evaluated:Patient Re-evaluated prior to induction Oxygen Delivery Method: Circle system utilized Preoxygenation: Pre-oxygenation with 100% oxygen Induction Type: IV induction LMA: LMA with gastric port inserted LMA Size: 5.0 Number of attempts: 2 Placement Confirmation: positive ETCO2 Tube secured with: Tape Dental Injury: Teeth and Oropharynx as per pre-operative assessment
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
## 2018-07-21 NOTE — Brief Op Note (Signed)
07/21/2018  4:53 PM  PATIENT:  [name]  62 y.o. male  PRE-OPERATIVE DIAGNOSIS:  RIGHT URETERAL STONE  POST-OPERATIVE DIAGNOSIS:  RIGHT URETERAL STONE  PROCEDURE:  Procedure(s) with comments: CYSTOSCOPY WITH RETROGRADE PYELOGRAM, URETEROSCOPY AND STENT PLACEMENT (Right) - 90 MINS HOLMIUM LASER APPLICATION (Right)  SURGEON:  Surgeon(s) and Role:    * Alexis Frock, MD - Primary  PHYSICIAN ASSISTANT:   ASSISTANTS: none   ANESTHESIA:   general  EBL:  minimal   BLOOD ADMINISTERED:none  DRAINS: none   LOCAL MEDICATIONS USED:  NONE  SPECIMEN:  Source of Specimen:  Rt ureteral / renal stone fragments  DISPOSITION OF SPECIMEN:  [facility] for compositional analysis  COUNTS:  YES  TOURNIQUET:  * No tourniquets in log *  DICTATION: .Other Dictation: Dictation Number [id number]  PLAN OF CARE: Discharge to home after PACU  PATIENT DISPOSITION:  PACU - hemodynamically stable.   Delay start of Pharmacological VTE agent (>24hrs) due to surgical blood loss or risk of bleeding: yes

## 2018-07-21 NOTE — Op Note (Signed)
NAME: [name], ZAHLER MEDICAL RECORD [id number] ACCOUNT [id number] DATE OF BIRTH:[birth year] FACILITY: [facility] LOCATION: [facility] PHYSICIAN:Dorrene Bently, MD  OPERATIVE REPORT  DATE OF PROCEDURE:  07/21/2018  PREOPERATIVE DIAGNOSIS:  Large volume right ureteral stone, functionally dominant solitary right kidney.  PROCEDURE: 1.  Cystoscopy, right retrograde pyelogram interpretation. 2.  Exchange of right ureteral stent 6 x 26 contour, no tether. 3.  Right ureteroscopy with laser lithotripsy first stage.  ESTIMATED BLOOD LOSS:  Nil.  COMPLICATIONS:  None.  SPECIMENS:  Right renal and ureteral stone fragments for analysis.  FINDINGS: 1.  Right distal ureteral stone removed with basketing. 2.  Very large right renal pelvis stone and right lower pole stones, estimated approximately 70% of stone ablated today using dusting technique. 3.  Successful replacement of right ureteral stent proximal in renal pelvis, distally in urinary bladder.  INDICATIONS:  The patient is a very pleasant but significantly comorbid 62 year old gentleman who was found on workup for colicky flank pain and acute renal failure to have a right ureteral stone as well as large volume right renal stone and a  functionally dominant kidney.  He was in acute renal failure at the time.  He was temporized with stenting to allow for renal decompression, and his GFR has trended back towards baseline.  He now presents for definitive stone management in a staged  fashion given his large volume stone and goal being stone free given his functionally dominant kidney.  Informed consent was obtained and placed in the medical record.  DESCRIPTION OF PROCEDURE:  The patient being identified and procedure being right ureteroscopic stimulation first stage was confirmed.  Procedure timeout was performed.  Intravenous antibiotics were administered.  General anesthesia introduced.  The  patient was placed into a low lithotomy  position.  A sterile field was created by prepping and draping the penis, perineum and proximal thighs using iodine.  Cystourethroscopy was performed using a 22-French rigid cystoscope with offset lens.  Inspection  of the anterior and posterior urethra was unremarkable.  Inspection of urinary bladder revealed no diverticula, calcifications, or papillary lesions.  The distal end of the right ureteral stent was seen in situ.  It was grasped and brought to the level  of the urethral meatus.  A 0.038 ZIPwire was advanced to the lower pole and set aside as a safety wire, exchanged for open-ended catheter, and right retrograde pyelogram was obtained.    Right retrograde pyelogram shows a single right ureter, single-system right kidney.  There was what appeared to be a filling defect in the distal ureter consistent with known stone as well as multifocal large volume intrarenal stones.  The ZIPwire was  once again advanced.  An 8-French feeding tube was placed in the urinary bladder for pressure release, and semirigid ureteroscopy was performed of the distal ureter alongside a separate sensor working wire.  As expected, distal ureterovesical junction  stone was certainly present.  It was in a very fusiform configuration and did appear amenable to simple basketing.  It was grasped with an escape basket on its long axis, removed and set aside for composition analysis.  Semirigid ureteroscopy of the  remainder of the distal 4/5 of the right ureter revealed no additional calcifications or mucosal abnormalities.  The semirigid scope was then exchanged for a 12/14, 38 cm ureteral access sheath to the level of the proximal ureter.  Using continuous  fluoroscopic guidance and flexible digital ureteroscopy, performed the proximal right ureter and systematic inspection of the right kidney.  There was a large volume right renal pelvic stone as anticipated that appeared much too large for simple  basketing as well as some  lower pole stones as well.  Holmium laser energy was then applied to the dominant stone using settings of 0.2 joules and 20 Hz in a dusting technique, and this was applied for approximately an hour.  Estimated approximately 70%  of the stone was ablated in a dusting-type fashion.  The remaining 30% was fragmented.  An escape basket was then used to retrieve dominant fragments.  At this point, visualization was quite poor given the inherent volume of stone dust generated, given  the patient's blood thinner use, large volume stone, and functionally dominant kidney, it was clearly felt that a second-stage procedure would be warranted to verify stone free.  The access sheath was then removed under continuous ureteroscopic vision.   No mucosal abnormalities were found, and finally a new 6 x 26 contour-type stent was placed with remaining safety wire using fluoroscopic guidance.  Good proximal and distal points were noted, and the procedure was terminated.  The patient tolerated the  procedure well.  No immediate perioperative complications.  The patient was taken to postanesthesia care in stable condition.  LN/NUANCE  D:07/21/2018 T:07/21/2018 JOB:[id number]

## 2018-07-21 NOTE — Anesthesia Preprocedure Evaluation (Signed)
Anesthesia Evaluation  Patient identified by MRN, date of birth, ID band Patient awake    Reviewed: Allergy & Precautions, NPO status , Patient's Chart, lab work & pertinent test results  Airway Mallampati: II  TM Distance: >3 FB Neck ROM: Full    Dental   Pulmonary sleep apnea and Continuous Positive Airway Pressure Ventilation ,    Pulmonary exam normal        Cardiovascular hypertension, Pt. on medications + CAD, + Past MI, + Cardiac Stents and + CABG  Normal cardiovascular exam     Neuro/Psych Depression negative neurological ROS     GI/Hepatic negative GI ROS, Neg liver ROS,   Endo/Other  negative endocrine ROS  Renal/GU negative Renal ROS  negative genitourinary   Musculoskeletal negative musculoskeletal ROS (+)   Abdominal   Peds negative pediatric ROS (+)  Hematology negative hematology ROS (+)   Anesthesia Other Findings   Reproductive/Obstetrics negative OB ROS                             Anesthesia Physical  Anesthesia Plan  ASA: III  Anesthesia Plan: General   Post-op Pain Management:    Induction:   PONV Risk Score and Plan: 2 and Ondansetron and Treatment may vary due to age or medical condition  Airway Management Planned: LMA  Additional Equipment:   Intra-op Plan:   Post-operative Plan:   Informed Consent: I have reviewed the patients History and Physical, chart, labs and discussed the procedure including the risks, benefits and alternatives for the proposed anesthesia with the patient or authorized representative who has indicated his/her understanding and acceptance.     Plan Discussed with: CRNA and Surgeon  Anesthesia Plan Comments:         Anesthesia Quick Evaluation

## 2018-07-22 ENCOUNTER — Encounter (HOSPITAL_COMMUNITY): Payer: Self-pay | Admitting: Urology

## 2018-08-02 ENCOUNTER — Encounter: Payer: Self-pay | Admitting: Physician Assistant

## 2018-08-02 ENCOUNTER — Other Ambulatory Visit: Payer: Self-pay | Admitting: Physician Assistant

## 2018-08-02 ENCOUNTER — Ambulatory Visit: Payer: Medicare Other | Admitting: Physician Assistant

## 2018-08-02 VITALS — BP 136/94 | HR 73 | Ht 71.0 in | Wt 277.0 lb

## 2018-08-02 DIAGNOSIS — R079 Chest pain, unspecified: Secondary | ICD-10-CM | POA: Diagnosis not present

## 2018-08-02 DIAGNOSIS — E785 Hyperlipidemia, unspecified: Secondary | ICD-10-CM | POA: Diagnosis not present

## 2018-08-02 DIAGNOSIS — I10 Essential (primary) hypertension: Secondary | ICD-10-CM | POA: Diagnosis not present

## 2018-08-02 DIAGNOSIS — N289 Disorder of kidney and ureter, unspecified: Secondary | ICD-10-CM

## 2018-08-02 DIAGNOSIS — Z9989 Dependence on other enabling machines and devices: Secondary | ICD-10-CM

## 2018-08-02 DIAGNOSIS — G4733 Obstructive sleep apnea (adult) (pediatric): Secondary | ICD-10-CM

## 2018-08-02 DIAGNOSIS — I25708 Atherosclerosis of coronary artery bypass graft(s), unspecified, with other forms of angina pectoris: Secondary | ICD-10-CM

## 2018-08-02 DIAGNOSIS — N189 Chronic kidney disease, unspecified: Secondary | ICD-10-CM

## 2018-08-02 DIAGNOSIS — N200 Calculus of kidney: Secondary | ICD-10-CM

## 2018-08-02 MED ORDER — EVOLOCUMAB 140 MG/ML ~~LOC~~ SOAJ: 140.0000 mg | SUBCUTANEOUS | 4 refills | Status: DC

## 2018-08-02 MED ORDER — NITROGLYCERIN 0.4 MG SL SUBL: SUBLINGUAL_TABLET | SUBLINGUAL | 2 refills | Status: DC

## 2018-08-02 NOTE — H&P (View-Only) (Signed)
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 home or your discharge will be delayed. 9. Please wear clothes that are easy to get on and off and wear slip-on shoes.  Thank you for allowing [place] to care for you!   -- [facility]      Signed, Almyra Deforest, [place]  08/02/2018 2:23 PM    [address], [place], [place]  [address] Phone: [phone]; Fax: [phone]

## 2018-08-02 NOTE — Progress Notes (Signed)
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 discharge will be delayed. 9. Please wear clothes that are easy to get on and off and wear slip-on shoes.  Thank you for allowing [place] to care for you!   -- [facility] Invasive Cardiovascular services      Signed, Almyra Deforest, [place]  08/02/2018 2:23 PM    [address], [place], [place]  [address] Phone: [phone]; Fax: [phone]

## 2018-08-02 NOTE — Patient Instructions (Addendum)
Medication Instructions:  Your physician recommends that you continue on your current medications as directed. Please refer to the Current Medication list given to you today.  If you need a refill on your cardiac medications before your next appointment, please call your pharmacy.   Lab work: Your physician recommends that you return for lab work in: [place] If you have labs (blood work) drawn today and your tests are completely normal, you will receive your results only by: Marland Kitchen MyChart Message (if you have MyChart) OR . A paper copy in the mail If you have any lab test that is abnormal or we need to change your treatment, we will call you to review the results.  Testing/Procedures: Your physician has requested that you have a cardiac catheterization. Cardiac catheterization is used to diagnose and/or treat various heart conditions. Doctors may recommend this procedure for a number of different reasons. The most common reason is to evaluate chest pain. Chest pain can be a symptom of coronary artery disease (CAD), and cardiac catheterization can show whether plaque is narrowing or blocking your heart's arteries. This procedure is also used to evaluate the valves, as well as measure the blood flow and oxygen levels in different parts of your heart. For further information please visit [email or web address].[email or web address]. Please follow instruction sheet, as given. CATH INSTRUCTIONS BELOW SCHEDULED FOR 08/14/2018 WITH DR HARDING  Follow-Up: At [facility], you and your health needs are our priority.  As part of our continuing mission to provide you with exceptional heart care, we have created designated Provider Care Teams.  These Care Teams include your primary Cardiologist (physician) and Advanced Practice Providers (APPs -  Physician Assistants and Nurse Practitioners) who all work together to provide you with the care you need, when you need it.  Your physician recommends that you  schedule a follow-up appointment in: 1 MONTH AFTER CATH WITH DR Vesta.  Any Other Special Instructions Will Be Listed Below (If Applicable).          [address] [place] [place] [address] Dept: [phone] Loc: [place]  08/02/2018  You are scheduled for a Cardiac Catheterization on Monday, November 4 with Dr. [name].  1. Please arrive at the [facility] (Main Entrance A) at [facility]: [address] [place], [place] [address] at 8:30 AM (This time is two hours before your procedure to ensure your preparation). Free valet parking service is available.   Special note: Every effort is made to have your procedure done on time. Please understand that emergencies sometimes delay scheduled procedures.  2. Diet: Do not eat solid foods after midnight.  The patient may have clear liquids until 5am upon the day of the procedure.  3. Labs: You will need to have blood drawn on Wednesday, October 30 at [facility]  Open: [place] (Lunch 12:30 - 1:30)   Phone: [phone]. You do not need to be fasting.  4. Medication instructions in preparation for your procedure:   Contrast Allergy: No   Current Outpatient Medications (Cardiovascular):  .  amLODipine (NORVASC) 5 MG tablet, Take 1 tablet (5 mg total) by mouth daily. .  Evolocumab (REPATHA SURECLICK) 725 MG/ML SOAJ, Inject 140 mg into the skin every 14 (fourteen) days. .  nitroGLYCERIN (NITROSTAT) 0.4 MG SL tablet, PLACE 1TAB UNDER TONGUE EVERY 5MIN FOR 3DOSES  AS NEEDED FOR CHEST PAIN (Patient taking differently: Place 0.4 mg under the tongue every 5 (five) minutes as needed for chest pain. ) .  propranolol (INDERAL) 20 MG tablet, Take 1 tablet (20 mg total) by mouth 3 (three) times daily.   Current Outpatient Medications (Analgesics):  .  aspirin EC 81 MG  tablet, Take 1 tablet (81 mg total) by mouth daily. Marland Kitchen  oxyCODONE-acetaminophen (PERCOCET) 5-325 MG tablet, Take 1-2 tablets by mouth every 6 (six) hours as needed for moderate pain or severe pain. Post-operatively  Current Outpatient Medications (Hematological):  Marland Kitchen  BRILINTA 90 MG TABS tablet, TAKE 1 TABLET BY MOUTH TWICE A DAY (Patient taking differently: Take 90 mg by mouth 2 (two) times daily. )  Current Outpatient Medications (Other):  Marland Kitchen  buPROPion (WELLBUTRIN SR) 150 MG 12 hr tablet, Take 150 mg by mouth 2 (two) times daily. .  clonazePAM (KLONOPIN) 2 MG tablet, Take 1 tablet (2 mg total) by mouth at bedtime. .  cyclobenzaprine (FLEXERIL) 10 MG tablet, Take 10 mg by mouth 3 (three) times daily as needed for muscle spasms.   On the morning of your procedure, take your Aspirin and any morning medicines  listed above.  You may use sips of water.  5. Plan for one night stay--bring personal belongings. 6. Bring a current list of your medications and current insurance cards. 7. You MUST have a responsible person to drive you home. 8. Someone MUST be with you the first 24 hours after you arrive home or your discharge will be delayed. 9. Please wear clothes that are easy to get on and off and wear slip-on shoes.  Thank you for allowing [place] to care for you!   -- [place] Invasive Cardiovascular services

## 2018-08-03 ENCOUNTER — Other Ambulatory Visit (INDEPENDENT_AMBULATORY_CARE_PROVIDER_SITE_OTHER): Payer: Medicare Other

## 2018-08-03 DIAGNOSIS — R079 Chest pain, unspecified: Secondary | ICD-10-CM

## 2018-08-03 LAB — BASIC METABOLIC PANEL
BUN/Creatinine Ratio: 14 (ref 10–24)
BUN: 21 mg/dL (ref 8–27)
CHLORIDE: 103 mmol/L (ref 96–106)
CO2: 17 mmol/L — ABNORMAL LOW (ref 20–29)
Calcium: 9.8 mg/dL (ref 8.6–10.2)
Creatinine, Ser: 1.51 mg/dL — ABNORMAL HIGH (ref 0.76–1.27)
GFR, EST AFRICAN AMERICAN: 56 mL/min/{1.73_m2} — AB (ref >59)
GFR, EST NON AFRICAN AMERICAN: 49 mL/min/{1.73_m2} — AB (ref >59)
Glucose: 102 mg/dL — ABNORMAL HIGH (ref 65–99)
POTASSIUM: 4.8 mmol/L (ref 3.5–5.2)
Sodium: 137 mmol/L (ref 134–144)

## 2018-08-08 ENCOUNTER — Other Ambulatory Visit: Payer: Self-pay

## 2018-08-08 ENCOUNTER — Encounter (HOSPITAL_COMMUNITY): Payer: Self-pay | Admitting: *Deleted

## 2018-08-08 NOTE — Progress Notes (Signed)
DR Kalman Shan ( anesthesia) made aware of patient history and cath scheduled for 08/14/2018 with cardiology office visit note placed on front of chart.  Cardiology aware of cystoscopy for 08/09/2018.  No new orders given.

## 2018-08-09 ENCOUNTER — Ambulatory Visit (HOSPITAL_COMMUNITY)
Admission: RE | Admit: 2018-08-09 | Discharge: 2018-08-09 | Disposition: A | Discharge status: Home / Self Care | Payer: Medicare Other | Source: Ambulatory Visit | Attending: Urology | Admitting: Urology

## 2018-08-09 ENCOUNTER — Ambulatory Visit (HOSPITAL_COMMUNITY): Payer: Medicare Other | Admitting: Anesthesiology

## 2018-08-09 ENCOUNTER — Encounter (HOSPITAL_COMMUNITY)
Admission: RE | Disposition: A | Discharge status: Home / Self Care | Payer: Self-pay | Source: Ambulatory Visit | Attending: Urology

## 2018-08-09 ENCOUNTER — Ambulatory Visit (HOSPITAL_COMMUNITY): Payer: Medicare Other

## 2018-08-09 ENCOUNTER — Encounter (HOSPITAL_COMMUNITY): Payer: Self-pay

## 2018-08-09 DIAGNOSIS — Z951 Presence of aortocoronary bypass graft: Secondary | ICD-10-CM | POA: Insufficient documentation

## 2018-08-09 DIAGNOSIS — N202 Calculus of kidney with calculus of ureter: Secondary | ICD-10-CM | POA: Diagnosis not present

## 2018-08-09 DIAGNOSIS — Z79899 Other long term (current) drug therapy: Secondary | ICD-10-CM | POA: Insufficient documentation

## 2018-08-09 DIAGNOSIS — F329 Major depressive disorder, single episode, unspecified: Secondary | ICD-10-CM | POA: Insufficient documentation

## 2018-08-09 DIAGNOSIS — I129 Hypertensive chronic kidney disease with stage 1 through stage 4 chronic kidney disease, or unspecified chronic kidney disease: Secondary | ICD-10-CM | POA: Diagnosis not present

## 2018-08-09 DIAGNOSIS — N189 Chronic kidney disease, unspecified: Secondary | ICD-10-CM | POA: Insufficient documentation

## 2018-08-09 DIAGNOSIS — R0602 Shortness of breath: Secondary | ICD-10-CM | POA: Insufficient documentation

## 2018-08-09 DIAGNOSIS — N201 Calculus of ureter: Secondary | ICD-10-CM | POA: Diagnosis present

## 2018-08-09 DIAGNOSIS — Z955 Presence of coronary angioplasty implant and graft: Secondary | ICD-10-CM | POA: Diagnosis not present

## 2018-08-09 DIAGNOSIS — Z950 Presence of cardiac pacemaker: Secondary | ICD-10-CM | POA: Diagnosis not present

## 2018-08-09 DIAGNOSIS — E785 Hyperlipidemia, unspecified: Secondary | ICD-10-CM | POA: Insufficient documentation

## 2018-08-09 DIAGNOSIS — I2510 Atherosclerotic heart disease of native coronary artery without angina pectoris: Secondary | ICD-10-CM | POA: Insufficient documentation

## 2018-08-09 DIAGNOSIS — M545 Low back pain: Secondary | ICD-10-CM | POA: Diagnosis not present

## 2018-08-09 DIAGNOSIS — I252 Old myocardial infarction: Secondary | ICD-10-CM | POA: Insufficient documentation

## 2018-08-09 DIAGNOSIS — G4733 Obstructive sleep apnea (adult) (pediatric): Secondary | ICD-10-CM | POA: Insufficient documentation

## 2018-08-09 HISTORY — PX: HOLMIUM LASER APPLICATION: SHX5852

## 2018-08-09 HISTORY — PX: CYSTOSCOPY WITH RETROGRADE PYELOGRAM, URETEROSCOPY AND STENT PLACEMENT: SHX5789

## 2018-08-09 LAB — BASIC METABOLIC PANEL
Anion gap: 10 (ref 5–15)
BUN: 24 mg/dL — ABNORMAL HIGH (ref 8–23)
CALCIUM: 9.6 mg/dL (ref 8.9–10.3)
CO2: 22 mmol/L (ref 22–32)
CREATININE: 1.74 mg/dL — AB (ref 0.61–1.24)
Chloride: 106 mmol/L (ref 98–111)
GFR calc Af Amer: 47 mL/min — ABNORMAL LOW (ref >60)
GFR calc non Af Amer: 40 mL/min — ABNORMAL LOW (ref >60)
GLUCOSE: 126 mg/dL — AB (ref 70–99)
Potassium: 4.1 mmol/L (ref 3.5–5.1)
Sodium: 138 mmol/L (ref 135–145)

## 2018-08-09 LAB — CBC
HEMATOCRIT: 44.5 % (ref 39.0–52.0)
Hemoglobin: 14.7 g/dL (ref 13.0–17.0)
MCH: 27.8 pg (ref 26.0–34.0)
MCHC: 33 g/dL (ref 30.0–36.0)
MCV: 84.3 fL (ref 80.0–100.0)
Platelets: 300 10*3/uL (ref 150–400)
RBC: 5.28 MIL/uL (ref 4.22–5.81)
RDW: 14.2 % (ref 11.5–15.5)
WBC: 8.5 10*3/uL (ref 4.0–10.5)
nRBC: 0 % (ref 0.0–0.2)

## 2018-08-09 SURGERY — CYSTOURETEROSCOPY, WITH RETROGRADE PYELOGRAM AND STENT INSERTION
Anesthesia: General | Laterality: Right

## 2018-08-09 MED ORDER — PHENYLEPHRINE 40 MCG/ML (10ML) SYRINGE FOR IV PUSH (FOR BLOOD PRESSURE SUPPORT)
PREFILLED_SYRINGE | INTRAVENOUS | Status: AC
  Filled 2018-08-09: qty 10

## 2018-08-09 MED ORDER — FENTANYL CITRATE (PF) 100 MCG/2ML IJ SOLN
INTRAMUSCULAR | Status: DC | PRN
  Administered 2018-08-09 (×4): 25 ug via INTRAVENOUS

## 2018-08-09 MED ORDER — SODIUM CHLORIDE 0.9 % IV SOLN
INTRAVENOUS | Status: DC | PRN
  Administered 2018-08-09: 40 ug/min via INTRAVENOUS

## 2018-08-09 MED ORDER — IOHEXOL 300 MG/ML  SOLN
INTRAMUSCULAR | Status: DC | PRN
  Administered 2018-08-09: 10 mL

## 2018-08-09 MED ORDER — LIDOCAINE 2% (20 MG/ML) 5 ML SYRINGE
INTRAMUSCULAR | Status: AC
  Filled 2018-08-09: qty 5

## 2018-08-09 MED ORDER — PHENYLEPHRINE 40 MCG/ML (10ML) SYRINGE FOR IV PUSH (FOR BLOOD PRESSURE SUPPORT)
PREFILLED_SYRINGE | INTRAVENOUS | Status: DC | PRN
  Administered 2018-08-09: 80 ug via INTRAVENOUS

## 2018-08-09 MED ORDER — LACTATED RINGERS IV SOLN
INTRAVENOUS | Status: DC
  Administered 2018-08-09: 10:00:00 via INTRAVENOUS

## 2018-08-09 MED ORDER — SODIUM CHLORIDE 0.9 % IR SOLN
Status: DC | PRN
  Administered 2018-08-09: 6000 mL

## 2018-08-09 MED ORDER — ONDANSETRON HCL 4 MG/2ML IJ SOLN
INTRAMUSCULAR | Status: AC
  Filled 2018-08-09: qty 2

## 2018-08-09 MED ORDER — PROPOFOL 10 MG/ML IV BOLUS
INTRAVENOUS | Status: DC | PRN
  Administered 2018-08-09: 100 mg via INTRAVENOUS

## 2018-08-09 MED ORDER — SODIUM CHLORIDE 0.9 % IV SOLN
2.0000 g | INTRAVENOUS | Status: AC
  Administered 2018-08-09: 2 g via INTRAVENOUS
  Filled 2018-08-09: qty 20

## 2018-08-09 MED ORDER — MIDAZOLAM HCL 5 MG/5ML IJ SOLN
INTRAMUSCULAR | Status: DC | PRN
  Administered 2018-08-09: 1 mg via INTRAVENOUS

## 2018-08-09 MED ORDER — FENTANYL CITRATE (PF) 100 MCG/2ML IJ SOLN
INTRAMUSCULAR | Status: AC
  Filled 2018-08-09: qty 2

## 2018-08-09 MED ORDER — PROPRANOLOL HCL 20 MG PO TABS
20.0000 mg | ORAL_TABLET | Freq: Once | ORAL | Status: AC
  Administered 2018-08-09: 20 mg via ORAL
  Filled 2018-08-09: qty 1

## 2018-08-09 MED ORDER — ONDANSETRON HCL 4 MG/2ML IJ SOLN
INTRAMUSCULAR | Status: DC | PRN
  Administered 2018-08-09: 4 mg via INTRAVENOUS

## 2018-08-09 MED ORDER — PHENYLEPHRINE HCL 10 MG/ML IJ SOLN
INTRAMUSCULAR | Status: AC
  Filled 2018-08-09: qty 1

## 2018-08-09 MED ORDER — 0.9 % SODIUM CHLORIDE (POUR BTL) OPTIME
TOPICAL | Status: DC | PRN
  Administered 2018-08-09: 1000 mL

## 2018-08-09 MED ORDER — PROPOFOL 10 MG/ML IV BOLUS
INTRAVENOUS | Status: AC
  Filled 2018-08-09: qty 20

## 2018-08-09 MED ORDER — MIDAZOLAM HCL 2 MG/2ML IJ SOLN
INTRAMUSCULAR | Status: AC
  Filled 2018-08-09: qty 2

## 2018-08-09 MED ORDER — CEPHALEXIN 500 MG PO CAPS: 500.0000 mg | ORAL_CAPSULE | Freq: Two times a day (BID) | ORAL | 0 refills | Status: DC

## 2018-08-09 MED ORDER — OXYCODONE-ACETAMINOPHEN 5-325 MG PO TABS: 1.0000 | ORAL_TABLET | Freq: Four times a day (QID) | ORAL | 0 refills | Status: AC | PRN

## 2018-08-09 SURGICAL SUPPLY — 28 items
BAG URO CATCHER STRL LF (MISCELLANEOUS) ×3 IMPLANT
BASKET LASER NITINOL 1.9FR (BASKET) IMPLANT
BASKET STONE NCOMPASS (UROLOGICAL SUPPLIES) ×2 IMPLANT
BSKT STON RTRVL 120 1.9FR (BASKET)
CATH INTERMIT  6FR 70CM (CATHETERS) ×3 IMPLANT
CLOTH BEACON ORANGE TIMEOUT ST (SAFETY) ×1 IMPLANT
COVER SURGICAL LIGHT HANDLE (MISCELLANEOUS) ×1 IMPLANT
COVER WAND RF STERILE (DRAPES) IMPLANT
EXTRACTOR STONE 1.7FRX115CM (UROLOGICAL SUPPLIES) IMPLANT
FIBER LASER FLEXIVA 1000 (UROLOGICAL SUPPLIES) IMPLANT
FIBER LASER FLEXIVA 365 (UROLOGICAL SUPPLIES) IMPLANT
FIBER LASER FLEXIVA 550 (UROLOGICAL SUPPLIES) IMPLANT
FIBER LASER TRAC TIP (UROLOGICAL SUPPLIES) ×2 IMPLANT
GLOVE BIOGEL M STRL SZ7.5 (GLOVE) ×3 IMPLANT
GOWN STRL REUS W/TWL LRG LVL3 (GOWN DISPOSABLE) ×6 IMPLANT
GUIDEWIRE ANG ZIPWIRE 038X150 (WIRE) ×3 IMPLANT
GUIDEWIRE STR DUAL SENSOR (WIRE) ×3 IMPLANT
IV NS 1000ML (IV SOLUTION) ×3
IV NS 1000ML BAXH (IV SOLUTION) ×1 IMPLANT
MANIFOLD NEPTUNE II (INSTRUMENTS) ×3 IMPLANT
PACK CYSTO (CUSTOM PROCEDURE TRAY) ×3 IMPLANT
SHEATH URETERAL 12FRX28CM (UROLOGICAL SUPPLIES) IMPLANT
SHEATH URETERAL 12FRX35CM (MISCELLANEOUS) ×2 IMPLANT
STENT POLARIS 5FRX26 (STENTS) ×2 IMPLANT
SYR CONTROL 10ML LL (SYRINGE) ×3 IMPLANT
TUBE FEEDING 8FR 16IN STR KANG (MISCELLANEOUS) ×3 IMPLANT
TUBING CONNECTING 10 (TUBING) ×2 IMPLANT
TUBING CONNECTING 10' (TUBING) ×1

## 2018-08-09 NOTE — Brief Op Note (Signed)
08/09/2018  12:12 PM  PATIENT:  [name]  62 y.o. male  PRE-OPERATIVE DIAGNOSIS:  RIGHT URETERAL STONE  POST-OPERATIVE DIAGNOSIS:  RIGHT URETERAL STONE  PROCEDURE:  Procedure(s) with comments: CYSTOSCOPY WITH RETROGRADE PYELOGRAM, URETEROSCOPY AND STENT PLACEMENT (Right) - 75 MINS HOLMIUM LASER APPLICATION (Right)  SURGEON:  Surgeon(s) and Role:    * Alexis Frock, MD - Primary  PHYSICIAN ASSISTANT:   ASSISTANTS: none   ANESTHESIA:   general  EBL:  minimal   BLOOD ADMINISTERED:none  DRAINS: none   LOCAL MEDICATIONS USED:  NONE  SPECIMEN:  Source of Specimen:  Rt renal stone fragments  DISPOSITION OF SPECIMEN:  discard  COUNTS:  YES  TOURNIQUET:  * No tourniquets in log *  DICTATION: .Other Dictation: Dictation Number [id number]  PLAN OF CARE: Admit for overnight observation  PATIENT DISPOSITION:  PACU - hemodynamically stable.   Delay start of Pharmacological VTE agent (>24hrs) due to surgical blood loss or risk of bleeding: yes
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
## 2018-08-09 NOTE — Transfer of Care (Signed)
Immediate Anesthesia Transfer of Care Note  Patient: [name]  Procedure(s) Performed: CYSTOSCOPY WITH RETROGRADE PYELOGRAM, URETEROSCOPY AND STENT PLACEMENT (Right ) HOLMIUM LASER APPLICATION (Right )  Patient Location: PACU  Anesthesia Type:General  Level of Consciousness: awake, alert  and oriented  Airway & Oxygen Therapy: Patient Spontanous Breathing and Patient connected to face mask oxygen  Post-op Assessment: Report given to RN and Post -op Vital signs reviewed and stable  Post vital signs: Reviewed and stable  Last Vitals:  Vitals Value Taken Time  BP 189/123 08/09/2018 12:26 PM  Temp    Pulse 73 08/09/2018 12:28 PM  Resp 20 08/09/2018 12:28 PM  SpO2 98 % 08/09/2018 12:28 PM  Vitals shown include unvalidated device data.  Last Pain:  Vitals:   08/09/18 0931  TempSrc:   PainSc: 0-No pain         Complications: No apparent anesthesia complications
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
## 2018-08-09 NOTE — Op Note (Signed)
NAME: [name], [name] MEDICAL RECORD JJ:[id number] ACCOUNT [id number] DATE OF BIRTH:1956-06-24 FACILITY: [facility] LOCATION: [facility] PHYSICIAN:Jenicka Coxe, MD  OPERATIVE REPORT  DATE OF PROCEDURE:  08/09/2018  PREOPERATIVE DIAGNOSIS:  Large right renal residual stone.  PROCEDURE: 1.  Cystoscopy, right retrograde pyelogram, interpretation. 2.  Exchange of right ureteral stent 5 x 26 Polaris, no tether. 3.  Right second-stage ureteroscopy with laser lithotripsy.  ESTIMATED BLOOD LOSS:  Nil.  COMPLICATIONS:  None.  SPECIMEN:  Right renal stone fragments were discarded.  OPERATIVE FINDINGS: 1.  Right lower pole residual stones. estimated residual volume approximately 9 mm2. 2.  Complete resolution of all accessible stone fragments larger than one-third millimeter  following laser lithotripsy and basket extraction. 3.  Successful replacement of right ureteral stent proximal and renal pelvis, distally in urinary bladder.  INDICATIONS:  The patient is a very pleasant but significantly comorbid 62 year old man who was found on workup of a flank pain and acute renal failure to have a large right renal and right ureteral stone and a dominant kidney.  He underwent stenting to  temporize and allow resolution of his acute renal failure.  Options were discussed for definitive stone management including recommended path of staged ureteroscopy with goal of stone free and he wished to proceed with this.  He underwent first stage  procedure several weeks ago, at which point the vast majority of the stone was addressed.  Given the large volume of stone, it was felt that a second-stage procedure was clearly warranted to achieve stone free status.  Informed consent was obtained and  placed in medical record.  DESCRIPTION OF PROCEDURE:  The patient being [name], procedure right second-stage ureteroscopic stimulation was confirmed.  Procedure timeout was performed.  Intravenous antibiotics  were administered.  General anesthesia introduced.  The  patient was placed into a low lithotomy position and sterile field was created by prepping and draping his penis, perineum and proximal thighs using iodine.  Cystourethroscopy was performed using a 22-French rigid cystoscope with offset lens.  The  anterior and posterior urethra were unremarkable.  Inspection of bladder revealed distal end of right ureteral stent in situ.  There was minimal encrustation.  It was grasped and brought out in its entirety, set aside for discard.  The right ureteral  orifice was then cannulated with a 6-French end-hole catheter, and right retrograde pyelogram was obtained.  Right retrograde pyelogram demonstrated a single right ureter, single system right kidney.  There was moderate hydronephrosis from baseline.  No hydroureteronephrosis.  No obvious filling defects.  A spine orthopedic hardware noted in expected position.   A ZIPwire was advanced to lower pole and set aside as a safety wire.  An 8-French feeding tube was placed in the urinary bladder, pressure released and semi-rigid ureteroscopy was performed of the distal orifice of the right ureter alongside a separate  sensor working wire.  No mucosal abnormalities were found or intraluminal significant calcifications.  The semirigid scope was exchanged for a 12/14, 38 cm ureteral access sheath to the level of the proximal ureter using continuous fluoroscopic guidance  and flexible digital ureteroscopy performed the proximal ureter and systematic inspection of the right kidney, including all calices x3.  There was residual stone volume only in the lower pole calix was at rest of the stone had been ablated and fragments  drained.  Overall volume was approximately 9 mm2.  This was a combination of some dominant fragments and essentially stone dust.  A dusting technique was then used  on the dominant fragments using settings of 0.2 joules and 20 Hz, and the dominant   fragments were dusted.  An NCompass basket was then used to remove as much fragments as possible until fragments simply passed through the tines of the basket.  A popcorn technique was then used with the laser using settings of 2 joules and 10 Hz and the  remaining fragments were ablated using this technique resulting in complete resolution of all stone fragments larger than one third millimeter with remaining stone material simply being essentially dust.  All accessible calices once again inspected.   The access sheath under continuous vision, no mucosal abnormalities were found.  Given he does have some stone dust that needs to clear and he does have upcoming cardiac catheterization to help maximally ensure a good renal function during this, it was  felt that exchange of stent with a nontethered would be most prudent.  As such, a new 5 x 26 hype stent was placed with remaining safety wire using fluoroscopic guidance.  Good proximal and distal points were noted.    The procedure was terminated.  The patient tolerated the procedure well.  No immediate apparent complications.  The patient taken to postanesthesia care in stable condition.  AN/NUANCE  D:08/09/2018 T:08/09/2018 JOB:[id number]

## 2018-08-09 NOTE — Anesthesia Procedure Notes (Signed)
Procedure Name: LMA Insertion Date/Time: 08/09/2018 11:13 AM Performed by: Anne Fu, CRNA Pre-anesthesia Checklist: Patient identified, Emergency Drugs available, Suction available, Patient being monitored and Timeout performed Patient Re-evaluated:Patient Re-evaluated prior to induction Oxygen Delivery Method: Circle system utilized Preoxygenation: Pre-oxygenation with 100% oxygen Induction Type: IV induction Ventilation: Mask ventilation without difficulty LMA: LMA inserted LMA Size: 5.0 Number of attempts: 1 Placement Confirmation: positive ETCO2 and breath sounds checked- equal and bilateral Tube secured with: Tape

## 2018-08-09 NOTE — Discharge Instructions (Signed)
1 - You may have urinary urgency (bladder spasms) and bloody urine on / off with stent in place. This is normal.  2 - Call MD or go to ER for fever >102, severe pain / nausea / vomiting not relieved by medications, or acute change in medical status\     General Anesthesia, Adult, Care After These instructions provide you with information about caring for yourself after your procedure. Your health care provider may also give you more specific instructions. Your treatment has been planned according to current medical practices, but problems sometimes occur. Call your health care provider if you have any problems or questions after your procedure. What can I expect after the procedure? After the procedure, it is common to have:  Vomiting.  A sore throat.  Mental slowness.  It is common to feel:  Nauseous.  Cold or shivery.  Sleepy.  Tired.  Sore or achy, even in parts of your body where you did not have surgery.  Follow these instructions at home: For at least 24 hours after the procedure:  Do not: ? Participate in activities where you could fall or become injured. ? Drive. ? Use heavy machinery. ? Drink alcohol. ? Take sleeping pills or medicines that cause drowsiness. ? Make important decisions or sign legal documents. ? Take care of children on your own.  Rest. Eating and drinking  If you vomit, drink water, juice, or soup when you can drink without vomiting.  Drink enough fluid to keep your urine clear or pale yellow.  Make sure you have little or no nausea before eating solid foods.  Follow the diet recommended by your health care provider. General instructions  Have a responsible adult stay with you until you are awake and alert.  Return to your normal activities as told by your health care provider. Ask your health care provider what activities are safe for you.  Take over-the-counter and prescription medicines only as told by your health care  provider.  If you smoke, do not smoke without supervision.  Keep all follow-up visits as told by your health care provider. This is important. Contact a health care provider if:  You continue to have nausea or vomiting at home, and medicines are not helpful.  You cannot drink fluids or start eating again.  You cannot urinate after 8-12 hours.  You develop a skin rash.  You have fever.  You have increasing redness at the site of your procedure. Get help right away if:  You have difficulty breathing.  You have chest pain.  You have unexpected bleeding.  You feel that you are having a life-threatening or urgent problem. This information is not intended to replace advice given to you by your health care provider. Make sure you discuss any questions you have with your health care provider. Document Released: 01/03/2001 Document Revised: 03/01/2016 Document Reviewed: 09/11/2015 Elsevier Interactive Patient Education  [employer].

## 2018-08-10 ENCOUNTER — Telehealth: Payer: Self-pay | Admitting: *Deleted

## 2018-08-10 ENCOUNTER — Encounter (HOSPITAL_COMMUNITY): Payer: Self-pay | Admitting: Urology

## 2018-08-10 NOTE — Telephone Encounter (Signed)
Pt contacted pre-catheterization scheduled at [facility] for: Monday August 14, 2018 1 PM Verified arrival time and place: [place] Entrance A at: 8 AM for pre-procedure hydration. Per Dr Ellyn Hack-- BMP STAT on arrival to Short Stay 08/14/18, no LV gram.  No solid food after midnight prior to cath, clear liquids until 5 AM day of procedure. Contrast allergy: no Verified no diabetes medications.  AM meds can be  taken pre-cath with sip of water including: ASA 81 mg Brilinta 90 mg  Confirmed patient has responsible person to drive home post procedure and for 24 hours after you arrive home: yes

## 2018-08-10 NOTE — Anesthesia Postprocedure Evaluation (Signed)
Anesthesia Post Note  Patient: [name]  Procedure(s) Performed: CYSTOSCOPY WITH RETROGRADE PYELOGRAM, URETEROSCOPY AND STENT PLACEMENT (Right ) HOLMIUM LASER APPLICATION (Right )     Patient location during evaluation: PACU Anesthesia Type: General Level of consciousness: awake and alert Pain management: pain level controlled Vital Signs Assessment: post-procedure vital signs reviewed and stable Respiratory status: spontaneous breathing, nonlabored ventilation, respiratory function stable and patient connected to nasal cannula oxygen Cardiovascular status: blood pressure returned to baseline and stable Postop Assessment: no apparent nausea or vomiting Anesthetic complications: no    Last Vitals:  Vitals:   08/09/18 1310 08/09/18 1350  BP: (!) 157/96 (!) 151/96  Pulse: 71 71  Resp: 20 20  Temp: 36.8 C 36.6 C  SpO2: 100% 100%    Last Pain:  Vitals:   08/09/18 1350  TempSrc:   PainSc: 0-No pain                 Chelsey L Woodrum

## 2018-08-11 ENCOUNTER — Other Ambulatory Visit: Payer: Self-pay | Admitting: Cardiology

## 2018-08-14 ENCOUNTER — Encounter (HOSPITAL_COMMUNITY)
Admission: RE | Disposition: A | Discharge status: Home / Self Care | Payer: Self-pay | Source: Home / Self Care | Attending: Cardiology

## 2018-08-14 ENCOUNTER — Other Ambulatory Visit: Payer: Self-pay

## 2018-08-14 ENCOUNTER — Inpatient Hospital Stay (HOSPITAL_COMMUNITY)
Admission: RE | Admit: 2018-08-14 | Discharge: 2018-08-17 | DRG: 286 | Disposition: A | Discharge status: Home / Self Care | Payer: Medicare Other | Attending: Cardiology | Admitting: Cardiology

## 2018-08-14 ENCOUNTER — Encounter (HOSPITAL_COMMUNITY): Payer: Self-pay | Admitting: General Practice

## 2018-08-14 DIAGNOSIS — Z888 Allergy status to other drugs, medicaments and biological substances status: Secondary | ICD-10-CM

## 2018-08-14 DIAGNOSIS — I272 Pulmonary hypertension, unspecified: Secondary | ICD-10-CM | POA: Diagnosis present

## 2018-08-14 DIAGNOSIS — G250 Essential tremor: Secondary | ICD-10-CM | POA: Diagnosis present

## 2018-08-14 DIAGNOSIS — Z951 Presence of aortocoronary bypass graft: Secondary | ICD-10-CM

## 2018-08-14 DIAGNOSIS — Z7982 Long term (current) use of aspirin: Secondary | ICD-10-CM | POA: Diagnosis not present

## 2018-08-14 DIAGNOSIS — I5043 Acute on chronic combined systolic (congestive) and diastolic (congestive) heart failure: Secondary | ICD-10-CM

## 2018-08-14 DIAGNOSIS — G4733 Obstructive sleep apnea (adult) (pediatric): Secondary | ICD-10-CM | POA: Diagnosis present

## 2018-08-14 DIAGNOSIS — I130 Hypertensive heart and chronic kidney disease with heart failure and stage 1 through stage 4 chronic kidney disease, or unspecified chronic kidney disease: Principal | ICD-10-CM | POA: Diagnosis present

## 2018-08-14 DIAGNOSIS — Z9861 Coronary angioplasty status: Secondary | ICD-10-CM

## 2018-08-14 DIAGNOSIS — I2510 Atherosclerotic heart disease of native coronary artery without angina pectoris: Secondary | ICD-10-CM

## 2018-08-14 DIAGNOSIS — I25700 Atherosclerosis of coronary artery bypass graft(s), unspecified, with unstable angina pectoris: Secondary | ICD-10-CM | POA: Diagnosis present

## 2018-08-14 DIAGNOSIS — E876 Hypokalemia: Secondary | ICD-10-CM | POA: Diagnosis not present

## 2018-08-14 DIAGNOSIS — F329 Major depressive disorder, single episode, unspecified: Secondary | ICD-10-CM | POA: Diagnosis present

## 2018-08-14 DIAGNOSIS — I252 Old myocardial infarction: Secondary | ICD-10-CM | POA: Diagnosis not present

## 2018-08-14 DIAGNOSIS — Z6839 Body mass index (BMI) 39.0-39.9, adult: Secondary | ICD-10-CM

## 2018-08-14 DIAGNOSIS — N183 Chronic kidney disease, stage 3 (moderate): Secondary | ICD-10-CM | POA: Diagnosis present

## 2018-08-14 DIAGNOSIS — E785 Hyperlipidemia, unspecified: Secondary | ICD-10-CM | POA: Diagnosis present

## 2018-08-14 DIAGNOSIS — Z955 Presence of coronary angioplasty implant and graft: Secondary | ICD-10-CM | POA: Diagnosis not present

## 2018-08-14 DIAGNOSIS — Z8249 Family history of ischemic heart disease and other diseases of the circulatory system: Secondary | ICD-10-CM

## 2018-08-14 DIAGNOSIS — G8929 Other chronic pain: Secondary | ICD-10-CM | POA: Diagnosis present

## 2018-08-14 DIAGNOSIS — I25810 Atherosclerosis of coronary artery bypass graft(s) without angina pectoris: Secondary | ICD-10-CM | POA: Diagnosis present

## 2018-08-14 DIAGNOSIS — Z7902 Long term (current) use of antithrombotics/antiplatelets: Secondary | ICD-10-CM | POA: Diagnosis not present

## 2018-08-14 DIAGNOSIS — Z833 Family history of diabetes mellitus: Secondary | ICD-10-CM

## 2018-08-14 DIAGNOSIS — Z960 Presence of urogenital implants: Secondary | ICD-10-CM | POA: Diagnosis present

## 2018-08-14 DIAGNOSIS — Z79899 Other long term (current) drug therapy: Secondary | ICD-10-CM | POA: Diagnosis not present

## 2018-08-14 DIAGNOSIS — Z981 Arthrodesis status: Secondary | ICD-10-CM

## 2018-08-14 DIAGNOSIS — N141 Nephropathy induced by other drugs, medicaments and biological substances: Secondary | ICD-10-CM | POA: Diagnosis not present

## 2018-08-14 DIAGNOSIS — R0609 Other forms of dyspnea: Secondary | ICD-10-CM

## 2018-08-14 DIAGNOSIS — I5042 Chronic combined systolic (congestive) and diastolic (congestive) heart failure: Secondary | ICD-10-CM | POA: Diagnosis present

## 2018-08-14 DIAGNOSIS — I25119 Atherosclerotic heart disease of native coronary artery with unspecified angina pectoris: Secondary | ICD-10-CM | POA: Diagnosis present

## 2018-08-14 DIAGNOSIS — I10 Essential (primary) hypertension: Secondary | ICD-10-CM | POA: Diagnosis present

## 2018-08-14 DIAGNOSIS — I5030 Unspecified diastolic (congestive) heart failure: Secondary | ICD-10-CM | POA: Diagnosis not present

## 2018-08-14 DIAGNOSIS — T508X5A Adverse effect of diagnostic agents, initial encounter: Secondary | ICD-10-CM | POA: Diagnosis not present

## 2018-08-14 HISTORY — PX: LEFT HEART CATH AND CORS/GRAFTS ANGIOGRAPHY: CATH118250

## 2018-08-14 HISTORY — PX: RIGHT HEART CATH: CATH118263

## 2018-08-14 HISTORY — DX: Personal history of urinary calculi: Z87.442

## 2018-08-14 HISTORY — DX: Heart failure, unspecified: I50.9

## 2018-08-14 HISTORY — DX: Chronic kidney disease, unspecified: N18.9

## 2018-08-14 LAB — BASIC METABOLIC PANEL
Anion gap: 5 (ref 5–15)
BUN: 19 mg/dL (ref 8–23)
CO2: 22 mmol/L (ref 22–32)
Calcium: 8.8 mg/dL — ABNORMAL LOW (ref 8.9–10.3)
Chloride: 111 mmol/L (ref 98–111)
Creatinine, Ser: 1.72 mg/dL — ABNORMAL HIGH (ref 0.61–1.24)
GFR calc non Af Amer: 41 mL/min — ABNORMAL LOW (ref >60)
GFR, EST AFRICAN AMERICAN: 47 mL/min — AB (ref >60)
Glucose, Bld: 127 mg/dL — ABNORMAL HIGH (ref 70–99)
Potassium: 4.3 mmol/L (ref 3.5–5.1)
SODIUM: 138 mmol/L (ref 135–145)

## 2018-08-14 LAB — CBC
HCT: 41.8 % (ref 39.0–52.0)
Hemoglobin: 13.9 g/dL (ref 13.0–17.0)
MCH: 27.8 pg (ref 26.0–34.0)
MCHC: 33.3 g/dL (ref 30.0–36.0)
MCV: 83.6 fL (ref 80.0–100.0)
Platelets: 329 10*3/uL (ref 150–400)
RBC: 5 MIL/uL (ref 4.22–5.81)
RDW: 14.2 % (ref 11.5–15.5)
WBC: 9 10*3/uL (ref 4.0–10.5)
nRBC: 0 % (ref 0.0–0.2)

## 2018-08-14 LAB — POCT I-STAT 3, VENOUS BLOOD GAS (G3P V)
Acid-base deficit: 6 mmol/L — ABNORMAL HIGH (ref 0.0–2.0)
BICARBONATE: 19.6 mmol/L — AB (ref 20.0–28.0)
O2 Saturation: 62 %
PCO2 VEN: 37.9 mmHg — AB (ref 44.0–60.0)
TCO2: 21 mmol/L — AB (ref 22–32)
pH, Ven: 7.321 (ref 7.250–7.430)
pO2, Ven: 35 mmHg (ref 32.0–45.0)

## 2018-08-14 LAB — CREATININE, SERUM
CREATININE: 1.85 mg/dL — AB (ref 0.61–1.24)
GFR calc Af Amer: 43 mL/min — ABNORMAL LOW (ref >60)
GFR, EST NON AFRICAN AMERICAN: 37 mL/min — AB (ref >60)

## 2018-08-14 SURGERY — LEFT HEART CATH AND CORS/GRAFTS ANGIOGRAPHY
Anesthesia: LOCAL

## 2018-08-14 MED ORDER — LIDOCAINE HCL (PF) 1 % IJ SOLN
INTRAMUSCULAR | Status: AC
  Filled 2018-08-14: qty 30

## 2018-08-14 MED ORDER — FUROSEMIDE 10 MG/ML IJ SOLN
INTRAMUSCULAR | Status: AC
  Filled 2018-08-14: qty 8

## 2018-08-14 MED ORDER — FUROSEMIDE 10 MG/ML IJ SOLN
40.0000 mg | Freq: Two times a day (BID) | INTRAMUSCULAR | Status: DC
  Administered 2018-08-15 (×2): 40 mg via INTRAVENOUS
  Filled 2018-08-14 (×2): qty 4

## 2018-08-14 MED ORDER — AMLODIPINE BESYLATE 5 MG PO TABS
5.0000 mg | ORAL_TABLET | Freq: Every day | ORAL | Status: DC
  Administered 2018-08-14: 18:00:00 5 mg via ORAL
  Filled 2018-08-14: qty 1

## 2018-08-14 MED ORDER — SODIUM CHLORIDE 0.9 % IV SOLN: 250.0000 mL | INTRAVENOUS | Status: DC | PRN

## 2018-08-14 MED ORDER — HEPARIN (PORCINE) IN NACL 1000-0.9 UT/500ML-% IV SOLN
INTRAVENOUS | Status: DC | PRN
  Administered 2018-08-14 (×4): 500 mL

## 2018-08-14 MED ORDER — FENTANYL CITRATE (PF) 100 MCG/2ML IJ SOLN
INTRAMUSCULAR | Status: AC
  Filled 2018-08-14: qty 2

## 2018-08-14 MED ORDER — ONDANSETRON HCL 4 MG/2ML IJ SOLN: 4.0000 mg | Freq: Four times a day (QID) | INTRAMUSCULAR | Status: DC | PRN

## 2018-08-14 MED ORDER — NITROGLYCERIN 0.4 MG SL SUBL: 0.4000 mg | SUBLINGUAL_TABLET | SUBLINGUAL | Status: DC | PRN

## 2018-08-14 MED ORDER — VERAPAMIL HCL 2.5 MG/ML IV SOLN
INTRAVENOUS | Status: DC | PRN
  Administered 2018-08-14: 10 mL via INTRA_ARTERIAL

## 2018-08-14 MED ORDER — SODIUM CHLORIDE 0.9% FLUSH: 3.0000 mL | INTRAVENOUS | Status: DC | PRN

## 2018-08-14 MED ORDER — FUROSEMIDE 10 MG/ML IJ SOLN
INTRAMUSCULAR | Status: DC | PRN
  Administered 2018-08-14: 80 mg via INTRAVENOUS

## 2018-08-14 MED ORDER — VERAPAMIL HCL 2.5 MG/ML IV SOLN
INTRAVENOUS | Status: AC
  Filled 2018-08-14: qty 2

## 2018-08-14 MED ORDER — SODIUM CHLORIDE 0.9% FLUSH: 3.0000 mL | Freq: Two times a day (BID) | INTRAVENOUS | Status: DC

## 2018-08-14 MED ORDER — TICAGRELOR 90 MG PO TABS
90.0000 mg | ORAL_TABLET | Freq: Two times a day (BID) | ORAL | Status: DC
  Administered 2018-08-14 – 2018-08-17 (×5): 90 mg via ORAL
  Filled 2018-08-14 (×5): qty 1

## 2018-08-14 MED ORDER — HEPARIN SODIUM (PORCINE) 1000 UNIT/ML IJ SOLN
INTRAMUSCULAR | Status: DC | PRN
  Administered 2018-08-14: 6000 [IU] via INTRAVENOUS

## 2018-08-14 MED ORDER — MIDAZOLAM HCL 2 MG/2ML IJ SOLN
INTRAMUSCULAR | Status: DC | PRN
  Administered 2018-08-14: 1 mg via INTRAVENOUS
  Administered 2018-08-14: 2 mg via INTRAVENOUS
  Administered 2018-08-14: 1 mg via INTRAVENOUS

## 2018-08-14 MED ORDER — ASPIRIN 81 MG PO CHEW: 81.0000 mg | CHEWABLE_TABLET | ORAL | Status: DC

## 2018-08-14 MED ORDER — CEPHALEXIN 250 MG PO CAPS
500.0000 mg | ORAL_CAPSULE | Freq: Two times a day (BID) | ORAL | Status: DC
  Administered 2018-08-16: 500 mg via ORAL
  Filled 2018-08-14 (×2): qty 2
  Filled 2018-08-14: qty 1
  Filled 2018-08-14 (×2): qty 2
  Filled 2018-08-14 (×2): qty 1

## 2018-08-14 MED ORDER — ISOSORBIDE MONONITRATE ER 30 MG PO TB24
30.0000 mg | ORAL_TABLET | Freq: Every day | ORAL | Status: DC
  Administered 2018-08-14 – 2018-08-17 (×4): 30 mg via ORAL
  Filled 2018-08-14 (×4): qty 1

## 2018-08-14 MED ORDER — ACETAMINOPHEN 325 MG PO TABS: 650.0000 mg | ORAL_TABLET | ORAL | Status: DC | PRN

## 2018-08-14 MED ORDER — SODIUM CHLORIDE 0.9 % WEIGHT BASED INFUSION
3.0000 mL/kg/h | INTRAVENOUS | Status: DC
  Administered 2018-08-14: 3 mL/kg/h via INTRAVENOUS

## 2018-08-14 MED ORDER — PROPRANOLOL HCL 20 MG PO TABS
20.0000 mg | ORAL_TABLET | Freq: Three times a day (TID) | ORAL | Status: DC
  Administered 2018-08-14 – 2018-08-17 (×7): 20 mg via ORAL
  Filled 2018-08-14 (×11): qty 1

## 2018-08-14 MED ORDER — FENTANYL CITRATE (PF) 100 MCG/2ML IJ SOLN
INTRAMUSCULAR | Status: DC | PRN
  Administered 2018-08-14 (×3): 25 ug via INTRAVENOUS

## 2018-08-14 MED ORDER — HEPARIN (PORCINE) IN NACL 1000-0.9 UT/500ML-% IV SOLN
INTRAVENOUS | Status: AC
  Filled 2018-08-14: qty 1500

## 2018-08-14 MED ORDER — ASPIRIN 81 MG PO CHEW
CHEWABLE_TABLET | ORAL | Status: AC
  Administered 2018-08-14: 81 mg
  Filled 2018-08-14: qty 1

## 2018-08-14 MED ORDER — MIDAZOLAM HCL 2 MG/2ML IJ SOLN
INTRAMUSCULAR | Status: AC
  Filled 2018-08-14: qty 2

## 2018-08-14 MED ORDER — CLONAZEPAM 0.5 MG PO TABS
2.0000 mg | ORAL_TABLET | Freq: Every day | ORAL | Status: DC
  Administered 2018-08-14 – 2018-08-16 (×3): 2 mg via ORAL
  Filled 2018-08-14 (×3): qty 4

## 2018-08-14 MED ORDER — OXYCODONE-ACETAMINOPHEN 5-325 MG PO TABS
1.0000 | ORAL_TABLET | Freq: Four times a day (QID) | ORAL | Status: DC | PRN
  Administered 2018-08-15: 03:00:00 1 via ORAL
  Administered 2018-08-16 (×3): 2 via ORAL
  Filled 2018-08-14 (×2): qty 2
  Filled 2018-08-14: qty 1
  Filled 2018-08-14: qty 2

## 2018-08-14 MED ORDER — SODIUM CHLORIDE 0.9 % IV SOLN
INTRAVENOUS | Status: AC
  Administered 2018-08-14: 18:00:00 via INTRAVENOUS

## 2018-08-14 MED ORDER — TICAGRELOR 90 MG PO TABS
90.0000 mg | ORAL_TABLET | Freq: Once | ORAL | Status: AC
  Administered 2018-08-14: 90 mg via ORAL
  Filled 2018-08-14: qty 1

## 2018-08-14 MED ORDER — IOHEXOL 350 MG/ML SOLN
INTRAVENOUS | Status: DC | PRN
  Administered 2018-08-14: 150 mL via INTRA_ARTERIAL

## 2018-08-14 MED ORDER — LIDOCAINE HCL (PF) 1 % IJ SOLN
INTRAMUSCULAR | Status: DC | PRN
  Administered 2018-08-14: 2 mL via INTRADERMAL
  Administered 2018-08-14: 3 mL via INTRADERMAL

## 2018-08-14 MED ORDER — BUPROPION HCL ER (SR) 150 MG PO TB12
150.0000 mg | ORAL_TABLET | Freq: Two times a day (BID) | ORAL | Status: DC
  Administered 2018-08-14 – 2018-08-17 (×6): 150 mg via ORAL
  Filled 2018-08-14 (×7): qty 1

## 2018-08-14 MED ORDER — SODIUM CHLORIDE 0.9 % WEIGHT BASED INFUSION: 1.0000 mL/kg/h | INTRAVENOUS | Status: DC

## 2018-08-14 MED ORDER — EVOLOCUMAB 140 MG/ML ~~LOC~~ SOAJ: 140.0000 mg | SUBCUTANEOUS | Status: DC

## 2018-08-14 MED ORDER — HEPARIN (PORCINE) IN NACL 1000-0.9 UT/500ML-% IV SOLN
INTRAVENOUS | Status: AC
  Filled 2018-08-14: qty 500

## 2018-08-14 MED ORDER — SODIUM CHLORIDE 0.9% FLUSH
3.0000 mL | Freq: Two times a day (BID) | INTRAVENOUS | Status: DC
  Administered 2018-08-14 – 2018-08-17 (×6): 3 mL via INTRAVENOUS

## 2018-08-14 MED ORDER — CYCLOBENZAPRINE HCL 10 MG PO TABS
10.0000 mg | ORAL_TABLET | Freq: Three times a day (TID) | ORAL | Status: DC | PRN
  Administered 2018-08-15: 10 mg via ORAL
  Filled 2018-08-14 (×2): qty 1

## 2018-08-14 MED ORDER — HEPARIN SODIUM (PORCINE) 5000 UNIT/ML IJ SOLN
5000.0000 [IU] | Freq: Three times a day (TID) | INTRAMUSCULAR | Status: DC
  Administered 2018-08-14 – 2018-08-16 (×5): 5000 [IU] via SUBCUTANEOUS
  Filled 2018-08-14 (×8): qty 1

## 2018-08-14 SURGICAL SUPPLY — 17 items
CATH BALLN WEDGE 5F 110CM (CATHETERS) ×1 IMPLANT
CATH EXPO 5F MPA-1 (CATHETERS) ×1 IMPLANT
CATH INFINITI 5 FR IM (CATHETERS) ×1 IMPLANT
CATH INFINITI 5FR AL1 (CATHETERS) ×1 IMPLANT
CATH INFINITI 5FR MULTPACK ANG (CATHETERS) ×1 IMPLANT
CATH LAUNCHER 5F EBU3.5 (CATHETERS) ×1 IMPLANT
DEVICE RAD COMP TR BAND LRG (VASCULAR PRODUCTS) ×1 IMPLANT
GLIDESHEATH SLEND A-KIT 6F 22G (SHEATH) ×1 IMPLANT
GUIDEWIRE INQWIRE 1.5J.035X260 (WIRE) IMPLANT
INQWIRE 1.5J .035X260CM (WIRE) ×4
KIT HEART LEFT (KITS) ×2 IMPLANT
PACK CARDIAC CATHETERIZATION (CUSTOM PROCEDURE TRAY) ×2 IMPLANT
SHEATH GLIDE SLENDER 4/5FR (SHEATH) ×1 IMPLANT
SHEATH PROBE COVER 6X72 (BAG) ×2 IMPLANT
SHIELD RADPAD SCOOP 12X17 (MISCELLANEOUS) ×1 IMPLANT
TRANSDUCER W/STOPCOCK (MISCELLANEOUS) ×2 IMPLANT
TUBING CIL FLEX 10 FLL-RA (TUBING) ×2 IMPLANT

## 2018-08-14 NOTE — Interval H&P Note (Signed)
History and Physical Interval Note:  08/14/2018 3:08 PM  [name]  has presented today for surgery, with the diagnosis of excertional cp/dyspnea.  Known CAD-CABG and PCI the various methods of treatment have been discussed with the patient and family. After consideration of risks, benefits and other options for treatment, the patient has consented to  Procedure(s): LEFT HEART CATH AND CORS/GRAFTS ANGIOGRAPHY (N/A) with possible PERCUTANEOUS CORONARY INTERVENTION as a surgical intervention .    The patient's history has been reviewed, patient examined, no change in status, stable for surgery.  I have reviewed the patient's chart and labs.  Questions were answered to the patient's satisfaction.   Cath Lab Visit (complete for each Cath Lab visit)  Clinical Evaluation Leading to the Procedure:   ACS: No.  Non-ACS:    Anginal Classification: CCS III  Anti-ischemic medical therapy: Minimal Therapy (1 class of medications)  Non-Invasive Test Results: No non-invasive testing performed  Prior CABG: Previous CABG    [name]

## 2018-08-15 ENCOUNTER — Encounter (HOSPITAL_COMMUNITY): Payer: Self-pay | Admitting: Cardiology

## 2018-08-15 ENCOUNTER — Inpatient Hospital Stay (HOSPITAL_COMMUNITY): Payer: Medicare Other

## 2018-08-15 DIAGNOSIS — I5030 Unspecified diastolic (congestive) heart failure: Secondary | ICD-10-CM

## 2018-08-15 HISTORY — PX: TRANSTHORACIC ECHOCARDIOGRAM: SHX275

## 2018-08-15 LAB — POCT I-STAT 3, ART BLOOD GAS (G3+)
ACID-BASE DEFICIT: 6 mmol/L — AB (ref 0.0–2.0)
BICARBONATE: 19.7 mmol/L — AB (ref 20.0–28.0)
O2 Saturation: 97 %
PO2 ART: 98 mmHg (ref 83.0–108.0)
TCO2: 21 mmol/L — AB (ref 22–32)
pCO2 arterial: 36.6 mmHg (ref 32.0–48.0)
pH, Arterial: 7.338 — ABNORMAL LOW (ref 7.350–7.450)

## 2018-08-15 LAB — POCT I-STAT 3, VENOUS BLOOD GAS (G3P V)
Acid-base deficit: 4 mmol/L — ABNORMAL HIGH (ref 0.0–2.0)
BICARBONATE: 21.5 mmol/L (ref 20.0–28.0)
O2 Saturation: 62 %
TCO2: 23 mmol/L (ref 22–32)
pCO2, Ven: 41.8 mmHg — ABNORMAL LOW (ref 44.0–60.0)
pH, Ven: 7.32 (ref 7.250–7.430)
pO2, Ven: 35 mmHg (ref 32.0–45.0)

## 2018-08-15 LAB — ECHOCARDIOGRAM COMPLETE
Height: 71 in
Weight: 4458.58 oz

## 2018-08-15 MED ORDER — ASPIRIN EC 81 MG PO TBEC
81.0000 mg | DELAYED_RELEASE_TABLET | Freq: Every day | ORAL | Status: DC
  Administered 2018-08-15 – 2018-08-17 (×3): 81 mg via ORAL
  Filled 2018-08-15 (×3): qty 1

## 2018-08-15 MED ORDER — PERFLUTREN LIPID MICROSPHERE
1.0000 mL | INTRAVENOUS | Status: DC | PRN
  Administered 2018-08-15: 10:00:00 3 mL via INTRAVENOUS
  Filled 2018-08-15: qty 10

## 2018-08-15 MED ORDER — EVOLOCUMAB 140 MG/ML ~~LOC~~ SOAJ: 140.0000 mg | SUBCUTANEOUS | Status: DC

## 2018-08-15 NOTE — Progress Notes (Signed)
  Echocardiogram 2D Echocardiogram has been performed.  [name] 08/15/2018, 10:03 AM

## 2018-08-15 NOTE — Progress Notes (Signed)
Chaplain on-call rec'd page from nurse that pt wished information on AD.  Chaplain responded to page and met with pt and his daughter.  Chaplain explained the Advanced Directive form to them and will make a referral for the unit chaplain to visit in the morning when witnesses and notary are available. Tamsen Snider  pager [phone]

## 2018-08-15 NOTE — Progress Notes (Addendum)
Progress Note  Patient Name: [name] Date of Encounter: 08/15/2018  Primary Cardiologist: [name], MD   Subjective   No chest pain; mild DOE  Inpatient Medications    Scheduled Meds: . amLODipine  5 mg Oral Daily  . buPROPion  150 mg Oral BID  . cephALEXin  500 mg Oral BID  . clonazePAM  2 mg Oral QHS  . Evolocumab  140 mg Subcutaneous Q14 Days  . furosemide  40 mg Intravenous BID  . heparin  5,000 Units Subcutaneous Q8H  . isosorbide mononitrate  30 mg Oral Daily  . propranolol  20 mg Oral TID  . sodium chloride flush  3 mL Intravenous Q12H  . ticagrelor  90 mg Oral BID   Continuous Infusions: . sodium chloride     PRN Meds: sodium chloride, acetaminophen, cyclobenzaprine, nitroGLYCERIN, ondansetron (ZOFRAN) IV, oxyCODONE-acetaminophen, sodium chloride flush   Vital Signs    Vitals:   08/14/18 1700 08/14/18 1800 08/14/18 2003 08/15/18 0625  BP:  (!) 203/95 125/76 94/66  Pulse: (!) 189 66 82 73  Resp:  15 (!) 26 15  Temp:   97.9 F (36.6 C) 97.6 F (36.4 C)  TempSrc:   Oral Oral  SpO2:  100% 98% 96%  Weight:    126.4 kg  Height:        Intake/Output Summary (Last 24 hours) at 08/15/2018 0804 Last data filed at 08/15/2018 2536 Gross per 24 hour  Intake 720 ml  Output 4825 ml  Net -4105 ml   Filed Weights   08/14/18 0902 08/15/18 0625  Weight: 122.5 kg 126.4 kg    Telemetry    Sinus with PVCs - Personally Reviewed   Physical Exam   GEN: No acute distress.  Obese Neck: supple Cardiac: RRR Respiratory: Clear to auscultation bilaterally. GI: Soft, nontender, non-distended  MS: No edema Neuro:  Nonfocal  Psych: Normal affect   Labs    Chemistry Recent Labs  Lab 08/09/18 0926 08/14/18 1018 08/14/18 1958  NA 138 138  --   K 4.1 4.3  --   CL 106 111  --   CO2 22 22  --   GLUCOSE 126* 127*  --   BUN 24* 19  --   CREATININE 1.74* 1.72* 1.85*  CALCIUM 9.6 8.8*  --   GFRNONAA 40* 41* 37*  GFRAA 47* 47* 43*  ANIONGAP 10  5  --      Hematology Recent Labs  Lab 08/09/18 0926 08/14/18 1958  WBC 8.5 9.0  RBC 5.28 5.00  HGB 14.7 13.9  HCT 44.5 41.8  MCV 84.3 83.6  MCH 27.8 27.8  MCHC 33.0 33.3  RDW 14.2 14.2  PLT 300 329     Patient Profile     62 y.o. male with past medical history of coronary artery disease status post coronary artery bypass and graft, chronic stage III kidney disease, hypertension, combined systolic/diastolic congestive heart failure admitted following elective cardiac catheterization for diuresis and monitoring of renal function.  Assessment & Plan    1 acute on chronic combined systolic/diastolic congestive heart failure-left ventricular end-diastolic pressure was elevated at time of catheterization.  Symptoms seem most consistent with CHF. I/O - 4105. We will continue Lasix 40 mg IV twice daily.  Follow renal function closely.  Needs fluid restriction and low-sodium diet.  Schedule echocardiogram to assess LV function.  2 coronary artery disease status post coronary artery bypass and graft-cath results noted.  Plan medical therapy.  Continue aspirin,  Brilinta and beta-blocker.  Continue isosorbide.  Patient is intolerant to statins.  3 chronic stage III kidney disease-follow renal function closely following administration of contrast and diuresis.  4 hypertension-blood pressure is borderline.  Hold amlodipine.  Continue to follow and adjust regimen as needed.  5 hyperlipidemia-continue Repatha.  For questions or updates, please contact [place] Please consult [email or web address] for contact info under        Signed, Kirk Ruths, MD  08/15/2018, 8:04 AM

## 2018-08-15 NOTE — Progress Notes (Signed)
TR BAND REMOVAL  LOCATION:    left radial  DEFLATED PER PROTOCOL:    Yes.    TIME BAND OFF / DRESSING APPLIED:    1930   SITE UPON ARRIVAL:    Level 0  SITE AFTER BAND REMOVAL:    Level 0  CIRCULATION SENSATION AND MOVEMENT:    Within Normal Limits   Yes.    COMMENTS:   Post TR Band instructions given, DSD applied, good cap refill

## 2018-08-16 LAB — BASIC METABOLIC PANEL
ANION GAP: 7 (ref 5–15)
BUN: 27 mg/dL — ABNORMAL HIGH (ref 8–23)
CHLORIDE: 103 mmol/L (ref 98–111)
CO2: 25 mmol/L (ref 22–32)
Calcium: 9 mg/dL (ref 8.9–10.3)
Creatinine, Ser: 2.35 mg/dL — ABNORMAL HIGH (ref 0.61–1.24)
GFR calc Af Amer: 32 mL/min — ABNORMAL LOW (ref >60)
GFR calc non Af Amer: 28 mL/min — ABNORMAL LOW (ref >60)
GLUCOSE: 127 mg/dL — AB (ref 70–99)
POTASSIUM: 3.7 mmol/L (ref 3.5–5.1)
Sodium: 135 mmol/L (ref 135–145)

## 2018-08-16 NOTE — Progress Notes (Signed)
RT offered to place pt on CPAP for the night. Pt states he can apply CPAP for himself. RT checked H2O reservoir (full). RT will continue to monitor.

## 2018-08-16 NOTE — Progress Notes (Signed)
I responded to the patient's request to complete an Advance Directive. I visited the patient's room and reviewed the completed document with him. I contacted our Notary to meet with [place] to complete the document. While we were waiting for the Notary and witnesses to arrive, I provided spiritual support by listening to the patient with compassion as he talked about his health status and his faith. I shared words of encouragement. After the AD was witnessed and notarized, I provided copies for the patient and for the nurse's desk.    08/16/18 1100  Clinical Encounter Type  Visited With Patient  Visit Type Follow-up;Spiritual support;Other (Comment)  Referral From Nurse  Consult/Referral To Chaplain  Spiritual Encounters  Spiritual Needs Literature;Prayer  Stress Factors  Patient Stress Factors None identified    Chaplain Dr Redgie Grayer

## 2018-08-16 NOTE — Plan of Care (Signed)
?  Problem: Education: ?Goal: Knowledge of General Education information will improve ?Description: Including pain rating scale, medication(s)/side effects and non-pharmacologic comfort measures ?Outcome: Progressing ?  ?Problem: Activity: ?Goal: Capacity to carry out activities will improve ?Outcome: Progressing ?  ?

## 2018-08-16 NOTE — Progress Notes (Signed)
Progress Note  Patient Name: [name] Date of Encounter: 08/16/2018  Primary Cardiologist: [name], MD   Subjective   Dyspnea improving; no CP   Inpatient Medications    Scheduled Meds: . aspirin EC  81 mg Oral Daily  . buPROPion  150 mg Oral BID  . cephALEXin  500 mg Oral BID  . clonazePAM  2 mg Oral QHS  . [START ON 08/27/2018] Evolocumab  140 mg Subcutaneous Q14 Days  . furosemide  40 mg Intravenous BID  . heparin  5,000 Units Subcutaneous Q8H  . isosorbide mononitrate  30 mg Oral Daily  . propranolol  20 mg Oral TID  . sodium chloride flush  3 mL Intravenous Q12H  . ticagrelor  90 mg Oral BID   Continuous Infusions: . sodium chloride     PRN Meds: sodium chloride, acetaminophen, cyclobenzaprine, nitroGLYCERIN, ondansetron (ZOFRAN) IV, oxyCODONE-acetaminophen, sodium chloride flush   Vital Signs    Vitals:   08/15/18 2226 08/15/18 2330 08/15/18 2344 08/16/18 0613  BP: 128/81  (!) 103/59 (!) 141/80  Pulse: 72 75 69 67  Resp:  18 18 18   Temp:   98 F (36.7 C) 98 F (36.7 C)  TempSrc:      SpO2:  95%    Weight:    124.8 kg  Height:        Intake/Output Summary (Last 24 hours) at 08/16/2018 0815 Last data filed at 08/16/2018 0753 Gross per 24 hour  Intake 480 ml  Output 2325 ml  Net -1845 ml   Filed Weights   08/15/18 1705 08/15/18 1800 08/16/18 0613  Weight: 125.9 kg 125.9 kg 124.8 kg    Telemetry    Sinus- Personally Reviewed   Physical Exam   GEN: WD Obese NAD Neck: supple, no JVD Cardiac: RRR, no mumur Respiratory: CTA GI: Soft, obese MS: No edema; radial cath site with no hematoma Neuro:  Grossly intact  Labs    Chemistry Recent Labs  Lab 08/09/18 0926 08/14/18 1018 08/14/18 1958 08/16/18 0500  NA 138 138  --  135  K 4.1 4.3  --  3.7  CL 106 111  --  103  CO2 22 22  --  25  GLUCOSE 126* 127*  --  127*  BUN 24* 19  --  27*  CREATININE 1.74* 1.72* 1.85* 2.35*  CALCIUM 9.6 8.8*  --  9.0  GFRNONAA 40* 41* 37*  28*  GFRAA 47* 47* 43* 32*  ANIONGAP 10 5  --  7     Hematology Recent Labs  Lab 08/09/18 0926 08/14/18 1958  WBC 8.5 9.0  RBC 5.28 5.00  HGB 14.7 13.9  HCT 44.5 41.8  MCV 84.3 83.6  MCH 27.8 27.8  MCHC 33.0 33.3  RDW 14.2 14.2  PLT 300 329     Patient Profile     62 y.o. male with past medical history of coronary artery disease status post coronary artery bypass and graft, chronic stage III kidney disease, hypertension, combined systolic/diastolic congestive heart failure admitted following elective cardiac catheterization for diuresis and monitoring of renal function.  Assessment & Plan    1 acute on chronic combined systolic/diastolic congestive heart failure-symptoms have improved.  I/O - [id number].  Echocardiogram shows ejection fraction 45%.  Renal function is worse today likely from combination of diuresis and contrast nephropathy.  Hold Lasix today.  He will need low-dose diuretic at discharge.  2 coronary artery disease status post coronary artery bypass and graft-plan is medical  therapy.  Continue aspirin and Brilinta.  He is intolerant to statins.  3 chronic stage III kidney disease-renal function worse likely secondary to diuresis and contrast.  Hold diuretics today.  Recheck potassium and renal function tomorrow.  4 hypertension-blood pressure is controlled.  Continue present medications and follow.  5 hyperlipidemia-treated with repatha.  For questions or updates, please contact [place] Please consult [email or web address] for contact info under        Signed, Kirk Ruths, MD  08/16/2018, 8:15 AM

## 2018-08-17 LAB — BASIC METABOLIC PANEL
Anion gap: 10 (ref 5–15)
BUN: 24 mg/dL — AB (ref 8–23)
CHLORIDE: 101 mmol/L (ref 98–111)
CO2: 23 mmol/L (ref 22–32)
CREATININE: 2.09 mg/dL — AB (ref 0.61–1.24)
Calcium: 8.9 mg/dL (ref 8.9–10.3)
GFR calc Af Amer: 37 mL/min — ABNORMAL LOW (ref >60)
GFR calc non Af Amer: 32 mL/min — ABNORMAL LOW (ref >60)
GLUCOSE: 144 mg/dL — AB (ref 70–99)
POTASSIUM: 3.2 mmol/L — AB (ref 3.5–5.1)
Sodium: 134 mmol/L — ABNORMAL LOW (ref 135–145)

## 2018-08-17 MED ORDER — FUROSEMIDE 40 MG PO TABS: 40.0000 mg | ORAL_TABLET | Freq: Every day | ORAL | 1 refills | Status: DC

## 2018-08-17 MED ORDER — ASPIRIN 81 MG PO TBEC: 81.0000 mg | DELAYED_RELEASE_TABLET | Freq: Every day | ORAL | Status: DC

## 2018-08-17 MED ORDER — POTASSIUM CHLORIDE CRYS ER 20 MEQ PO TBCR
40.0000 meq | EXTENDED_RELEASE_TABLET | Freq: Once | ORAL | Status: AC
  Administered 2018-08-17: 40 meq via ORAL
  Filled 2018-08-17: qty 2

## 2018-08-17 NOTE — Progress Notes (Signed)
Progress Note  Patient Name: [name] Date of Encounter: 08/17/2018  Primary Cardiologist: [name], MD   Subjective   No dyspnea or CP  Inpatient Medications    Scheduled Meds: . aspirin EC  81 mg Oral Daily  . buPROPion  150 mg Oral BID  . cephALEXin  500 mg Oral BID  . clonazePAM  2 mg Oral QHS  . [START ON 08/27/2018] Evolocumab  140 mg Subcutaneous Q14 Days  . heparin  5,000 Units Subcutaneous Q8H  . isosorbide mononitrate  30 mg Oral Daily  . propranolol  20 mg Oral TID  . sodium chloride flush  3 mL Intravenous Q12H  . ticagrelor  90 mg Oral BID   Continuous Infusions: . sodium chloride     PRN Meds: sodium chloride, acetaminophen, cyclobenzaprine, nitroGLYCERIN, ondansetron (ZOFRAN) IV, oxyCODONE-acetaminophen, sodium chloride flush   Vital Signs    Vitals:   08/16/18 0613 08/16/18 1204 08/16/18 1919 08/17/18 0544  BP: (!) 141/80 132/65 125/81 115/73  Pulse: 67 76 64 (!) 59  Resp: 18 18 18 18   Temp: 98 F (36.7 C) 98.7 F (37.1 C) 97.9 F (36.6 C) 98 F (36.7 C)  TempSrc:  Oral Oral Oral  SpO2:  93% 96% 98%  Weight: 124.8 kg   126.2 kg  Height:        Intake/Output Summary (Last 24 hours) at 08/17/2018 0735 Last data filed at 08/17/2018 0500 Gross per 24 hour  Intake 1080 ml  Output 2075 ml  Net -995 ml   Filed Weights   08/15/18 1800 08/16/18 0613 08/17/18 0544  Weight: 125.9 kg 124.8 kg 126.2 kg    Telemetry    Sinus with PVCs and isolated couplet- Personally Reviewed   Physical Exam   GEN: WD Obese no acute distress Neck: supple Cardiac: RRR Respiratory: CTA; no wheeze GI: Soft, obese, no masses MS: No edema Neuro:  No focal findings  Labs    Chemistry Recent Labs  Lab 08/14/18 1018 08/14/18 1958 08/16/18 0500 08/17/18 0444  NA 138  --  135 134*  K 4.3  --  3.7 3.2*  CL 111  --  103 101  CO2 22  --  25 23  GLUCOSE 127*  --  127* 144*  BUN 19  --  27* 24*  CREATININE 1.72* 1.85* 2.35* 2.09*  CALCIUM  8.8*  --  9.0 8.9  GFRNONAA 41* 37* 28* 32*  GFRAA 47* 43* 32* 37*  ANIONGAP 5  --  7 10     Hematology Recent Labs  Lab 08/14/18 1958  WBC 9.0  RBC 5.00  HGB 13.9  HCT 41.8  MCV 83.6  MCH 27.8  MCHC 33.3  RDW 14.2  PLT 329     Patient Profile     62 y.o. male with past medical history of coronary artery disease status post coronary artery bypass and graft, chronic stage III kidney disease, hypertension, combined systolic/diastolic congestive heart failure admitted following elective cardiac catheterization for diuresis and monitoring of renal function.  Assessment & Plan    1 acute on chronic combined systolic/diastolic congestive heart failure-symptoms have improved.  I/O - 995.  Echocardiogram shows ejection fraction 45%.  Renal function is improving this morning.  I will not give Lasix today but will resume 40 mg daily tomorrow.  Patient instructed on low-sodium diet and fluid restriction to 1 L daily.    2 coronary artery disease status post coronary artery bypass and graft-no chest pain.  Continue aspirin and Brilinta.  He is intolerant to statins.  3 chronic stage III kidney disease-renal function improved this morning. Recent decline likely related to initiation of Lasix and contrast nephropathy.  I will hold Lasix again today.  Resume 40 mg daily tomorrow.  4 hypertension-blood pressure is controlled.  Continue present medications.  5 hyperlipidemia-treated with repatha.  6 hypokalemia-supplement  Plan discharge today.  Continue present medications.  Resume Lasix 40 mg daily tomorrow.  I will arrange transition of care appointment with APP in 1 week.  Check potassium and renal function at that time.  Follow-up Dr. Ellyn Hack 8 to 12 weeks.  >30 min PA and physician time  D2  For questions or updates, please contact [name] Please consult [email or web address] for contact info under        Signed, Kirk Ruths, MD  08/17/2018, 7:35 AM

## 2018-08-17 NOTE — Discharge Summary (Signed)
Discharge Summary    Patient ID: [name],  MRN: [id number], DOB/AGE: 62-Jul-1957 62 y.o.  Admit date: 08/14/2018 Discharge date: 08/17/2018  Primary Care Provider: Meryle Ready Primary Cardiologist: Dr. Ellyn Hack  Discharge Diagnoses    Active Problems:   CAD S/P percutaneous coronary angioplasty   Dyslipidemia, with intolerance to Crestor and zocor and possible cough with Lipitor   DOE (dyspnea on exertion), anginal equivalant   CAD-CABG 2002 (LIMA-LAD, VG-ramus intermediate, SVG-OM, SVG-PDA) s/p Cath 11/29/12-total occulsion of SVG-OM, EF of 45%   Uncontrolled hypertension   Morbid obesity - BMI 39 with multple CRFs.   Acute on chronic combined systolic and diastolic CHF (congestive heart failure) (HCC)   Allergies Allergies  Allergen Reactions  . Crestor [Rosuvastatin] Other (See Comments)    REACTION: Muscle aches  . Lipitor [Atorvastatin] Cough    ? cough  . Lisinopril Cough    CHANGE TO LOSARTAN  . Zocor [Simvastatin] Other (See Comments)    REACTION: Muscle aches    Diagnostic Studies/Procedures    Cath: 08/14/18   Ost LAD to Prox LAD lesion is 90% stenosed. Mid LAD lesion is 70% stenosed. Prox LAD lesion is 100% stenosed.  Prox Cx to Mid Cx lesion is 100% stenosed. Ost 2nd Mrg to 2nd Mrg lesion is 100% stenosed. Dist Cx lesion is 85% stenosed.  Prox RCA to Dist RCA lesion is 100% stenosed. Ost RPDA lesion is 45% stenosed.  Ost Ramus-1 lesion is 100% stenosed. Ost Ramus-2 lesion is 80% stenosed.  Previously placed Ramus drug eluting stent (from graft into native vessel) is widely patent.  SVG-RI graft was visualized by angiography and is large. Previously placed Insertion drug eluting stent is widely patent.  SVG-rPDA was visualized by angiography and is large. Origin to Mid Graft stented segment has roughly 20% in-stent restenosis however the ostial segment may be this may be close to 40%. But not flow-limiting. Mid Graft to Dist Graft lesion  is 45% stenosed. Dist Graft lesion is 60% stenosed.  LIMA-LAD graft was visualized by angiography and is normal in caliber and large.  SVG-OM graft was visualized by angiography and is normal in caliber. Origin lesion is 100% stenosed (CTO).  -----------------------------------------------------------------------------  Hemodynamic findings consistent with mild pulmonary hypertension.  There is moderate left ventricular systolic dysfunction. The left ventricular ejection fraction is 35-45% by visual estimate.  LV end diastolic pressure is severely elevated.   SUMMARY  Overall angiographically stable coronary disease with patent SVG-RCA, SVG-RI and LIMA-LAD.  Severe native disease.  Only mild progression in the ostial SVG-RCA lesion that was recently dilated on last cath-PCI.  Conflicting data with severely elevated LVEDP in the setting of systemic hypertension, that does not correlate with Wedge Pressure of 18 to 20 mmHg.  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  I suspect that his dyspnea is related to volume overload associated with acute on chronic combined systolic and diastolic heart failure.  This is likely exacerbated by the fact that he recently had urologic procedures with obstructive uropathy and renal insufficiency.  Several medications were held.  RECOMMENDATIONS  Plan is to admit for short-term admission for diuresis.  I am leery of diuresing him in the outpatient setting given his recent renal insufficiency.  I would much rather monitor him closely.  Would anticipate a short stay after IV diuresis.  Started with IV Lasix 40 mg twice daily -titrate accordingly.  (He says he may be 10 pounds up)  Restart amlodipine.  He  is on propranolol for tremor as his beta-blocker (worked better than other beta-blocker).  May consider adding hydralazine depending on his blood pressures.  Continue other home medications.  Check 2D echocardiogram to confirm  EF  Recommend continued Ticagrelor 90mg  twice daily given the extent of his CAD/stents.  [name], M.D., M.S.  TTE: 08/15/18  Study Conclusions  - Left ventricle: The cavity size was normal. Systolic function was   normal. The estimated ejection fraction was 45%. Regional wall   motion abnormalities cannot be excluded due to image quality,   specifically, possible hypokinesis of the inferior wall from base   to apex. Doppler parameters are consistent with abnormal left   ventricular relaxation (grade 1 diastolic dysfunction). Doppler   parameters are consistent with high ventricular filling pressure   (E/e&' > 15). - Aortic valve: Trileaflet; normal thickness leaflets.   Transvalvular velocity was within the normal range. There was no   stenosis. There was no regurgitation. - Mitral valve: Calcified annulus. Transvalvular velocity was   within the normal range. There was no evidence for stenosis.   There was trivial regurgitation. - Left atrium: The atrium was mildly dilated by visual estimate. - Right ventricle: Poorly visualized. The cavity size was normal   when evaluated in limited acoustic windows. Wall thickness could   not be accurately determined. Systolic function was mildly   reduced when evaluated in limited acoustic windows. - Tricuspid valve: There was trivial regurgitation.  Impressions:  - Poor acoustic windows despite the use of Definity for left   ventricular enhancement. Estimated ejection fraction 45%.   Possible inferior regional wall motion abnormality, cannot   exclude other regional wall motion abnormalities. RV poorly   visualized. No hemodynamically significant valvular heart disease   seen.  Recommendations:  Consider alternate imaging modality (MUGA, cardiac MRI) if assessement of ejection fraction will impact clinical decision making. _____________   History of Present Illness     62 y.o. male with PMH of HTN, HLD, OSA on CPAP, CKD  stage III, morbid obesity, and CAD s/p CABG.Patient underwent CABG in 2002 with LIMA to-LAD, SVG-RI, SVG-OM, SVG-PDA. Since then, he had underwent bare-metal stent to SVG-PDA and SVG-OM in 2011. 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 Patient had NSTEMIin September 2017, cardiac catheterization showed severe disease in the SVG to PDA treated with 2 drug-eluting stents. Echocardiogram obtained on 06/30/2016 showed EF 55 to 60%, grade 2 DD, no regional wall motion abnormality. He underwent PTCA for in-stent restenosis of the SVG to PDA in January 2019,he also hadDESto SVG-RI into diagonal branch.  Dr. Bernestine Amass saw the patient on 07/04/2018 for chest pain.  His symptoms at the time were concerning for angina, therefore we set the patient up for outpatient cardiac catheterization.  However blood work came back showing his creatinine has trended up from 1.72 up to 10.83. The patient was contacted and advised to go to the hospital.  Cardiac catheterization was canceled.  His acute kidney injury was felt to be post renal due to presence of kidney stone.  He underwent a cystoscopy with a right ureteral stent placement on 07/05/2018.  Postprocedure, his renal function significantly improved.  He went back for a retrograde pyelogram with cystoscopy and stent placement along with laser lithotripsy on 07/21/2018.  He presented back to the office for follow up with Dr. Ellyn Hack for reassessment of his symptoms.  Since the last visit, his  renal function had improved.  Last creatinine was 1.9.  He had an upcoming ureteral stent placement on October 30.  He had recovered well since the last urology procedure.  At that time, he continued to have exertional dyspnea and chest discomfort.  Given the similarity to the previous angina, it was recommended he have a cardiac catheterization.  He was set up for this as an outpatient. He  underwent cardiac cath noted above with no significant change from previous cath but elevated LVEDP. He was admitted post cath and started on IV lasix.   Hospital Course      1. Acute on chronic combined systolic/diastolic congestive heart failure-symptoms have improved.  I/O - 995.  Echocardiogram shows ejection fraction 45%.  Renal function is improved the day of discharge. Will plan to resume 40 mg daily the day after discharge. Patient instructed on low-sodium diet and fluid restriction to 1 L daily.    2. Coronary artery disease status post coronary artery bypass and graft-no chest pain.  Continue aspirin and Brilinta.  He is intolerant to statins.  3. Chronic stage III kidney disease-renal function improved this morning. Recent decline likely related to initiation of Lasix and contrast nephropathy.  4. Hypertension-blood pressure remained controlled.  Continue medications.  5. Hyperlipidemia-treated with repatha.  6. Hypokalemia-supplement  [name] was seen by Dr. Stanford Breed and determined stable for discharge home. Follow up in the office has been arranged. Medications are listed below. _____________  Discharge Vitals Blood pressure 111/76, pulse 66, temperature (!) 97.5 F (36.4 C), temperature source Oral, resp. rate 16, height 5\' 11"  (1.803 m), weight 126.2 kg, SpO2 97 %.  Filed Weights   08/15/18 1800 08/16/18 0613 08/17/18 0544  Weight: 125.9 kg 124.8 kg 126.2 kg    Labs & Radiologic Studies    CBC Recent Labs    08/14/18 1958  WBC 9.0  HGB 13.9  HCT 41.8  MCV 83.6  PLT 702   Basic Metabolic Panel Recent Labs    08/16/18 0500 08/17/18 0444  NA 135 134*  K 3.7 3.2*  CL 103 101  CO2 25 23  GLUCOSE 127* 144*  BUN 27* 24*  CREATININE 2.35* 2.09*  CALCIUM 9.0 8.9   Liver Function Tests No results for input(s): AST, ALT, ALKPHOS, BILITOT, PROT, ALBUMIN in the last 72 hours. No results for input(s): LIPASE, AMYLASE in the last 72  hours. Cardiac Enzymes No results for input(s): CKTOTAL, CKMB, CKMBINDEX, TROPONINI in the last 72 hours. BNP Invalid input(s): POCBNP D-Dimer No results for input(s): DDIMER in the last 72 hours. Hemoglobin A1C No results for input(s): HGBA1C in the last 72 hours. Fasting Lipid Panel No results for input(s): CHOL, HDL, LDLCALC, TRIG, CHOLHDL, LDLDIRECT in the last 72 hours. Thyroid Function Tests No results for input(s): TSH, T4TOTAL, T3FREE, THYROIDAB in the last 72 hours.  Invalid input(s): FREET3 _____________  Dg C-arm 1-60 Min-no Report  Result Date: 08/09/2018 Fluoroscopy was utilized by the requesting physician.  No radiographic interpretation.   Dg C-arm 1-60 Min-no Report  Result Date: 07/21/2018 Fluoroscopy was utilized by the requesting physician.  No radiographic interpretation.   Disposition   Pt is being discharged home today in good condition.  Follow-up Plans & Appointments    Follow-up Information    Lendon Colonel, NP Follow up on 08/23/2018.   Specialties:  Nurse Practitioner, Radiology, Cardiology Why:  at 10am for your follow up appt.  Contact information: [address] [place] [address]  [phone]          Discharge Instructions    Call MD for:  redness, tenderness, or signs of infection (pain, swelling, redness, odor or green/yellow discharge around incision site)   Complete by:  As directed    Diet - low sodium heart healthy   Complete by:  As directed    Discharge instructions   Complete by:  As directed    Radial Site Care Refer to this sheet in the next few weeks. These instructions provide you with information on caring for yourself after your procedure. Your caregiver may also give you more specific instructions. Your treatment has been planned according to current medical practices, but problems sometimes occur. Call your caregiver if you have any problems or questions after your procedure. HOME CARE  INSTRUCTIONS You may shower the day after the procedure.Remove the bandage (dressing) and gently wash the site with plain soap and water.Gently pat the site dry.  Do not apply powder or lotion to the site.  Do not submerge the affected site in water for 3 to 5 days.  Inspect the site at least twice daily.  Do not flex or bend the affected arm for 24 hours.  No lifting over 5 pounds (2.3 kg) for 5 days after your procedure.  Do not drive home if you are discharged the same day of the procedure. Have someone else drive you.  You may drive 24 hours after the procedure unless otherwise instructed by your caregiver.  What to expect: Any bruising will usually fade within 1 to 2 weeks.  Blood that collects in the tissue (hematoma) may be painful to the touch. It should usually decrease in size and tenderness within 1 to 2 weeks.  SEEK IMMEDIATE MEDICAL CARE IF: You have unusual pain at the radial site.  You have redness, warmth, swelling, or pain at the radial site.  You have drainage (other than a small amount of blood on the dressing).  You have chills.  You have a fever or persistent symptoms for more than 72 hours.  You have a fever and your symptoms suddenly get worse.  Your arm becomes pale, cool, tingly, or numb.  You have heavy bleeding from the site. Hold pressure on the site.   Increase activity slowly   Complete by:  As directed       Discharge Medications     Medication List    STOP taking these medications   cephALEXin 500 MG capsule Commonly known as:  KEFLEX     TAKE these medications   aspirin 81 MG EC tablet Take 1 tablet (81 mg total) by mouth daily. Start taking on:  08/18/2018   BRILINTA 90 MG Tabs tablet Generic drug:  ticagrelor TAKE 1 TABLET BY MOUTH TWICE A DAY What changed:  how much to take   buPROPion 150 MG 12 hr tablet Commonly known as:  WELLBUTRIN SR Take 150 mg by mouth 2 (two) times daily.   clonazePAM 2 MG tablet Commonly known as:   KLONOPIN Take 1 tablet (2 mg total) by mouth at bedtime.   cyclobenzaprine 10 MG tablet Commonly known as:  FLEXERIL Take 10 mg by mouth 3 (three) times daily as needed for muscle spasms.   Evolocumab 140 MG/ML Soaj Inject 140 mg into the skin every 14 (fourteen) days.   furosemide 40 MG tablet Commonly known as:  LASIX Take 1 tablet (40 mg total) by mouth daily.   isosorbide mononitrate 30 MG 24 hr tablet  Commonly known as:  IMDUR Take 30 mg by mouth daily.   nitroGLYCERIN 0.4 MG SL tablet Commonly known as:  NITROSTAT PLACE 1TAB UNDER TONGUE EVERY 5MIN FOR 3DOSES AS NEEDED FOR CHEST PAIN   oxyCODONE-acetaminophen 5-325 MG tablet Commonly known as:  PERCOCET/ROXICET Take 1-2 tablets by mouth every 6 (six) hours as needed for moderate pain or severe pain. Post-operatively   propranolol 20 MG tablet Commonly known as:  INDERAL Take 1 tablet (20 mg total) by mouth 3 (three) times daily.        Acute coronary syndrome (MI, NSTEMI, STEMI, etc) this admission?: No.   Outstanding Labs/Studies   BMET at follow up appt.   Duration of Discharge Encounter   Greater than 30 minutes including physician time.  Signed, Baer Hinton NP-C 08/17/2018, 11:44 AM

## 2018-08-17 NOTE — Care Management Important Message (Signed)
Important Message  Patient Details  Name: [name] MRN: [id number] Date of Birth: 18-Nov-1955   Medicare Important Message Given:  Yes    [name] 08/17/2018, 4:01 PM

## 2018-08-17 NOTE — Plan of Care (Signed)
  Problem: Education: Goal: Knowledge of General Education information will improve Description Including pain rating scale, medication(s)/side effects and non-pharmacologic comfort measures Outcome: Progressing   Problem: Health Behavior/Discharge Planning: Goal: Ability to safely manage health-related needs after discharge will improve Outcome: Progressing

## 2018-08-21 ENCOUNTER — Telehealth: Payer: Self-pay | Admitting: Cardiology

## 2018-08-21 NOTE — Telephone Encounter (Signed)
New message   Pt c/o medication issue:  1. Name of Medication: furosemide (LASIX) 40 MG tablet  2. How are you currently taking this medication (dosage and times per day)? 1 time fin morning  3. Are you having a reaction (difficulty breathing--STAT)? no  4. What is your medication issue? Patient states that he had cath surgery and he states that your having dizziness and sob. Patient wants to review the lasix to see if it may need to be increased. Please advise.

## 2018-08-21 NOTE — Telephone Encounter (Signed)
Returned call to patient he stated he a cardiac cath last week.Stated he has lost 9 lbs since discharged last Thursday.Stated sob is worse.He gets dizzy when he gets up after walking 8 to 10 feet.Spoke to [place] he advised to hold propranolol today, schedule appointment tomorrow afternoon with extender.Appointment scheduled with Jory Sims DNP 08/22/18 at 2:00 pm.Advised to bring all medications.Advised to go to ED if needed.

## 2018-08-22 ENCOUNTER — Encounter (HOSPITAL_COMMUNITY): Payer: Self-pay | Admitting: Emergency Medicine

## 2018-08-22 ENCOUNTER — Emergency Department (HOSPITAL_COMMUNITY): Payer: Medicare Other

## 2018-08-22 ENCOUNTER — Emergency Department (HOSPITAL_COMMUNITY)
Admission: EM | Admit: 2018-08-22 | Discharge: 2018-08-23 | Disposition: A | Discharge status: Home / Self Care | Payer: Medicare Other | Attending: Emergency Medicine | Admitting: Emergency Medicine

## 2018-08-22 ENCOUNTER — Encounter: Payer: Self-pay | Admitting: Adult Health

## 2018-08-22 ENCOUNTER — Other Ambulatory Visit: Payer: Self-pay

## 2018-08-22 ENCOUNTER — Ambulatory Visit: Payer: Medicare Other | Admitting: Adult Health

## 2018-08-22 VITALS — BP 130/80 | HR 93 | Ht 71.0 in | Wt 279.4 lb

## 2018-08-22 DIAGNOSIS — I5040 Unspecified combined systolic (congestive) and diastolic (congestive) heart failure: Secondary | ICD-10-CM | POA: Insufficient documentation

## 2018-08-22 DIAGNOSIS — F329 Major depressive disorder, single episode, unspecified: Secondary | ICD-10-CM | POA: Insufficient documentation

## 2018-08-22 DIAGNOSIS — Z950 Presence of cardiac pacemaker: Secondary | ICD-10-CM | POA: Insufficient documentation

## 2018-08-22 DIAGNOSIS — Z9889 Other specified postprocedural states: Secondary | ICD-10-CM

## 2018-08-22 DIAGNOSIS — I5032 Chronic diastolic (congestive) heart failure: Secondary | ICD-10-CM

## 2018-08-22 DIAGNOSIS — I2510 Atherosclerotic heart disease of native coronary artery without angina pectoris: Secondary | ICD-10-CM | POA: Insufficient documentation

## 2018-08-22 DIAGNOSIS — Z79899 Other long term (current) drug therapy: Secondary | ICD-10-CM

## 2018-08-22 DIAGNOSIS — N183 Chronic kidney disease, stage 3 (moderate): Secondary | ICD-10-CM | POA: Insufficient documentation

## 2018-08-22 DIAGNOSIS — I130 Hypertensive heart and chronic kidney disease with heart failure and stage 1 through stage 4 chronic kidney disease, or unspecified chronic kidney disease: Secondary | ICD-10-CM | POA: Diagnosis not present

## 2018-08-22 DIAGNOSIS — I10 Essential (primary) hypertension: Secondary | ICD-10-CM

## 2018-08-22 DIAGNOSIS — Z9861 Coronary angioplasty status: Secondary | ICD-10-CM

## 2018-08-22 DIAGNOSIS — Z7982 Long term (current) use of aspirin: Secondary | ICD-10-CM | POA: Insufficient documentation

## 2018-08-22 DIAGNOSIS — R0602 Shortness of breath: Secondary | ICD-10-CM | POA: Insufficient documentation

## 2018-08-22 DIAGNOSIS — I252 Old myocardial infarction: Secondary | ICD-10-CM | POA: Diagnosis not present

## 2018-08-22 DIAGNOSIS — Z955 Presence of coronary angioplasty implant and graft: Secondary | ICD-10-CM | POA: Insufficient documentation

## 2018-08-22 DIAGNOSIS — R0609 Other forms of dyspnea: Secondary | ICD-10-CM

## 2018-08-22 LAB — COMPREHENSIVE METABOLIC PANEL
ALBUMIN: 4.6 g/dL (ref 3.5–5.0)
ALT: 25 U/L (ref 0–44)
AST: 36 U/L (ref 15–41)
Alkaline Phosphatase: 60 U/L (ref 38–126)
Anion gap: 14 (ref 5–15)
BILIRUBIN TOTAL: 1.2 mg/dL (ref 0.3–1.2)
BUN: 34 mg/dL — AB (ref 8–23)
CALCIUM: 9.5 mg/dL (ref 8.9–10.3)
CO2: 25 mmol/L (ref 22–32)
CREATININE: 2.38 mg/dL — AB (ref 0.61–1.24)
Chloride: 97 mmol/L — ABNORMAL LOW (ref 98–111)
GFR calc Af Amer: 32 mL/min — ABNORMAL LOW (ref >60)
GFR, EST NON AFRICAN AMERICAN: 28 mL/min — AB (ref >60)
GLUCOSE: 122 mg/dL — AB (ref 70–99)
POTASSIUM: 3.2 mmol/L — AB (ref 3.5–5.1)
Sodium: 136 mmol/L (ref 135–145)
TOTAL PROTEIN: 8.1 g/dL (ref 6.5–8.1)

## 2018-08-22 LAB — CBC WITH DIFFERENTIAL/PLATELET
ABS IMMATURE GRANULOCYTES: 0.1 10*3/uL — AB (ref 0.00–0.07)
BASOS PCT: 1 %
Basophils Absolute: 0.1 10*3/uL (ref 0.0–0.1)
EOS ABS: 0.3 10*3/uL (ref 0.0–0.5)
Eosinophils Relative: 3 %
HCT: 42.7 % (ref 39.0–52.0)
Hemoglobin: 14.4 g/dL (ref 13.0–17.0)
Immature Granulocytes: 1 %
Lymphocytes Relative: 29 %
Lymphs Abs: 3 10*3/uL (ref 0.7–4.0)
MCH: 28.5 pg (ref 26.0–34.0)
MCHC: 33.7 g/dL (ref 30.0–36.0)
MCV: 84.6 fL (ref 80.0–100.0)
MONO ABS: 1 10*3/uL (ref 0.1–1.0)
MONOS PCT: 10 %
NEUTROS ABS: 5.7 10*3/uL (ref 1.7–7.7)
Neutrophils Relative %: 56 %
PLATELETS: 341 10*3/uL (ref 150–400)
RBC: 5.05 MIL/uL (ref 4.22–5.81)
RDW: 14.1 % (ref 11.5–15.5)
WBC: 10.2 10*3/uL (ref 4.0–10.5)
nRBC: 0 % (ref 0.0–0.2)

## 2018-08-22 LAB — I-STAT TROPONIN, ED
TROPONIN I, POC: 0.03 ng/mL (ref 0.00–0.08)
Troponin i, poc: 0.02 ng/mL (ref 0.00–0.08)

## 2018-08-22 LAB — BRAIN NATRIURETIC PEPTIDE: B NATRIURETIC PEPTIDE 5: 25.3 pg/mL (ref 0.0–100.0)
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
## 2018-08-22 NOTE — Patient Instructions (Signed)
Medication Instructions:  TAKE LASIX 40MG  WHEN YOU GET HOME(WITH 2-POTASSIUM)  THEN 20MG  (WITH 1-20MeQ POTASSIUM)THIS EVENING  THEN TOMORROW TAKE LASIX 20MG  AND 20MeQ POTASSIUM TWICE DAILY THERE AFTER. If you need a refill on your cardiac medications before your next appointment, please call your pharmacy.  Labwork: BMET TODAY AND FRIDAY HERE IN OUR OFFICE AT [facility]  Take the provided lab slips with you to the lab for your blood draw.   You will NOT need to fast   If you have labs (blood work) drawn today and your tests are completely normal, you will receive your results only by: Marland Kitchen MyChart Message (if you have MyChart) OR . A paper copy in the mail If you have any lab test that is abnormal or we need to change your treatment, we will call you to review the results.  Testing/Procedures: VQ SCAN AT [facility] TODAY WHEN YOU LEAVE HERE  Follow-Up: You will need a follow up appointment in ON Friday @130PM  WITH HAO MENG PA-C.    At [facility], you and your health needs are our priority.  As part of our continuing mission to provide you with exceptional heart care, we have created designated Provider Care Teams.  These Care Teams include your primary Cardiologist (physician) and Advanced Practice Providers (APPs -  Physician Assistants and Nurse Practitioners) who all work together to provide you with the care you need, when you need it.  Thank you for choosing [facility] at [facility]

## 2018-08-22 NOTE — ED Notes (Signed)
Nuclear Med will be here to do the scan in about an hour and a half
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
## 2018-08-22 NOTE — ED Notes (Signed)
Pt out of department for V/Q scan

## 2018-08-22 NOTE — ED Triage Notes (Signed)
Patient reports he was sent from PCP for VQ scan. Reports intermittent SOB and chest pressure x1 month. Hx CHF.

## 2018-08-23 ENCOUNTER — Telehealth: Payer: Self-pay | Admitting: Adult Health

## 2018-08-23 ENCOUNTER — Ambulatory Visit: Payer: Medicare Other | Admitting: Adult Health

## 2018-08-23 LAB — BASIC METABOLIC PANEL
BUN / CREAT RATIO: 14 (ref 10–24)
BUN: 32 mg/dL — ABNORMAL HIGH (ref 8–27)
CHLORIDE: 93 mmol/L — AB (ref 96–106)
CO2: 24 mmol/L (ref 20–29)
CREATININE: 2.28 mg/dL — AB (ref 0.76–1.27)
Calcium: 9.9 mg/dL (ref 8.6–10.2)
GFR calc Af Amer: 34 mL/min/{1.73_m2} — ABNORMAL LOW (ref >59)
GFR, EST NON AFRICAN AMERICAN: 30 mL/min/{1.73_m2} — AB (ref >59)
Glucose: 153 mg/dL — ABNORMAL HIGH (ref 65–99)
POTASSIUM: 3.8 mmol/L (ref 3.5–5.2)
SODIUM: 138 mmol/L (ref 134–144)

## 2018-08-23 MED ORDER — TECHNETIUM TC 99M DIETHYLENETRIAME-PENTAACETIC ACID
32.4000 | Freq: Once | INTRAVENOUS | Status: AC | PRN
  Administered 2018-08-22: 32.4 via RESPIRATORY_TRACT

## 2018-08-23 MED ORDER — TECHNETIUM TO 99M ALBUMIN AGGREGATED
4.2800 | Freq: Once | INTRAVENOUS | Status: AC | PRN
  Administered 2018-08-23: 4.28 via INTRAVENOUS

## 2018-08-23 MED ORDER — POTASSIUM CHLORIDE CRYS ER 20 MEQ PO TBCR: EXTENDED_RELEASE_TABLET | ORAL | 6 refills | Status: DC

## 2018-08-23 NOTE — ED Provider Notes (Signed)
Assumed care from [place] at shift change.  See prior note for full H&P.  Briefly, 62 year old male here with intermittent shortness of breath for the past month.  Does have some chest pressure.  Had cardiac cath last month but did not show any acute abnormalities.  Labs thus far reassuring, troponin negative.  CXR clear.    Plan:  VQ scan and delta trop pending.  If negative, patient can be discharged home.  Results for orders placed or performed during the hospital encounter of 08/22/18  Comprehensive metabolic panel  Result Value Ref Range   Sodium 136 135 - 145 mmol/L   Potassium 3.2 (L) 3.5 - 5.1 mmol/L   Chloride 97 (L) 98 - 111 mmol/L   CO2 25 22 - 32 mmol/L   Glucose, Bld 122 (H) 70 - 99 mg/dL   BUN 34 (H) 8 - 23 mg/dL   Creatinine, Ser 2.38 (H) 0.61 - 1.24 mg/dL   Calcium 9.5 8.9 - 10.3 mg/dL   Total Protein 8.1 6.5 - 8.1 g/dL   Albumin 4.6 3.5 - 5.0 g/dL   AST 36 15 - 41 U/L   ALT 25 0 - 44 U/L   Alkaline Phosphatase 60 38 - 126 U/L   Total Bilirubin 1.2 0.3 - 1.2 mg/dL   GFR calc non Af Amer 28 (L) >60 mL/min   GFR calc Af Amer 32 (L) >60 mL/min   Anion gap 14 5 - 15  CBC with Differential  Result Value Ref Range   WBC 10.2 4.0 - 10.5 K/uL   RBC 5.05 4.22 - 5.81 MIL/uL   Hemoglobin 14.4 13.0 - 17.0 g/dL   HCT 42.7 39.0 - 52.0 %   MCV 84.6 80.0 - 100.0 fL   MCH 28.5 26.0 - 34.0 pg   MCHC 33.7 30.0 - 36.0 g/dL   RDW 14.1 11.5 - 15.5 %   Platelets 341 150 - 400 K/uL   nRBC 0.0 0.0 - 0.2 %   Neutrophils Relative % 56 %   Neutro Abs 5.7 1.7 - 7.7 K/uL   Lymphocytes Relative 29 %   Lymphs Abs 3.0 0.7 - 4.0 K/uL   Monocytes Relative 10 %   Monocytes Absolute 1.0 0.1 - 1.0 K/uL   Eosinophils Relative 3 %   Eosinophils Absolute 0.3 0.0 - 0.5 K/uL   Basophils Relative 1 %   Basophils Absolute 0.1 0.0 - 0.1 K/uL   Immature Granulocytes 1 %   Abs Immature Granulocytes 0.10 (H) 0.00 - 0.07 K/uL  Brain natriuretic peptide  Result Value Ref Range   B Natriuretic Peptide  25.3 0.0 - 100.0 pg/mL  I-stat troponin, ED  Result Value Ref Range   Troponin i, poc 0.02 0.00 - 0.08 ng/mL   Comment 3          I-stat troponin, ED  Result Value Ref Range   Troponin i, poc 0.03 0.00 - 0.08 ng/mL   Comment 3           Dg Chest 2 View  Result Date: 08/22/2018 CLINICAL DATA:  Short of breath for 1 month EXAM: CHEST - 2 VIEW COMPARISON:  None. FINDINGS: Normal heart size. Lungs are under aerated with bibasilar atelectasis. Postoperative changes from sternotomy and CABG. No pneumothorax or pleural effusion. IMPRESSION: Low volumes and bibasilar atelectasis. Electronically Signed   By: Marybelle Killings M.D.   On: 08/22/2018 18:35   Nm Pulmonary Vent And Perf (v/q Scan)  Result Date: 08/23/2018 CLINICAL DATA:  Chest  pressure and shortness of breath EXAM: NUCLEAR MEDICINE VENTILATION - PERFUSION LUNG SCAN TECHNIQUE: Ventilation images were obtained in multiple projections using inhaled aerosol Tc-39m DTPA. Perfusion images were obtained in multiple projections after intravenous injection of Tc-38m MAA. RADIOPHARMACEUTICALS:  32.4 mCi of Tc-80m DTPA aerosol inhalation and 4.28 mCi Tc60m MAA IV COMPARISON:  None. FINDINGS: Ventilation: Multiple small matched defects on the left. Perfusion: Multiple small matched defects on the left. IMPRESSION: Multiple small matched defects on the left. No unmatched defects noted. Very low probability V/Q scan. Electronically Signed   By: Dorise Bullion III M.D   On: 08/23/2018 00:55   Dg C-arm 1-60 Min-no Report  Result Date: 08/09/2018 Fluoroscopy was utilized by the requesting physician.  No radiographic interpretation.   Delta trop and VQ scan negative. Patient remains hemodynamically stable here.  He is not in any acute respiratory distress, denies chest pain on my evaluation.  Feel he stable for discharge home.  Patient is comfortable with this.  He has previously scheduled follow-up with his primary care doctor on Friday.  I have given him  copies of his test results to share with his physician.  He will return here for any new or worsening symptoms.   Larene Pickett, PA-C 08/23/18 0158    Orpah Greek, MD 08/23/18 [phone]

## 2018-08-23 NOTE — Telephone Encounter (Signed)
New Message:     Pt saw Jory Sims yesterday. He said she was supposed to call some medicine in for his Potassium yesterday, Please call this to [facility] [phone].

## 2018-08-23 NOTE — Telephone Encounter (Signed)
Returned call to patient he stated potassium prescription was never sent to pharmacy.After reviewing chart Jory Sims DNP advised to take Kdur 20 meq twice a day with Lasix.Advised to take Kdur 2 tablets ( 40 meq ) tonight and 2 tablets ( 40 meq ) in morning then take 1 tablet ( 20 meq ) twice a day. Prescription sent to pharmacy.Advised to keep appointment with Almyra Deforest PA 08/25/18 at 1:30 pm.

## 2018-08-23 NOTE — Discharge Instructions (Signed)
All your tests today looked great. Follow-up with your doctor on Friday as scheduled-- have them review your results. Return here for any new/acute changes.

## 2018-08-23 NOTE — ED Notes (Signed)
Pt returned from scan.

## 2018-08-25 ENCOUNTER — Ambulatory Visit: Payer: Medicare Other | Admitting: Physician Assistant

## 2018-08-25 ENCOUNTER — Encounter: Payer: Self-pay | Admitting: Physician Assistant

## 2018-08-25 VITALS — BP 132/87 | HR 73 | Resp 16 | Ht 71.0 in | Wt 277.2 lb

## 2018-08-25 DIAGNOSIS — N200 Calculus of kidney: Secondary | ICD-10-CM

## 2018-08-25 DIAGNOSIS — I10 Essential (primary) hypertension: Secondary | ICD-10-CM

## 2018-08-25 DIAGNOSIS — I25810 Atherosclerosis of coronary artery bypass graft(s) without angina pectoris: Secondary | ICD-10-CM

## 2018-08-25 DIAGNOSIS — E785 Hyperlipidemia, unspecified: Secondary | ICD-10-CM

## 2018-08-25 DIAGNOSIS — G4733 Obstructive sleep apnea (adult) (pediatric): Secondary | ICD-10-CM

## 2018-08-25 DIAGNOSIS — N183 Chronic kidney disease, stage 3 unspecified: Secondary | ICD-10-CM

## 2018-08-25 DIAGNOSIS — Z9989 Dependence on other enabling machines and devices: Secondary | ICD-10-CM

## 2018-08-25 DIAGNOSIS — I5042 Chronic combined systolic (congestive) and diastolic (congestive) heart failure: Secondary | ICD-10-CM

## 2018-08-25 MED ORDER — FUROSEMIDE 40 MG PO TABS: ORAL_TABLET | ORAL | 3 refills | Status: DC

## 2018-08-25 NOTE — Patient Instructions (Addendum)
  Medication Instructions:  NONE ORDERED  TODAY  If you need a refill on your cardiac medications before your next appointment, please call your pharmacy.   Lab work:  BMET MONDAY  If you have labs (blood work) drawn today and your tests are completely normal, you will receive your results only by: Marland Kitchen MyChart Message (if you have MyChart) OR . A paper copy in the mail If you have any lab test that is abnormal or we need to change your treatment, we will call you to review the results.  Testing/Procedures:  NONE ORDERED  TODAY   Follow-Up:  AS SCHEDULED    Any Other Special Instructions Will Be Listed Below (If Applicable).  MAKE SURE YOUR GETTING  DAILY FLUID  32-64 OZ    MAKE SURE YOUR GETTING  500 MG OF SODIUM  PER MEAL
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
## 2018-08-27 ENCOUNTER — Encounter: Payer: Self-pay | Admitting: Physician Assistant

## 2018-08-29 ENCOUNTER — Encounter (HOSPITAL_COMMUNITY): Payer: Self-pay | Admitting: Emergency Medicine

## 2018-08-29 ENCOUNTER — Other Ambulatory Visit: Payer: Self-pay

## 2018-08-29 ENCOUNTER — Emergency Department (HOSPITAL_COMMUNITY)
Admission: EM | Admit: 2018-08-29 | Discharge: 2018-08-29 | Disposition: A | Discharge status: Home / Self Care | Payer: Medicare Other | Attending: Emergency Medicine | Admitting: Emergency Medicine

## 2018-08-29 DIAGNOSIS — I130 Hypertensive heart and chronic kidney disease with heart failure and stage 1 through stage 4 chronic kidney disease, or unspecified chronic kidney disease: Secondary | ICD-10-CM | POA: Insufficient documentation

## 2018-08-29 DIAGNOSIS — I2510 Atherosclerotic heart disease of native coronary artery without angina pectoris: Secondary | ICD-10-CM | POA: Diagnosis not present

## 2018-08-29 DIAGNOSIS — Z950 Presence of cardiac pacemaker: Secondary | ICD-10-CM | POA: Insufficient documentation

## 2018-08-29 DIAGNOSIS — N183 Chronic kidney disease, stage 3 (moderate): Secondary | ICD-10-CM | POA: Diagnosis not present

## 2018-08-29 DIAGNOSIS — I5042 Chronic combined systolic (congestive) and diastolic (congestive) heart failure: Secondary | ICD-10-CM | POA: Diagnosis not present

## 2018-08-29 DIAGNOSIS — N179 Acute kidney failure, unspecified: Secondary | ICD-10-CM

## 2018-08-29 DIAGNOSIS — Z79899 Other long term (current) drug therapy: Secondary | ICD-10-CM | POA: Diagnosis not present

## 2018-08-29 DIAGNOSIS — Z7982 Long term (current) use of aspirin: Secondary | ICD-10-CM | POA: Insufficient documentation

## 2018-08-29 DIAGNOSIS — R339 Retention of urine, unspecified: Secondary | ICD-10-CM | POA: Insufficient documentation

## 2018-08-29 LAB — URINALYSIS, ROUTINE W REFLEX MICROSCOPIC
Bilirubin Urine: NEGATIVE
Glucose, UA: NEGATIVE mg/dL
KETONES UR: NEGATIVE mg/dL
Leukocytes, UA: NEGATIVE
Nitrite: NEGATIVE
PH: 6 (ref 5.0–8.0)
Protein, ur: 30 mg/dL — AB
SPECIFIC GRAVITY, URINE: 1.012 (ref 1.005–1.030)

## 2018-08-29 LAB — BASIC METABOLIC PANEL
BUN / CREAT RATIO: 14 (ref 10–24)
BUN: 34 mg/dL — ABNORMAL HIGH (ref 8–27)
CHLORIDE: 92 mmol/L — AB (ref 96–106)
CO2: 22 mmol/L (ref 20–29)
Calcium: 10 mg/dL (ref 8.6–10.2)
Creatinine, Ser: 2.38 mg/dL — ABNORMAL HIGH (ref 0.76–1.27)
GFR calc non Af Amer: 28 mL/min/{1.73_m2} — ABNORMAL LOW (ref >59)
GFR, EST AFRICAN AMERICAN: 33 mL/min/{1.73_m2} — AB (ref >59)
Glucose: 194 mg/dL — ABNORMAL HIGH (ref 65–99)
POTASSIUM: 3.7 mmol/L (ref 3.5–5.2)
Sodium: 136 mmol/L (ref 134–144)

## 2018-08-29 LAB — I-STAT CHEM 8, ED
BUN: 37 mg/dL — ABNORMAL HIGH (ref 8–23)
CREATININE: 2.6 mg/dL — AB (ref 0.61–1.24)
Calcium, Ion: 1.16 mmol/L (ref 1.15–1.40)
Chloride: 97 mmol/L — ABNORMAL LOW (ref 98–111)
Glucose, Bld: 160 mg/dL — ABNORMAL HIGH (ref 70–99)
HEMATOCRIT: 37 % — AB (ref 39.0–52.0)
HEMOGLOBIN: 12.6 g/dL — AB (ref 13.0–17.0)
POTASSIUM: 3.1 mmol/L — AB (ref 3.5–5.1)
Sodium: 134 mmol/L — ABNORMAL LOW (ref 135–145)
TCO2: 27 mmol/L (ref 22–32)

## 2018-08-29 MED ORDER — LIDOCAINE HCL URETHRAL/MUCOSAL 2 % EX GEL
1.0000 "application " | Freq: Once | CUTANEOUS | Status: AC | PRN
  Administered 2018-08-29: 1 via URETHRAL
  Filled 2018-08-29: qty 30

## 2018-08-29 MED ORDER — FUROSEMIDE 40 MG PO TABS: 60.0000 mg | ORAL_TABLET | Freq: Every day | ORAL | 3 refills | Status: DC

## 2018-08-29 NOTE — ED Triage Notes (Signed)
Pt reports not being able to urinate since having urinary stent removed on 08/28/18. Bladder scan shows 718ml of urine.

## 2018-08-29 NOTE — ED Notes (Signed)
Bed: [facility]1 Expected date:  Expected time:  Means of arrival:  Comments: 
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
## 2018-08-29 NOTE — Discharge Instructions (Signed)
Leave foley in place for now. Follow-up with Dr. Tresa Moore in clinic-- call in the morning to discuss this ED visit and schedule appt. Return here for any new/acute changes.

## 2018-08-30 ENCOUNTER — Telehealth: Payer: Self-pay | Admitting: Cardiology

## 2018-08-30 LAB — URINE CULTURE: CULTURE: NO GROWTH

## 2018-08-30 NOTE — Telephone Encounter (Signed)
Received records from [facility] on 08/30/18, Appt 09/19/18 @ 4:20PM.NV

## 2018-08-31 ENCOUNTER — Telehealth: Payer: Self-pay | Admitting: Cardiology

## 2018-08-31 NOTE — Telephone Encounter (Signed)
Received records from [facility] on 08/31/18, Appt 09/19/18 @ 4:20PM.NV

## 2018-09-04 ENCOUNTER — Telehealth: Payer: Self-pay | Admitting: Cardiology

## 2018-09-04 NOTE — Telephone Encounter (Signed)
Received records from [facility] On 09/04/18, Appt 09/19/18 @ 4:20PM.NV

## 2018-09-19 ENCOUNTER — Encounter: Payer: Self-pay | Admitting: Cardiology

## 2018-09-19 ENCOUNTER — Ambulatory Visit: Payer: Medicare Other | Admitting: Cardiology

## 2018-09-19 VITALS — BP 110/80 | HR 84 | Ht 71.0 in | Wt 280.0 lb

## 2018-09-19 DIAGNOSIS — I2510 Atherosclerotic heart disease of native coronary artery without angina pectoris: Secondary | ICD-10-CM

## 2018-09-19 DIAGNOSIS — E785 Hyperlipidemia, unspecified: Secondary | ICD-10-CM

## 2018-09-19 DIAGNOSIS — R0609 Other forms of dyspnea: Secondary | ICD-10-CM

## 2018-09-19 DIAGNOSIS — I5042 Chronic combined systolic (congestive) and diastolic (congestive) heart failure: Secondary | ICD-10-CM

## 2018-09-19 DIAGNOSIS — I10 Essential (primary) hypertension: Secondary | ICD-10-CM

## 2018-09-19 DIAGNOSIS — I25700 Atherosclerosis of coronary artery bypass graft(s), unspecified, with unstable angina pectoris: Secondary | ICD-10-CM | POA: Diagnosis not present

## 2018-09-19 DIAGNOSIS — Z9861 Coronary angioplasty status: Secondary | ICD-10-CM

## 2018-09-19 MED ORDER — PRASUGREL HCL 10 MG PO TABS: 10.0000 mg | ORAL_TABLET | Freq: Every day | ORAL | 3 refills | Status: DC

## 2018-09-19 MED ORDER — FUROSEMIDE 40 MG PO TABS: ORAL_TABLET | ORAL | 3 refills | Status: DC

## 2018-09-19 MED ORDER — RANOLAZINE ER 500 MG PO TB12: 500.0000 mg | ORAL_TABLET | Freq: Two times a day (BID) | ORAL | 3 refills | Status: AC

## 2018-09-19 NOTE — Patient Instructions (Signed)
Medication Instructions:  START RANEXA 500 MG TWICE A DAY  STOP IMDUR  START EFFIENT 10 MG - THE FIRST DAY TAKE 3 TABLETS THEN THEREAFTER TAKE 10 MG TABLET DAILY.   STOP BRILLINTA  LASIX 40 MG DAILY , MAY TAKE AN EXTRA 40 MG DAILY IF WEIGHT IS GREATER THAN 3 LBS OVER NIGHT.  If you need a refill on your cardiac medications before your next appointment, please call your pharmacy.   Lab work: NO LABS If you have labs (blood work) drawn today and your tests are completely normal, you will receive your results only by: Marland Kitchen MyChart Message (if you have MyChart) OR . A paper copy in the mail If you have any lab test that is abnormal or we need to change your treatment, we will call you to review the results.  Testing/Procedures: SCHEDULE AT [place] TEST Your physician has recommended that you have a pulmonary function test. Pulmonary Function Tests are a group of tests that measure how well air moves in and out of your lungs. AND Your physician has recommended that you have a cardiopulmonary stress test (CPX). CPX testing is a non-invasive measurement of heart and lung function. It replaces a traditional treadmill stress test. This type of test provides a tremendous amount of information that relates not only to your present condition but also for future outcomes. This test combines measurements of you ventilation, respiratory gas exchange in the lungs, electrocardiogram (EKG), blood pressure and physical response before, during, and following an exercise protocol.     Follow-Up: At [facility], you and your health needs are our priority.  As part of our continuing mission to provide you with exceptional heart care, we have created designated Provider Care Teams.  These Care Teams include your primary Cardiologist (physician) and Advanced Practice Providers (APPs -  Physician Assistants and Nurse Practitioners) who all work together to provide you with the care you need, when  you need it. . Your physician recommends that you schedule a follow-up appointment in FEB 12,2020 AT 4 PM WITH DR HARDING. .   Any Other Special Instructions Will Be Listed Below (If Applicable). *

## 2018-09-19 NOTE — Progress Notes (Signed)
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 have a cardiopulmonary stress test (CPX). CPX testing is a non-invasive measurement of heart and lung function. It replaces a traditional treadmill stress test. This type of test provides a tremendous amount of information that relates not only to your present condition but also for future outcomes. This test combines measurements of you ventilation, respiratory gas exchange in the lungs, electrocardiogram (EKG), blood pressure and physical response before, during, and following an exercise protocol.     Follow-Up: At [facility], you and your health needs are our priority.  As part of our continuing mission to provide you with exceptional heart care, we have created designated Provider Care Teams.  These Care Teams include your primary Cardiologist (physician) and Advanced Practice Providers (APPs -  Physician Assistants and Nurse Practitioners) who all work together to provide you with the care you need, when you need it. . Your physician recommends that you schedule a  follow-up appointment in FEB 12,2020 AT 4 PM WITH DR Chastin Garlitz. .   Any Other Special Instructions Will Be Listed Below (If Applicable). *   Studies Ordered:   Orders Placed This Encounter  Procedures  . Cardiopulmonary exercise test  . Pulmonary Function Test      [name], M.D., M.S. Interventional Cardiologist   Pager # [phone] Phone # [phone] [address]. [place], [place] [address]   Thank you for choosing Heart[facility] at [facility]!!

## 2018-09-20 ENCOUNTER — Encounter: Payer: Self-pay | Admitting: Cardiology

## 2018-09-20 ENCOUNTER — Telehealth: Payer: Self-pay | Admitting: *Deleted

## 2018-09-20 NOTE — Telephone Encounter (Signed)
Left message for patient to cal regarding PFT and CPX appointments

## 2018-09-20 NOTE — Assessment & Plan Note (Signed)
Need to get back on his diet and start exercising.  I want to figure out why short of breath so he can get back to doing some exercise.

## 2018-09-20 NOTE — Telephone Encounter (Signed)
Patient called regarding appointments---gave the patient dates and times--will also mail information to him.

## 2018-09-20 NOTE — Assessment & Plan Note (Signed)
Interestingly, ever since his renal issues, he has had relatively low blood pressures.  No longer on ARB or amlodipine, and had been started on chlorthalidone which is long since been stopped.  Now is only on Inderal plus Lasix. If any blood pressure remains a little bit low.

## 2018-09-20 NOTE — Assessment & Plan Note (Signed)
Interestingly, his right heart cath numbers were unusual.  Wedge pressure was not overly high but is EDP was.  I suspect at the time he was still hypertensive but not hypotensive anymore. Is only on Inderal.  Not on afterload reduction agent with his blood pressure being as low as it is.  He on same dose of Lasix but we talked about sliding scale dosing for additional weight gain.  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  Evaluating with PFTs and CPX to distinguish between cardiac and pulmonary disease versus obesity.

## 2018-09-20 NOTE — Assessment & Plan Note (Signed)
Back on Repatha.  Needs diet and exercise changes well.  LDL was down to 100 as of September.  Unfortunately he has been intermittently off of Repatha. Will be due for lipid check by time I see him back.

## 2018-09-20 NOTE — Assessment & Plan Note (Signed)
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  3 of 4 grafts patent with stents in the SVG-PDA and RI into the native RI. Currently on aspirin plus Brilinta, converting to Effient and then stopping aspirin in January. Is on Inderal for beta-blocker.  No longer on ARB because of low blood pressures and renal insufficiency. 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  He is noticing significant headache with Imdur and no improvement of symptoms.  Now on Repatha because of statin intolerance.

## 2018-09-20 NOTE — Assessment & Plan Note (Signed)
He had had a pretty significant response to his last PCI in January, but now his dyspnea seems different.  Angiography revealed stable disease besides may be some proximal disease. He did have reduced EF which I wonder if it could have been related to stress-induced cardiomyopathy in the setting of his nephrolithiasis and with ureteral obstruction.  The wall motion normality suggested could very well have been related to that since there was no lesion in the LAD system that would explain the wall motion and normality.  At this point time continue on beta-blocker and diuretic with sliding scale insulin. We will check PFTs but also CPX to evaluate for pulmonary versus cardiac etiology.

## 2018-09-20 NOTE — Assessment & Plan Note (Signed)
Multiple stents in the SVG-PDA with some in-stent restenosis, anastomotic SVG-RI as well as native RI and native circumflex. I would like for him to be on long-term antiplatelet agent is still on aspirin plus Brilinta until January.  Plan will be to convert from Brilinta to Effient.  Cannot be sure if some of his dyspnea issues is related to Brilinta. After January, can stop aspirin. He is on Imdur which is giving him a headache along with Inderal which is for his headache and tremor.  No longer on amlodipine because his blood pressure is actually lower than it had been. Converting Imdur to Ranexa

## 2018-09-27 ENCOUNTER — Ambulatory Visit: Payer: Medicare Other | Admitting: Cardiology

## 2018-09-27 ENCOUNTER — Ambulatory Visit (HOSPITAL_COMMUNITY)
Admission: RE | Admit: 2018-09-27 | Discharge: 2018-09-27 | Disposition: A | Discharge status: Home / Self Care | Payer: Medicare Other | Source: Ambulatory Visit | Attending: Cardiology | Admitting: Cardiology

## 2018-09-27 DIAGNOSIS — I25700 Atherosclerosis of coronary artery bypass graft(s), unspecified, with unstable angina pectoris: Secondary | ICD-10-CM | POA: Insufficient documentation

## 2018-09-27 LAB — PULMONARY FUNCTION TEST
DL/VA % pred: 86 %
DL/VA: 4.06 ml/min/mmHg/L
DLCO UNC % PRED: 62 %
DLCO UNC: 20.91 ml/min/mmHg
FEF 25-75 PRE: 3.5 L/s
FEF 25-75 Post: 4.45 L/sec
FEF2575-%Change-Post: 27 %
FEF2575-%Pred-Post: 149 %
FEF2575-%Pred-Pre: 117 %
FEV1-%CHANGE-POST: 6 %
FEV1-%PRED-POST: 84 %
FEV1-%Pred-Pre: 79 %
FEV1-POST: 3.09 L
FEV1-Pre: 2.91 L
FEV1FVC-%Change-Post: 4 %
FEV1FVC-%Pred-Pre: 113 %
FEV6-%Change-Post: 2 %
FEV6-%PRED-POST: 74 %
FEV6-%Pred-Pre: 73 %
FEV6-PRE: 3.39 L
FEV6-Post: 3.48 L
FEV6FVC-%PRED-POST: 104 %
FEV6FVC-%PRED-PRE: 104 %
FVC-%CHANGE-POST: 1 %
FVC-%Pred-Post: 71 %
FVC-%Pred-Pre: 70 %
FVC-Post: 3.48 L
FVC-Pre: 3.42 L
PRE FEV6/FVC RATIO: 100 %
Post FEV1/FVC ratio: 89 %
Post FEV6/FVC ratio: 100 %
Pre FEV1/FVC ratio: 85 %
RV % PRED: 87 %
RV: 2.04 L
TLC % pred: 77 %
TLC: 5.58 L

## 2018-09-27 MED ORDER — ALBUTEROL SULFATE (2.5 MG/3ML) 0.083% IN NEBU
2.5000 mg | INHALATION_SOLUTION | Freq: Once | RESPIRATORY_TRACT | Status: AC
  Administered 2018-09-27: 2.5 mg via RESPIRATORY_TRACT

## 2018-09-29 ENCOUNTER — Telehealth: Payer: Self-pay | Admitting: *Deleted

## 2018-09-29 DIAGNOSIS — R942 Abnormal results of pulmonary function studies: Secondary | ICD-10-CM

## 2018-09-29 NOTE — Telephone Encounter (Signed)
-----   Message from Leonie Man, MD sent at 09/28/2018 11:18 PM EST ----- Lung function tests look to be somewhat abnormal. As a result, I think we should refer to pulmonary medicine for more detailed evaluation.  [name], MD  Pls place referral to Pulm Med - Dr. Chase Caller or Elsworth Soho.

## 2018-09-29 NOTE — Telephone Encounter (Signed)
Spoke to patient ,aware of results from PFT .  AWARE A REFERRAL PLACED FOR  PULM.  PULMONARY OFFICE WILL CALL WITH AN APPOINTMENT

## 2018-10-18 ENCOUNTER — Encounter (HOSPITAL_COMMUNITY): Payer: Medicare Other

## 2018-10-28 ENCOUNTER — Other Ambulatory Visit: Payer: Self-pay | Admitting: Physician Assistant

## 2018-11-01 ENCOUNTER — Other Ambulatory Visit: Payer: Self-pay | Admitting: Cardiology

## 2018-11-13 IMAGING — NM NM PULMONARY VENT & PERF
16 series · 16 of 16 positions shown · non-contrast
Comparison: None.

CLINICAL DATA: Chest pressure and shortness of breath

EXAM:
NUCLEAR MEDICINE VENTILATION - PERFUSION LUNG SCAN
TECHNIQUE: Ventilation images were obtained in multiple projections using
inhaled aerosol Zc-33m DTPA. Perfusion images were obtained in
multiple projections after intravenous injection of Zc-33m MAA.
RADIOPHARMACEUTICALS:  32.4 mCi of Zc-33m DTPA aerosol inhalation
and 4.28 mCi WcOOm MAA IV

[Series 1: ant-post vent · 2.07mm/px · 1 of 1 slices shown (1 of 2)]
[im 1/1]
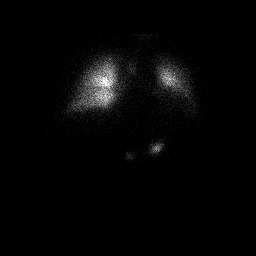

[Series 1: ant-post vent · 2.07mm/px · 1 of 1 slices shown (2 of 2)]
[im 1/1]
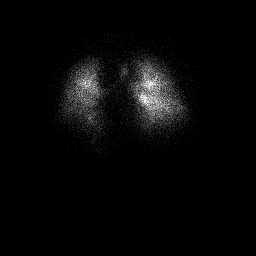

[Series 2: lao-rpo vent · 2.07mm/px · 1 of 1 slices shown (1 of 2)]
[im 1/1  full-range]
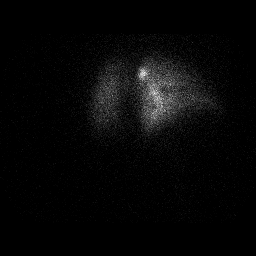

[Series 2: lao-rpo vent · 2.07mm/px · 1 of 1 slices shown (2 of 2)]
[im 1/1  full-range]
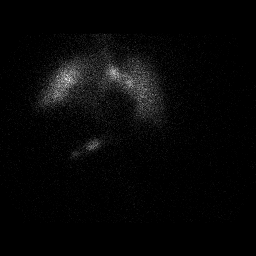

[Series 3: rao-lpo vent · 2.07mm/px · 1 of 1 slices shown (1 of 2)]
[im 1/1]
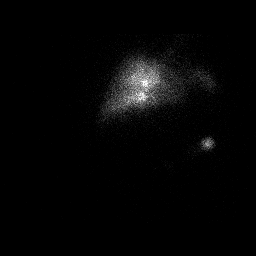

[Series 3: rao-lpo vent · 2.07mm/px · 1 of 1 slices shown (2 of 2)]
[im 1/1  full-range]
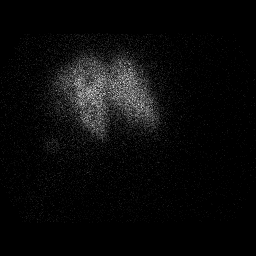

[Series 4: rlat-llat vent · 2.07mm/px · 1 of 1 slices shown (1 of 2)]
[im 1/1  full-range]
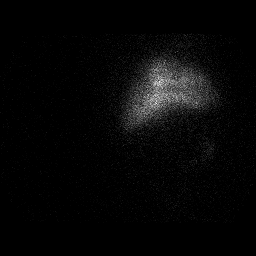

[Series 4: rlat-llat vent · 2.07mm/px · 1 of 1 slices shown (2 of 2)]
[im 1/1  full-range]
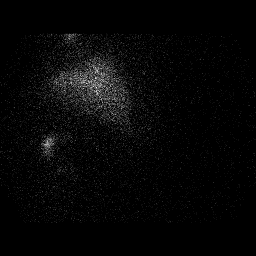

[Series 5: rlat-llat perf · 2.07mm/px · 1 of 1 slices shown (1 of 2)]
[im 1/1]
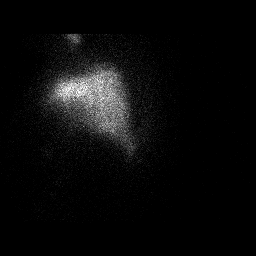

[Series 5: rlat-llat perf · 2.07mm/px · 1 of 1 slices shown (2 of 2)]
[im 1/1]
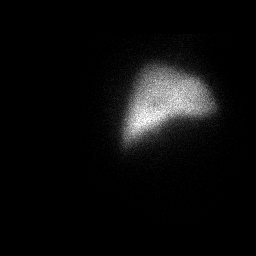

[Series 6: rao-lpo perf · 2.07mm/px · 1 of 1 slices shown (1 of 2)]
[im 1/1]
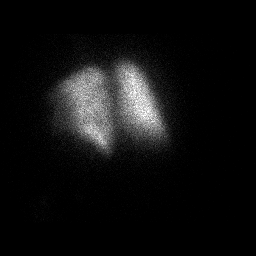

[Series 6: rao-lpo perf · 2.07mm/px · 1 of 1 slices shown (2 of 2)]
[im 1/1]
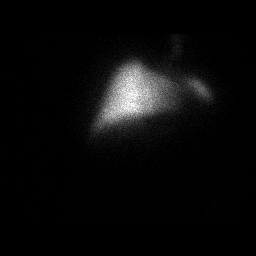

[Series 7: ant-post perf · 2.07mm/px · 1 of 1 slices shown (1 of 2)]
[im 1/1]
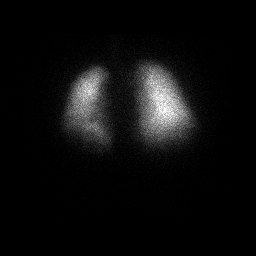

[Series 7: ant-post perf · 2.07mm/px · 1 of 1 slices shown (2 of 2)]
[im 1/1]
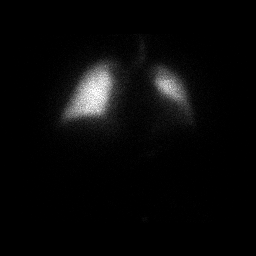

[Series 8: lao-rpo perf · 2.07mm/px · 1 of 1 slices shown (1 of 2)]
[im 1/1]
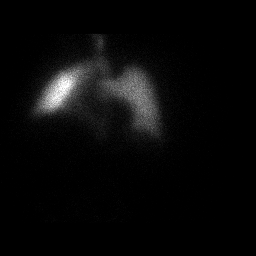

[Series 8: lao-rpo perf · 2.07mm/px · 1 of 1 slices shown (2 of 2)]
[im 1/1]
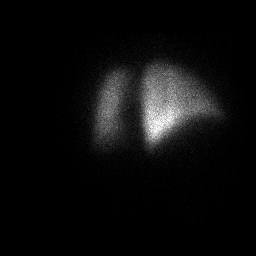

[16 of 16 positions shown; findings below may reference images not displayed]

FINDINGS: Ventilation: Multiple small matched defects on the left.

Perfusion: Multiple small matched defects on the left.
IMPRESSION: Multiple small matched defects on the left. No unmatched defects
noted. Very low probability V/Q scan.

## 2018-11-22 ENCOUNTER — Ambulatory Visit: Payer: Medicare Other | Admitting: Cardiology

## 2018-11-22 ENCOUNTER — Encounter: Payer: Self-pay | Admitting: Cardiology

## 2018-11-22 VITALS — BP 148/96 | HR 92 | Ht 71.0 in | Wt 290.4 lb

## 2018-11-22 DIAGNOSIS — I2510 Atherosclerotic heart disease of native coronary artery without angina pectoris: Secondary | ICD-10-CM | POA: Diagnosis not present

## 2018-11-22 DIAGNOSIS — I25700 Atherosclerosis of coronary artery bypass graft(s), unspecified, with unstable angina pectoris: Secondary | ICD-10-CM

## 2018-11-22 DIAGNOSIS — N183 Chronic kidney disease, stage 3 unspecified: Secondary | ICD-10-CM

## 2018-11-22 DIAGNOSIS — Z9861 Coronary angioplasty status: Secondary | ICD-10-CM

## 2018-11-22 DIAGNOSIS — E785 Hyperlipidemia, unspecified: Secondary | ICD-10-CM

## 2018-11-22 DIAGNOSIS — I5042 Chronic combined systolic (congestive) and diastolic (congestive) heart failure: Secondary | ICD-10-CM

## 2018-11-22 DIAGNOSIS — I10 Essential (primary) hypertension: Secondary | ICD-10-CM | POA: Diagnosis not present

## 2018-11-22 DIAGNOSIS — R0609 Other forms of dyspnea: Secondary | ICD-10-CM

## 2018-11-22 MED ORDER — DILTIAZEM HCL ER COATED BEADS 180 MG PO CP24: 180.0000 mg | ORAL_CAPSULE | Freq: Every day | ORAL | 3 refills | Status: DC

## 2018-11-22 NOTE — Assessment & Plan Note (Signed)
Multiple stents in the SVG of the PDA with mild in-stent restenosis.  Is also anastomotic SVG RI-RI and native circumflex PCI. Anticipate lifelong antiplatelet agent.  Converted from Brilinta to Effient with some improvement of dyspnea.  Plan was to discontinue aspirin. Statin intolerant now on Repatha. He takes propranolol for noncardiac reasons, and did not tolerate switching to different beta-blocker.  He is on Ranexa for microvascular disease.  Does not seem to be actively having angina symptoms unless dyspnea considered to be anginal equivalent which was not his prior symptom.

## 2018-11-22 NOTE — Progress Notes (Signed)
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 Ferries, PA-C . Jory Sims, DNP, ANP  You have been referred to PULMONARY MEDICINE for evaluation.  Any Other Special Instructions Will Be Listed Below (If Applicable).     Studies Ordered:   Orders Placed This Encounter  Procedures  . Lipid panel  . Comprehensive metabolic panel      [name], M.D., M.S. Interventional Cardiologist   Pager # [phone] Phone # [phone] [address] [place], [place] [address]   Thank you for choosing Heartcare at [facility]!!

## 2018-11-22 NOTE — Assessment & Plan Note (Signed)
He had lost down below 40 BMI, now back above 40 with weight gain.  I think a lot of this is directly related to dietary discretion between Christmas holidays and his birthday weekend.  He embellished about his large meals this past weekend  We talked quite extensively about the importance of him really watching what he eats and trying to lose weight.  He needs to lose back down to 270 pounds at a minimum in order for [place] to tell if he is dyspneic is related to anything else besides simply being obese.

## 2018-11-22 NOTE — Assessment & Plan Note (Signed)
He initially had significant results from his PCI in January of last year, unfortunately back to having dyspnea but no angina.  Follow-up right and left heart cath did not show any changes to his coronaries, and despite with elements of diastolic dysfunction, this was probably more of a isolated incident since he did not do very well with diuresis and ended up having his after reducing agents stopped.  PFTs are concerning for possible restrictive lung disease.  Based on his findings, will refer him to pulmonary medicine.

## 2018-11-22 NOTE — Assessment & Plan Note (Signed)
Hard to tell if he is euvolemic or not because he is notably heavier than he had been before and a lot of this tends to be related to dietary indiscretion and his lack of activity. He needs to lose 20 more pounds which would definitely help his dyspnea. With his tenuous renal function I would like to recheck chemistry panel as I have him restart his Lasix taking double dose for the first couple days and then back to baseline dose.  As of last visit, had issues with hypotension which precluded [place] from using afterload reduction. In lieu of his renal function being somewhat abnormal, would not restart ARB at this time. We will start diltiazem for additional rate control, antianginal effect and pulmonary venous dilation.

## 2018-11-22 NOTE — Assessment & Plan Note (Signed)
Blood pressure now finally up enough to reconsider adding back medicine.  It previously been on amlodipine, but we will use diltiazem to provide for some mild rate control as well as blood pressure and pulmonary vasodilation.

## 2018-11-22 NOTE — Patient Instructions (Addendum)
Medication Instructions:  START DILTIAZEM CD 180 MG ONE  CAPSULE DAILY  RESTART TAKING LASIX ( furosemide ) for th e first 3 days double (80 mg) the dose, then take daily.  If you need a refill on your cardiac medications before your next appointment, please call your pharmacy.   Lab work: CMP LIPID DO NOT EAT OR DRINK THE MORNING OF THE TEST.  If you have labs (blood work) drawn today and your tests are completely normal, you will receive your results only by: Marland Kitchen MyChart Message (if you have MyChart) OR . A paper copy in the mail If you have any lab test that is abnormal or we need to change your treatment, we will call you to review the results.  Testing/Procedures: NOT NEEDED  Follow-Up: At [facility], you and your health needs are our priority.  As part of our continuing mission to provide you with exceptional heart care, we have created designated Provider Care Teams.  These Care Teams include your primary Cardiologist (physician) and Advanced Practice Providers (APPs -  Physician Assistants and Nurse Practitioners) who all work together to provide you with the care you need, when you need it. You will need a follow up appointment in 4 months June 2020.  Please call our office 2 months in advance to schedule this appointment.  You may see [name], MD  or one of the following Advanced Practice Providers on your designated Care Team:   Rosaria Ferries, PA-C . Jory Sims, DNP, ANP  You have been referred to PULMONARY MEDICINE for evaluation.  Any Other Special Instructions Will Be Listed Below (If Applicable).

## 2018-11-22 NOTE — Assessment & Plan Note (Signed)
Finally back on Repatha.  Should be due for lab follow-up.  We will go ahead and order Lipid panel along with his chemistry panel.

## 2018-11-22 NOTE — Assessment & Plan Note (Signed)
Need to recheck chemistry panel.  Unfortunately lab is closed today.  Can have done this week or early next week.  For now continue to avoid ARB.

## 2018-11-22 NOTE — Assessment & Plan Note (Signed)
3-4 grafts patent with significant native disease.  Extensive stents in the vein graft to the RCA as well as native circumflex and SVG-RI into native RI. On stable cardiac regimen at this point and it does not seem like his dyspnea is truly cardiac in nature.

## 2018-12-06 ENCOUNTER — Ambulatory Visit: Payer: Medicare Other | Admitting: Pulmonary Disease

## 2018-12-06 ENCOUNTER — Encounter: Payer: Self-pay | Admitting: Pulmonary Disease

## 2018-12-06 DIAGNOSIS — Z9989 Dependence on other enabling machines and devices: Secondary | ICD-10-CM

## 2018-12-06 DIAGNOSIS — G4733 Obstructive sleep apnea (adult) (pediatric): Secondary | ICD-10-CM | POA: Diagnosis not present

## 2018-12-06 DIAGNOSIS — R0609 Other forms of dyspnea: Secondary | ICD-10-CM
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
## 2018-12-06 NOTE — Patient Instructions (Signed)
You have mild restriction on your lung function tests but lungs appear normal on CT scan  We will send in prescription to [place] for new auto CPAP 5 to 12 cm Obtain sleep studies from [facility]

## 2018-12-06 NOTE — Assessment & Plan Note (Signed)
We will send in prescription to [place] for new auto CPAP 5 to 12 cm Obtain sleep studies from [facility] We will obtain a download in a month and tweak settings as required  Weight loss encouraged, compliance with goal of at least 4-6 hrs every night is the expectation. Advised against medications with sedative side effects Cautioned against driving when sleepy - understanding that sleepiness will vary on a day to day basis

## 2018-12-06 NOTE — Assessment & Plan Note (Signed)
Mild restriction on PFTs, DLCO is decreased but corrects for alveolar volume This is more likely related to obesity rather than intraparenchymal cause. CT abdomen was reviewed which shows normal lung bases, so no evidence of ILD on chest x-ray. No evidence of pulmonary hypertension on echo No evidence of fluid overload  Wheezing appears to be intermittent including occasionally at rest-no evidence of obstructive lung disease, doubt albuterol MDI necessary at this time. Would advocate for cardiac rehab program. Medication list reviewed and doubt any of these medications are causing his symptoms

## 2019-03-07 ENCOUNTER — Telehealth: Payer: Self-pay | Admitting: Cardiology

## 2019-03-07 NOTE — Telephone Encounter (Signed)
    COVID-19 Pre-Screening Questions:  . In the past 7 to 10 days have you had a cough,  shortness of breath, headache, congestion, fever (100 or greater) body aches, chills, sore throat, or sudden loss of taste or sense of smell?NO . Have you been around anyone with known Covid 19. NO . Have you been around anyone who is awaiting Covid 19 test results in the past 7 to 10 days?NO . Have you been around anyone who has been exposed to Covid 19, or has mentioned symptoms of Covid 19 within the past 7 to 10 days? NO  If you have any concerns/questions about symptoms patients report during screening (either on the phone or at threshold). Contact the provider seeing the patient or DOD for further guidance.  If neither are available contact a member of the leadership team.  [place]

## 2019-03-07 NOTE — Telephone Encounter (Signed)
"  Bad phone reception", wants to use iPad/mychart for video. Call pt home phone to contact, no cell service--[phone]. Pre-reg complete, Mychart active, verbal consent given 03/07/2019 MS

## 2019-03-07 NOTE — Telephone Encounter (Signed)
Agree with plan to call EMS if Sx recur requiring NTG -- concern for Unstable Angina.  [name], RN working on APP visit this week.  For now- increase Ranexa to 1000 mg (2 tab) BID until seen.  [name]

## 2019-03-07 NOTE — Telephone Encounter (Signed)
Pt called to report that he had had 3 episodes of pressure in his chest.. he denies dizziness, headache, sob... but pressure comes on suddenly.   10 am yesterday while watching TV he had extreme pressure relieved by 2 SL Nitro  10 pm last night.. pressure while resting relived after 2 nitro  4 am he was woken up with pressure and relieved after 2 nitro  Pt has extensive CAD history... he feels well now.. BP 140/84... last OV 11/2018 next due 03/22/19.   With this information will forward to Dr. Ellyn Hack.. Pt agrees if pressure returns until we call back.. he will call EMS.

## 2019-03-07 NOTE — Telephone Encounter (Signed)
Spoke to patient - per dr harding. Patient aware to increase ranexa 1000 mg bid ,appt schedule for tomorrow  11:30 am w/extender. Patient verbalized understanding- aware if chest develop again call EMS  And ER

## 2019-03-07 NOTE — Telephone Encounter (Signed)
New message:   Patient calling concerning that he his having some discomfort in his chest. Took 2 nitroglycerin and it eased up some not much. Patient would like for some one to call him.

## 2019-03-08 ENCOUNTER — Encounter: Payer: Self-pay | Admitting: Cardiology

## 2019-03-08 ENCOUNTER — Ambulatory Visit: Payer: Medicare Other | Admitting: Cardiology

## 2019-03-08 ENCOUNTER — Other Ambulatory Visit: Payer: Self-pay

## 2019-03-08 VITALS — BP 130/78 | HR 71 | Temp 97.4°F | Ht 70.0 in | Wt 289.0 lb

## 2019-03-08 DIAGNOSIS — Z9989 Dependence on other enabling machines and devices: Secondary | ICD-10-CM

## 2019-03-08 DIAGNOSIS — R0609 Other forms of dyspnea: Secondary | ICD-10-CM

## 2019-03-08 DIAGNOSIS — R079 Chest pain, unspecified: Secondary | ICD-10-CM | POA: Diagnosis not present

## 2019-03-08 DIAGNOSIS — N184 Chronic kidney disease, stage 4 (severe): Secondary | ICD-10-CM | POA: Diagnosis not present

## 2019-03-08 DIAGNOSIS — E785 Hyperlipidemia, unspecified: Secondary | ICD-10-CM

## 2019-03-08 DIAGNOSIS — I10 Essential (primary) hypertension: Secondary | ICD-10-CM

## 2019-03-08 DIAGNOSIS — R0600 Dyspnea, unspecified: Secondary | ICD-10-CM

## 2019-03-08 DIAGNOSIS — I25700 Atherosclerosis of coronary artery bypass graft(s), unspecified, with unstable angina pectoris: Secondary | ICD-10-CM | POA: Diagnosis not present

## 2019-03-08 DIAGNOSIS — G4733 Obstructive sleep apnea (adult) (pediatric): Secondary | ICD-10-CM

## 2019-03-08 MED ORDER — ISOSORBIDE MONONITRATE ER 30 MG PO TB24: 30.0000 mg | ORAL_TABLET | Freq: Every day | ORAL | 3 refills | Status: DC

## 2019-03-08 MED ORDER — PANTOPRAZOLE SODIUM 40 MG PO TBEC: 40.0000 mg | DELAYED_RELEASE_TABLET | Freq: Every day | ORAL | 11 refills | Status: DC

## 2019-03-08 NOTE — Progress Notes (Signed)
03/08/2019 [name]   07/06/1956  [id number]  Primary Physician Meryle Ready, MD Primary Cardiologist: Dr Ellyn Hack  HPI: The patient is a pleasant 63 year old male followed by Dr. Ellyn Hack.  He had CABG in 2002.  He has had multiple interventions since.  His last intervention was January 2019 to the full metal jacket SVG-RCA dor ISR.   His last catheterization was in November 2019, the previously intervened on RCA graft was patent and the plan was for continued medical therapy.  His last ejection fraction was 45% by echo in November 2019.  The patient has had some issues with dyspnea.  He was changed from Brilinta to Effient.  He had a pulmonary evaluation, ultimately it was felt that obesity was most likely the cause of his dyspnea.  He does have sleep apnea and says he uses CPAP nightly.  Recently he says he is actually been doing well.  His dyspnea is improved.  A few days ago though he was watching TV and he had midsternal chest discomfort described as indigestion and associated with diaphoresis.  He took nitroglycerin x2 with eventual relief.  A couple days later [place] was [place].  He was not having chest pain while walking but when he went in and sat down on the couch she had another episode of "indigestion".  He again took nitroglycerin x2.  Last evening he woke up to go use the bathroom and again had indigestion and took nitroglycerin.  He called the office and is seen today as an add-on.  His EKG shows no acute changes.   Current Outpatient Medications  Medication Sig Dispense Refill   aspirin EC 81 MG EC tablet Take 1 tablet (81 mg total) by mouth daily.     buPROPion (WELLBUTRIN SR) 150 MG 12 hr tablet Take 150 mg by mouth 2 (two) times daily.     clonazePAM (KLONOPIN) 2 MG tablet Take 1 tablet (2 mg total) by mouth at bedtime. 30 tablet 0   cyclobenzaprine (FLEXERIL) 10 MG tablet Take 10 mg by mouth 3 (three) times daily as needed for muscle  spasms.      diltiazem (CARDIZEM CD) 180 MG 24 hr capsule Take 1 capsule (180 mg total) by mouth daily. 90 capsule 3   Evolocumab (REPATHA SURECLICK) 976 MG/ML SOAJ Inject 140 mg into the skin every 14 (fourteen) days. 6 pen 4   furosemide (LASIX) 40 MG tablet TAKE 40 MG TABLET  DAILY , IF WEIGHT GAINS GREATER THAN 3 LBS TAKE AN EXTRA 40 MG TABLET AS NEEDED DAILY 180 tablet 3   nitroGLYCERIN (NITROSTAT) 0.4 MG SL tablet PLACE 1TAB UNDER TONGUE EVERY 5MIN FOR 3DOSES AS NEEDED FOR CHEST PAIN 75 tablet 2   oxyCODONE-acetaminophen (PERCOCET) 5-325 MG tablet Take 1-2 tablets by mouth every 6 (six) hours as needed for moderate pain or severe pain. Post-operatively 20 tablet 0   potassium chloride SA (K-DUR,KLOR-CON) 20 MEQ tablet Take 20 meq twice a day with Lasix (Patient taking differently: Take 20 mEq by mouth 2 (two) times daily. Take 20 meq twice a day with Lasix) 60 tablet 6   prasugrel (EFFIENT) 5 MG TABS tablet Take 5 mg by mouth daily.     propranolol (INDERAL) 20 MG tablet TAKE 1 TABLET BY MOUTH THREE TIMES A DAY 270 tablet 1   ranolazine (RANEXA) 500 MG 12 hr tablet Take 1 tablet (500 mg total) by mouth 2 (two) times daily. (Patient taking differently: Take 1,000  mg by mouth 2 (two) times daily. ) 180 tablet 3   isosorbide mononitrate (IMDUR) 30 MG 24 hr tablet Take 1 tablet (30 mg total) by mouth daily. 90 tablet 3   pantoprazole (PROTONIX) 40 MG tablet Take 1 tablet (40 mg total) by mouth daily. 30 tablet 11   No current facility-administered medications for this visit.     Allergies  Allergen Reactions   Crestor [Rosuvastatin] Other (See Comments)    REACTION: Muscle aches   Imdur [Isosorbide Nitrate]     headache   Lipitor [Atorvastatin] Cough    ? cough   Lisinopril Cough    CHANGE TO LOSARTAN   Zocor [Simvastatin] Other (See Comments)    REACTION: Muscle aches    Past Medical History:  Diagnosis Date   Anginal pain ([place])    Arthritis    "left shoulder;  back" (08/14/2018)   Bradycardia by electrocardiogram 2006   Medtronic PPM   CAD (coronary artery disease) of artery bypass graft, with stent to VG-OM-(BMS) 2017   a. 06/29/16 PCI -DES x2 to SVG-RPDA (ostial SVG-RCA was for ISR), SVG-OM1 stent 100%. Patent LIMA-LAD & SVG-RI .  Native Cx - extensive stents with occluded OM1 and OM 2. 10/2017:  pLAD 100% (patent LIMA - dLAD 70%). NEW: p-mCx 100% @ prior stent (now Cx & OM1/OM2 occluded - fill via collatrals from RI). SVG-rPDA  80% ISR (PTCA), anastomotic SVG-RI 80% with 80% into Diag branch of RI (DES PCI)   CAD (coronary artery disease), with CABG LIMA-LAD, VG-ramus intermediate, SVG-OM, SVG-PDA 2002   Cath September 2017: patent LIMA-LAD, patent SVG-RI, occluded SVG-OM1 with patent stents in LCx and RI.  Severe ISR in SVG-RCA with severe mid disease.  Occluded OM1, OM 2, mid LAD and mid RCA.  Distal LAD 70%, distal LCx --2 overlapping DES stents to SVG-RCA;; 11/10/17: ISR of SVG-RCA  (~70%). 80% distal SVG-RI with RI lesion. CTO of LCx & OM stents -> PCI SVG-RI into RI & PTCA of SVG-RCA   CHF (congestive heart failure) (HCC)    Chronic lower back pain     CKD (chronic kidney disease)    "left kidney atrophied" (08/14/2018)   Depression    Dyslipidemia, with intolerance to Crestor and zocor and possible cough with Lipitor     History of blood transfusion 04/2001   "when I had bypass OR"   History of kidney stones    "I've had several stones" (08/14/2018)   HTN (hypertension)     Myocardial infarction ([name])    hx 6 heart attacks; 2 were in April 2014 and then September 2017 (08/14/2018)   Obesity (BMI 30-39.9)    OSA on CPAP     Shortness of breath dyspnea    unable to climb a set of stairs   Tremor, essential, treated with propranolol      Social History   Socioeconomic History   Marital status: Married    Spouse name: Not on file   Number of children: Not on file   Years of education: Not on file   Highest education  level: Not on file  Occupational History   Occupation: Disabled  Scientist, product/process development strain: Not on file   Food insecurity:    Worry: Not on file    Inability: Not on file   Transportation needs:    Medical: Not on file    Non-medical: Not on file  Tobacco Use   Smoking status: Never Smoker   Smokeless tobacco:  Never Used  Substance and Sexual Activity   Alcohol use: Not Currently   Drug use: Not Currently   Sexual activity: Not Currently  Lifestyle   Physical activity:    Days per week: Not on file    Minutes per session: Not on file   Stress: Not on file  Relationships   Social connections:    Talks on phone: Not on file    Gets together: Not on file    Attends religious service: Not on file    Active member of club or organization: Not on file    Attends meetings of clubs or organizations: Not on file    Relationship status: Not on file   Intimate partner violence:    Fear of current or ex partner: Not on file    Emotionally abused: Not on file    Physically abused: Not on file    Forced sexual activity: Not on file  Other Topics Concern   Not on file  Social History Narrative   Lives with wife.     Family History  Problem Relation Age of Onset   Heart disease Father    Hyperlipidemia Father    Hypertension Father    Diabetes Father    Heart murmur Mother      Review of Systems: General: negative for chills, fever, night sweats or weight changes.  Cardiovascular: negative for chest pain, dyspnea on exertion, edema, orthopnea, palpitations, paroxysmal nocturnal dyspnea or shortness of breath Dermatological: negative for rash Respiratory: negative for cough or wheezing Urologic: negative for hematuria Abdominal: negative for nausea, vomiting, diarrhea, bright red blood per rectum, melena, or hematemesis Neurologic: negative for visual changes, syncope, or dizziness All other systems reviewed and are otherwise negative  except as noted above.    Blood pressure 130/78, pulse 71, temperature (!) 97.4 F (36.3 C), height 5\' 10"  (1.778 m), weight 289 lb (131.1 kg).  General appearance: alert, cooperative, no distress and moderately obese Neck: no JVD Lungs: clear to auscultation bilaterally Heart: regular rate and rhythm Extremities: no edema Skin: Skin color, texture, turgor normal. No rashes or lesions Neurologic: Grossly normal  EKG NSR, inferior Qs-HR 71  ASSESSMENT AND PLAN:   Chest pain with moderate risk of acute coronary syndrome Symptoms could be secondary to angina or possible esophageal spasm- add low dose nitrate and PPI.  If no improvement he will need re study  CAD-CABG 2002 (LIMA-LAD, VG-RI, SVG-OM-multiple PCIs since Last PCI was to full metal jacket SVG-RCA for ISR.  Re look cath Nov 2019 showed this to be patent  DOE (dyspnea on exertion) Chronic, felt to be secondary to obesity after pulmonary evaluation by Dr Elsworth Soho  CKD (chronic kidney disease) stage 4, GFR 15-29 ml/min (HCC) Last GFR was in the 20's  OSA on CPAP Compliant  Essential hypertension Controlled   PLAN Dr. Ellyn Hack suggested he increase his Ranexa to 1 g twice daily yesterday.  The patient says he has had problems with Imdur in the past secondary to headaches.  The nitroglycerin does seem to help.  I reviewed his case and his EKG with Dr. Ellyn Hack who is in the office today.  Were going to add a PPI and start him on low-dose Imdur at 15 mg a day for 3 days with plans to increase it to 30 mg a day if he can tolerate it.  If he has chest pain despite this will need to consider repeat coronary angiogram though he is a high risk with stage  IV renal insufficiency.  He will call us next week and let us know how he is doing, if he has recurrent symptoms over the weekend he will go to [facility] ER.  Kerin Ransom PA-C 03/08/2019 12:10 PM

## 2019-03-08 NOTE — Patient Instructions (Signed)
Medication Instructions:  START Protonix 40 mg Take 1 tablet once a day  START Imdur 30mg  Take half tablet once a day for 3 days then 1 whole tablet daily  If you need a refill on your cardiac medications before your next appointment, please call your pharmacy.   Lab work: None  If you have labs (blood work) drawn today and your tests are completely normal, you will receive your results only by: Marland Kitchen MyChart Message (if you have MyChart) OR . A paper copy in the mail If you have any lab test that is abnormal or we need to change your treatment, we will call you to review the results.  Testing/Procedures: None   Follow-Up: At [facility], you and your health needs are our priority.  As part of our continuing mission to provide you with exceptional heart care, we have created designated Provider Care Teams.  These Care Teams include your primary Cardiologist (physician) and Advanced Practice Providers (APPs -  Physician Assistants and Nurse Practitioners) who all work together to provide you with the care you need, when you need it. Marland Kitchen LUKE RECOMMENDS YOU FOLLOW UP WITH DR HARDING AS SCHEDULED   Any Other Special Instructions Will Be Listed Below (If Applicable).

## 2019-03-08 NOTE — Assessment & Plan Note (Signed)
Symptoms could be secondary to angina or possible esophageal spasm- add low dose nitrate and PPI.  If no improvement he will need re study

## 2019-03-08 NOTE — Assessment & Plan Note (Signed)
Last GFR was in the 20's

## 2019-03-08 NOTE — Assessment & Plan Note (Signed)
Chronic, felt to be secondary to obesity after pulmonary evaluation by Dr Elsworth Soho

## 2019-03-08 NOTE — Assessment & Plan Note (Signed)
Last PCI was to full metal jacket SVG-RCA for ISR.  Re look cath Nov 2019 showed this to be patent

## 2019-03-08 NOTE — Assessment & Plan Note (Signed)
Controlled.  

## 2019-03-08 NOTE — Assessment & Plan Note (Signed)
Compliant 

## 2019-03-12 ENCOUNTER — Telehealth: Payer: Self-pay | Admitting: Cardiology

## 2019-03-12 NOTE — Telephone Encounter (Signed)
Spoke with pt, he was calling to let us know he has not had anymore trouble with chest pain. He feels the isosorbide is working well for him. He will call with issues. Will make luke kilroy aware.

## 2019-03-12 NOTE — Telephone Encounter (Signed)
New Message    Pt c/o medication issue:  1. Name of Medication: Isosorbide   2. How are you currently taking this medication (dosage and times per day)? 30mg  1 x daily   3. Are you having a reaction (difficulty breathing--STAT)? No   4. What is your medication issue? Pt is calling to let the nurse know that the medication is working well for him and has had no symptoms

## 2019-03-21 ENCOUNTER — Other Ambulatory Visit: Payer: Self-pay

## 2019-03-21 ENCOUNTER — Encounter: Payer: Self-pay | Admitting: Cardiology

## 2019-03-21 ENCOUNTER — Ambulatory Visit: Payer: Medicare Other | Admitting: Cardiology

## 2019-03-21 DIAGNOSIS — I25700 Atherosclerosis of coronary artery bypass graft(s), unspecified, with unstable angina pectoris: Secondary | ICD-10-CM

## 2019-03-21 DIAGNOSIS — I5042 Chronic combined systolic (congestive) and diastolic (congestive) heart failure: Secondary | ICD-10-CM

## 2019-03-21 DIAGNOSIS — R0609 Other forms of dyspnea: Secondary | ICD-10-CM

## 2019-03-21 DIAGNOSIS — I10 Essential (primary) hypertension: Secondary | ICD-10-CM

## 2019-03-21 DIAGNOSIS — Z9861 Coronary angioplasty status: Secondary | ICD-10-CM

## 2019-03-21 DIAGNOSIS — E785 Hyperlipidemia, unspecified: Secondary | ICD-10-CM

## 2019-03-21 DIAGNOSIS — I2510 Atherosclerotic heart disease of native coronary artery without angina pectoris: Secondary | ICD-10-CM

## 2019-03-21 MED ORDER — ISOSORBIDE MONONITRATE ER 30 MG PO TB24: 30.0000 mg | ORAL_TABLET | Freq: Every evening | ORAL | 3 refills | Status: DC

## 2019-03-21 MED ORDER — DILTIAZEM HCL ER COATED BEADS 240 MG PO CP24: 240.0000 mg | ORAL_CAPSULE | Freq: Every day | ORAL | 3 refills | Status: AC
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
## 2019-03-21 NOTE — Patient Instructions (Signed)
Medication Instructions:  TAKE Imdur daily in the evening TAKE 500mg  or 650mg  of Tylenol 45 minutes prior to taking Imdur INCREASE Diltiazem 240mg  take 1 tablet once a day  STOP Aspirin  If you need a refill on your cardiac medications before your next appointment, please call your pharmacy.   Lab work: None  If you have labs (blood work) drawn today and your tests are completely normal, you will receive your results only by: Marland Kitchen MyChart Message (if you have MyChart) OR . A paper copy in the mail If you have any lab test that is abnormal or we need to change your treatment, we will call you to review the results.  Testing/Procedures: None   Follow-Up: At [facility], you and your health needs are our priority.  As part of our continuing mission to provide you with exceptional heart care, we have created designated Provider Care Teams.  These Care Teams include your primary Cardiologist (physician) and Advanced Practice Providers (APPs -  Physician Assistants and Nurse Practitioners) who all work together to provide you with the care you need, when you need it. You will need a follow up appointment in 3 months.  Please call our office 2 months in advance to schedule this appointment.  You may see [name], MD or one of the following Advanced Practice Providers on your designated Care Team:   Rosaria Ferries, PA-C . Jory Sims, DNP, ANP  Any Other Special Instructions Will Be Listed Below (If Applicable).

## 2019-03-22 ENCOUNTER — Ambulatory Visit: Payer: Medicare Other | Admitting: Cardiology

## 2019-03-23 ENCOUNTER — Encounter: Payer: Self-pay | Admitting: Cardiology

## 2019-03-23 NOTE — Assessment & Plan Note (Signed)
Multiple stents in the SVG-PDA as well as anastomotic SVG-RI into the native RI.  Anticipate lifelong Thienopyridine but okay to stop aspirin.  Currently tolerating Effient well.  Had bleeding issues with Brilinta.  At this point though he is okay holding Effient for procedures if necessary.

## 2019-03-23 NOTE — Assessment & Plan Note (Signed)
Blood pressure is high today.  Plan: Titrate diltiazem dose higher to 240 mg daily.  Could consider ARB or hydralazine if more blood pressure control is required.

## 2019-03-23 NOTE — Assessment & Plan Note (Signed)
Now back on Repatha.  Need to get his labs results

## 2019-03-23 NOTE — Assessment & Plan Note (Signed)
Chronic.  Clearly related to obesity, deconditioning and also diastolic dysfunction.  Not necessarily thought to be much related to pulmonary issues.

## 2019-03-23 NOTE — Assessment & Plan Note (Signed)
Relook cath numbers 2019 showed patent stents etc.  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  Much better control since restarting.  Only issue is the headache.  Recommend taking Imdur 30 minutes after his aspirin and also preceded with Tylenol.  Continue Ranexa 1000 mg twice daily.  Continue combination of beta-blocker and calcium channel blocker, however I will increase his diltiazem dose to 240 mg daily for additional blood pressure and antianginal effect.  Is on propranolol for headache and tremors, we could potentially titrate this up as well.

## 2019-03-23 NOTE — Assessment & Plan Note (Signed)
Seems relatively euvolemic at this point.  Is really not noticing any PND orthopnea or edema.  He is on stable Lasix.  He is on a beta-blocker, but not on ARB since he is now on diltiazem

## 2019-06-14 ENCOUNTER — Other Ambulatory Visit: Payer: Self-pay | Admitting: Neurological Surgery

## 2019-06-14 DIAGNOSIS — M542 Cervicalgia: Secondary | ICD-10-CM

## 2019-06-21 ENCOUNTER — Ambulatory Visit
Admission: RE | Admit: 2019-06-21 | Discharge: 2019-06-21 | Disposition: A | Discharge status: Home / Self Care | Payer: Medicare Other | Source: Ambulatory Visit | Attending: Neurological Surgery | Admitting: Neurological Surgery

## 2019-06-21 ENCOUNTER — Other Ambulatory Visit: Payer: Self-pay

## 2019-06-21 DIAGNOSIS — M542 Cervicalgia: Secondary | ICD-10-CM

## 2019-07-02 ENCOUNTER — Other Ambulatory Visit: Payer: Self-pay

## 2019-07-02 ENCOUNTER — Ambulatory Visit (INDEPENDENT_AMBULATORY_CARE_PROVIDER_SITE_OTHER): Payer: Medicare Other | Admitting: Cardiology

## 2019-07-02 ENCOUNTER — Encounter: Payer: Self-pay | Admitting: Cardiology

## 2019-07-02 VITALS — BP 136/78 | HR 69 | Temp 99.3°F | Ht 70.0 in | Wt 296.0 lb

## 2019-07-02 DIAGNOSIS — I5042 Chronic combined systolic (congestive) and diastolic (congestive) heart failure: Secondary | ICD-10-CM | POA: Diagnosis not present

## 2019-07-02 DIAGNOSIS — I2510 Atherosclerotic heart disease of native coronary artery without angina pectoris: Secondary | ICD-10-CM | POA: Diagnosis not present

## 2019-07-02 DIAGNOSIS — I25700 Atherosclerosis of coronary artery bypass graft(s), unspecified, with unstable angina pectoris: Secondary | ICD-10-CM | POA: Diagnosis not present

## 2019-07-02 DIAGNOSIS — R0609 Other forms of dyspnea: Secondary | ICD-10-CM

## 2019-07-02 DIAGNOSIS — Z9861 Coronary angioplasty status: Secondary | ICD-10-CM

## 2019-07-02 DIAGNOSIS — G250 Essential tremor: Secondary | ICD-10-CM

## 2019-07-02 DIAGNOSIS — G4733 Obstructive sleep apnea (adult) (pediatric): Secondary | ICD-10-CM

## 2019-07-02 DIAGNOSIS — Z01810 Encounter for preprocedural cardiovascular examination: Secondary | ICD-10-CM | POA: Diagnosis not present

## 2019-07-02 DIAGNOSIS — I10 Essential (primary) hypertension: Secondary | ICD-10-CM

## 2019-07-02 DIAGNOSIS — Z9989 Dependence on other enabling machines and devices: Secondary | ICD-10-CM

## 2019-07-02 DIAGNOSIS — E785 Hyperlipidemia, unspecified: Secondary | ICD-10-CM

## 2019-07-02 NOTE — Patient Instructions (Addendum)
Medication Instructions:  No changes  If you need a refill on your cardiac medications before your next appointment, please call your pharmacy.   Lab work: Lipid cmp If you have labs (blood work) drawn today and your tests are completely normal, you will receive your results only by: Marland Kitchen MyChart Message (if you have MyChart) OR . A paper copy in the mail If you have any lab test that is abnormal or we need to change your treatment, we will call you to review the results.  Testing/Procedures: Not needed  Follow-Up: At [facility], you and your health needs are our priority.  As part of our continuing mission to provide you with exceptional heart care, we have created designated Provider Care Teams.  These Care Teams include your primary Cardiologist (physician) and Advanced Practice Providers (APPs -  Physician Assistants and Nurse Practitioners) who all work together to provide you with the care you need, when you need it. . You will need a follow up appointment in 6  Months march 2021.  Please call our office 2 months in advance to schedule this appointment.  You may see [name], MD or one of the following Advanced Practice Providers on your designated Care Team:   . Rosaria Ferries, PA-C . Jory Sims, DNP, ANP  Any Other Special Instructions Will Be Listed Below (If Applicable).   will discuus  Pre op for neck surgery in NOV 2020

## 2019-07-02 NOTE — Progress Notes (Signed)
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 intrathoracic or suprainguinal vascular  Active CAD: no; revascularized, and currently angina free on current meds.  CHF: yes; however stable  Cerebrovascular Disease: no;   Diabetes: no; On Insulin: no  CKD (Cr >~ 2): no; 1.53  Total: ~2 (but controlled) Estimated Risk of Adverse Outcome: Class III  Estimated Risk of MI, PE, VF/VT (Cardiac Arrest), Complete Heart Block: ~10 % -> however this risk is reduced to ~3-6% as he is on stable dose of beta-blocker, and his angina and heart failure is relatively stable. --> He is on adequate cardiac medication for protection.   ACC/AHA Guidelines for "Clearance":  Step 1 - Need for Emergency Surgery: No: Semi-elective  If Yes - go straight to OR with perioperative surveillance  Step 2 - Active Cardiac Conditions (Unstable Angina, Decompensated HF,  Significant  Arrhytmias - Complete HB, Mobitz II, Symptomatic VT or SVT, Severe Aortic Stenosis - mean gradient > 40 mmHg, Valve area < 1.0 cm2):   No: No active angina (currently controlled) or decompensated heart failure  If Yes - Evaluate & Treat per ACC/AHA Guidelines  Step 3 -  Low Risk Surgery: Yes  If Yes --> proceed to OR  If No --> Step 4  Step 4 - Functional Capacity >= 4 METS without symptoms: Yes -> able to walk 1/2 miles a day  If Yes --> proceed to OR  If No --> Step 5  Step 5 --  Clinical Risk Factors (CRF)   1-2 or more CRFs: Yes  If Yes -- assess Surgical Risk, --   (High Risk Non-cardiac), Intraabdominal or thoracic vascular surgery --> Proceed to OR, or consider testing if it will change management.  Intermediate Risk: Proceed to OR with HR control, or consider testing if it will change management  Low Risk: Proceed to OR --> further cardiovascular testing would not change current management as he is optimized  As of present, INDERJIT HOKENSON is adequately managed from a cardiovascular standpoint.  Further testing would not change management.  He has been recently revascularized with relook catheterization showing stable grafts and stents.  Echocardiogram shows stable mildly reduced EF and his HFpEF is well controlled.   Would hold Effient 9 days prior to surgery -> restart 1 to 2 days postop if stable from bleeding standpoint.         Relevant Orders   EKG 12-Lead (Completed)   CKD (chronic kidney disease) stage 4, GFR 15-29 ml/min (HCC)      I spent a total of 22 minutes with the patient and chart review. >  50% of the time was spent in direct patient consultation.  Spent an additional almost 15 minutes reviewing chart and doing preoperative assessment.  He mentioned this near the end of  the visit.  We discussed it briefly. --> Total time 40 to 45 minutes.  Current medicines are reviewed at length with the patient today.  (+/- concerns)  none Patient Instructions  Medication Instructions:  No changes  If you need a refill on your cardiac medications before your next appointment, please call your pharmacy.   Lab work: Lipid cmp If you have labs (blood work) drawn today and your tests are completely normal, you will receive your results only by:  [facility] (if you have MyChart) OR  A paper copy in the mail If you have any lab test that is abnormal or we need to change your treatment, we will call you to review the results.  Testing/Procedures: Not needed  Follow-Up: At [facility], you and your health needs are our priority.  As part of our continuing mission to provide you with exceptional heart care, we have created designated Provider Care Teams.  These Care Teams include your primary Cardiologist (physician) and Advanced Practice Providers (APPs -  Physician Assistants and Nurse Practitioners) who all work together to provide you with the care you need, when you need it.  You will need a follow up appointment in 6  Months march 2021.  Please call our office 2 months in advance to schedule this appointment.  You may see [name], MD or one of the following Advanced Practice Providers on your designated Care Team:    Rosaria Ferries, PA-C  Jory Sims, DNP, ANP  Any Other Special Instructions Will Be Listed Below (If Applicable).   will discuus  Pre op for neck surgery in NOV 2020    [name], M.D., M.S. Interventional Cardiologist   Pager # [phone] Phone # [phone] [address] [place], [place] [address]   Thank you for choosing Heartcare at [facility]!!

## 2019-07-03 LAB — COMPREHENSIVE METABOLIC PANEL
ALT: 35 IU/L (ref 0–44)
AST: 42 IU/L — ABNORMAL HIGH (ref 0–40)
Albumin/Globulin Ratio: 1.4 (ref 1.2–2.2)
Albumin: 4.3 g/dL (ref 3.8–4.8)
Alkaline Phosphatase: 87 IU/L (ref 39–117)
BUN/Creatinine Ratio: 10 (ref 10–24)
BUN: 15 mg/dL (ref 8–27)
Bilirubin Total: 1 mg/dL (ref 0.0–1.2)
CO2: 22 mmol/L (ref 20–29)
Calcium: 9.6 mg/dL (ref 8.6–10.2)
Chloride: 101 mmol/L (ref 96–106)
Creatinine, Ser: 1.53 mg/dL — ABNORMAL HIGH (ref 0.76–1.27)
GFR calc Af Amer: 55 mL/min/{1.73_m2} — ABNORMAL LOW (ref >59)
GFR calc non Af Amer: 48 mL/min/{1.73_m2} — ABNORMAL LOW (ref >59)
Globulin, Total: 3 g/dL (ref 1.5–4.5)
Glucose: 148 mg/dL — ABNORMAL HIGH (ref 65–99)
Potassium: 4.8 mmol/L (ref 3.5–5.2)
Sodium: 139 mmol/L (ref 134–144)
Total Protein: 7.3 g/dL (ref 6.0–8.5)

## 2019-07-03 LAB — LIPID PANEL
Chol/HDL Ratio: 3.8 ratio (ref 0.0–5.0)
Cholesterol, Total: 123 mg/dL (ref 100–199)
HDL: 32 mg/dL — ABNORMAL LOW (ref >39)
LDL Chol Calc (NIH): 33 mg/dL (ref 0–99)
Triglycerides: 402 mg/dL — ABNORMAL HIGH (ref 0–149)
VLDL Cholesterol Cal: 58 mg/dL — ABNORMAL HIGH (ref 5–40)

## 2019-07-06 ENCOUNTER — Encounter: Payer: Self-pay | Admitting: Cardiology

## 2019-07-06 NOTE — Assessment & Plan Note (Signed)
Now is back on Repatha.  Triglycerides are elevated.  We talked about dietary changes to avoid this.  May need to consider adding omega-3 fatty acids.

## 2019-07-06 NOTE — Assessment & Plan Note (Addendum)
He is due to have neck surgery in November.  This is sort of a surgery that has to happen because of broken screws and a stabilization hardware.  Not truly elective surgery, but it is relatively low risk from a cardiac standpoint.  He does have significant known CAD, however has been revascularized with relook cath showing patent grafts and stents.  No other revascularization options open.  He does have angina but seems to be controlled and not active.  PREOPERATIVE CARDIAC RISK ASSESSMENT   Revised Cardiac Risk Index:  High Risk Surgery: no; Neck Sgx  Defined as Intraperitoneal, intrathoracic or suprainguinal vascular  Active CAD: no; revascularized, and currently angina free on current meds.  CHF: yes; however stable  Cerebrovascular Disease: no;   Diabetes: no; On Insulin: no  CKD (Cr >~ 2): no; 1.53  Total: ~2 (but controlled) Estimated Risk of Adverse Outcome: Class III  Estimated Risk of MI, PE, VF/VT (Cardiac Arrest), Complete Heart Block: ~10 % -> however this risk is reduced to ~3-6% as he is on stable dose of beta-blocker, and his angina and heart failure is relatively stable. --> He is on adequate cardiac medication for protection.   ACC/AHA Guidelines for "Clearance":  Step 1 - Need for Emergency Surgery: No: Semi-elective  If Yes - go straight to OR with perioperative surveillance  Step 2 - Active Cardiac Conditions (Unstable Angina, Decompensated HF, Significant  Arrhytmias - Complete HB, Mobitz II, Symptomatic VT or SVT, Severe Aortic Stenosis - mean gradient > 40 mmHg, Valve area < 1.0 cm2):   No: No active angina (currently controlled) or decompensated heart failure  If Yes - Evaluate & Treat per ACC/AHA Guidelines  Step 3 -  Low Risk Surgery: Yes  If Yes --> proceed to OR  If No --> Step 4  Step 4 - Functional Capacity >= 4 METS without symptoms: Yes -> able to walk 1/2 miles a day  If Yes --> proceed to OR  If No --> Step 5  Step 5 --  Clinical  Risk Factors (CRF)   1-2 or more CRFs: Yes  If Yes -- assess Surgical Risk, --   (High Risk Non-cardiac), Intraabdominal or thoracic vascular surgery --> Proceed to OR, or consider testing if it will change management.  Intermediate Risk: Proceed to OR with HR control, or consider testing if it will change management  Low Risk: Proceed to OR --> further cardiovascular testing would not change current management as he is optimized  As of present, [name] is adequately managed from a cardiovascular standpoint.  Further testing would not change management.  He has been recently revascularized with relook catheterization showing stable grafts and stents.  Echocardiogram shows stable mildly reduced EF and his HFpEF is well controlled.   Would hold Effient 9 days prior to surgery -> restart 1 to 2 days postop if stable from bleeding standpoint.

## 2019-07-06 NOTE — Assessment & Plan Note (Signed)
Well-controlled with propranolol.  Hence the reason for being the preferred beta-blocker for him.

## 2019-07-06 NOTE — Assessment & Plan Note (Addendum)
Pretty stable CAD with relook cath in November 2019 showing patent grafts and stents.  Had some mild anginal symptoms which are probably exacerbated by diastolic dysfunction with some mild volume overload and obesity hypoventilation syndrome. For chest pain issues that were either angina or even potentially GI related, we added Imdur, and increase diltiazem.  Since doing this, he has not had any further symptoms.  Well controlled.  Plan:   Antianginal meds: Continue current dose of diltiazem (have been increased during last visit) along with Imdur and Inderal (preferred beta-blocker because of tremor) and max dose Ranexa  On furosemide for volume control (HFpEF/obesity hypoventilation)  Maintenance dose of Effient -> okay to hold for procedures (7-9 days preop)  Back on Repatha as he is statin intolerant

## 2019-07-06 NOTE — Assessment & Plan Note (Signed)
He does have some systolic dysfunction, but I think most of his symptoms are related to diastolic dysfunction (HFpEF-EF is 45%) and OHS. -> At baseline probably NYHA class II symptoms -> but pretty stable   We are using diltiazem for anti-angina and palpitation control which is acceptable for EF of roughly 45%).  This was added in view of ARB  Preferred beta-blockers Inderal because of tremors.  Did not tolerate switching to another beta-blocker.  On standing dose of Lasix which she is titrating for sliding scale and dyspnea/edema.

## 2019-07-06 NOTE — Assessment & Plan Note (Signed)
Chronic symptom.  Has improved now he is doing much better with walking.  Combination of deconditioning, obesity, diastolic dysfunction and OHS.  Not thought to be related to COPD.  More restricted lung disease from obesity.  Discussed importance of weight loss.  He probably will need to have dietary counseling once the COVID-19 restrictions are lifted.

## 2019-07-06 NOTE — Assessment & Plan Note (Signed)
Stable blood pressure.  We increased diltiazem last visit for additional antianginal benefit and blood pressure control.  This could be further increased if blood pressure continues to be borderline, but with questionable benefit.  Low threshold to restart ARB.

## 2019-07-06 NOTE — Assessment & Plan Note (Signed)
He now has multiple stents into the SVG-PDA as well as anastomotic SVG-RI-native RI.  Patent stents and grafts by most recent cath less than a year ago.  Plan lifelong Thienopyridine antiplatelet agent.  He is now stable on Effient having been switched from Brilinta due to breathing and bleeding issues. No longer on aspirin  FOR PREOPERATIVE EVALUATION: Okay to hold Effient 7-9 days prior to surgery/procedure depending on bleeding risk of procedure.

## 2019-07-06 NOTE — Assessment & Plan Note (Signed)
He is noncompliant after pulmonology consultation

## 2019-08-14 ENCOUNTER — Other Ambulatory Visit: Payer: Self-pay | Admitting: Pharmacist

## 2019-08-14 MED ORDER — REPATHA SURECLICK 140 MG/ML ~~LOC~~ SOAJ: 140.0000 mg | SUBCUTANEOUS | 3 refills | Status: AC

## 2019-09-11 ENCOUNTER — Other Ambulatory Visit: Payer: Self-pay | Admitting: Neurological Surgery

## 2019-09-12 ENCOUNTER — Telehealth: Payer: Self-pay

## 2019-09-12 NOTE — Telephone Encounter (Signed)
   Primary Cardiologist: [name], MD  Chart reviewed as part of pre-operative protocol coverage. Patient was contacted 09/12/2019 in reference to pre-operative risk assessment for pending surgery as outlined below.  [name] was last seen on 07/02/2019 by Dr. Ellyn Hack. He has a past medical history of CAD s/p multiple interventions and CABG. Patent stents and grafts by cardiac cath in 08/2018. Echo at that time with EF 45%. He has chronic shortness of breath which is improved recently with his increase in activity. He is walking about a mile day. He denies any chest discomfort or heart failure type symptoms.   [name] is a Class III risk with 6.6% risk of major cardiac event perioperatively (if CHF is counted which has not been a significant issue), however, he is quite stable with no ischemic or heart failure symptoms.   Dr. Ellyn Hack is [facility] with holding Effient if needed prior to surgery, 7-9 days depending on bleeding risk of procedure.   Therefore, based on ACC/AHA guidelines, the patient would be at acceptable risk for the planned procedure without further cardiovascular testing.   I will route this recommendation to the requesting party via Epic fax function and remove from pre-op pool.  Please call with questions.  Daune Perch, NP 09/12/2019, 10:13 AM

## 2019-09-12 NOTE — Telephone Encounter (Signed)
   [facility] Pre-operative Risk Assessment    Request for surgical clearance:  1. What type of surgery is being performed? C2 TO T2 POSTERIOR CERVICAL INSTRUMENTED FUSION  2. When is this surgery scheduled? 10/10/2019   3. What type of clearance is required (medical clearance vs. Pharmacy clearance to hold med vs. Both)? NOT MARKED  4. Are there any medications that need to be held prior to surgery and how long? NOT MARKED  5. Practice name and name of physician performing surgery? [facility]; DR. Marcello Moores OSTERGARD  6. What is your office phone number [phone]   7.   What is your office fax number [phone]  8.   Anesthesia type (None, local, MAC, general) ? GENERAL   Rodgerick Gilliand O Bettyjean Stefanski 09/12/2019, 8:16 AM  _________________________________________________________________   (provider comments below)

## 2019-09-12 NOTE — Telephone Encounter (Signed)
[name] has a past medical history of CAd s/p multiple interventions and CABG. Patent stents and grafts by cardiac cath in 08/2018. Echo at that time with EF 45%.   [name] is a Class III risk with 6.6% risk of major cardiac event periperatively. He is also obese with obesity hypoventilation syndrome and deconditioned.   He was last seen in the office by Dr. Ellyn Hack on 07/02/2019 and was stable. Dr. Ellyn Hack is [facility] with holding Effient if needed prior to surgery.   I have placed a call to pt to update status and if stable, will clear him with Moderate risk. I left VM for pt to call back and ask for preop clinic.

## 2019-09-25 ENCOUNTER — Other Ambulatory Visit: Payer: Self-pay | Admitting: Cardiology

## 2019-09-25 NOTE — Telephone Encounter (Signed)
Rx request sent to pharmacy.  

## 2019-10-02 ENCOUNTER — Other Ambulatory Visit: Payer: Self-pay

## 2019-10-02 MED ORDER — FUROSEMIDE 40 MG PO TABS: ORAL_TABLET | ORAL | 2 refills | Status: AC

## 2019-10-03 NOTE — Telephone Encounter (Signed)
error 

## 2019-10-08 ENCOUNTER — Other Ambulatory Visit (HOSPITAL_COMMUNITY)
Admission: RE | Admit: 2019-10-08 | Discharge: 2019-10-08 | Disposition: A | Discharge status: Home / Self Care | Payer: Medicare Other | Source: Ambulatory Visit | Attending: Neurological Surgery | Admitting: Neurological Surgery

## 2019-10-08 ENCOUNTER — Other Ambulatory Visit: Payer: Self-pay

## 2019-10-08 ENCOUNTER — Encounter (HOSPITAL_COMMUNITY): Payer: Self-pay

## 2019-10-08 ENCOUNTER — Encounter (HOSPITAL_COMMUNITY)
Admission: RE | Admit: 2019-10-08 | Discharge: 2019-10-08 | Disposition: A | Discharge status: Home / Self Care | Payer: Medicare Other | Source: Ambulatory Visit | Attending: Neurological Surgery | Admitting: Neurological Surgery

## 2019-10-08 DIAGNOSIS — I2510 Atherosclerotic heart disease of native coronary artery without angina pectoris: Secondary | ICD-10-CM | POA: Insufficient documentation

## 2019-10-08 DIAGNOSIS — Z955 Presence of coronary angioplasty implant and graft: Secondary | ICD-10-CM | POA: Insufficient documentation

## 2019-10-08 DIAGNOSIS — I272 Pulmonary hypertension, unspecified: Secondary | ICD-10-CM | POA: Insufficient documentation

## 2019-10-08 DIAGNOSIS — Z951 Presence of aortocoronary bypass graft: Secondary | ICD-10-CM | POA: Insufficient documentation

## 2019-10-08 DIAGNOSIS — I5043 Acute on chronic combined systolic (congestive) and diastolic (congestive) heart failure: Secondary | ICD-10-CM | POA: Insufficient documentation

## 2019-10-08 DIAGNOSIS — G4733 Obstructive sleep apnea (adult) (pediatric): Secondary | ICD-10-CM | POA: Insufficient documentation

## 2019-10-08 DIAGNOSIS — I130 Hypertensive heart and chronic kidney disease with heart failure and stage 1 through stage 4 chronic kidney disease, or unspecified chronic kidney disease: Secondary | ICD-10-CM | POA: Insufficient documentation

## 2019-10-08 DIAGNOSIS — N183 Chronic kidney disease, stage 3 unspecified: Secondary | ICD-10-CM | POA: Insufficient documentation

## 2019-10-08 LAB — BASIC METABOLIC PANEL
Anion gap: 11 (ref 5–15)
BUN: 17 mg/dL (ref 8–23)
CO2: 22 mmol/L (ref 22–32)
Calcium: 9.9 mg/dL (ref 8.9–10.3)
Chloride: 104 mmol/L (ref 98–111)
Creatinine, Ser: 1.73 mg/dL — ABNORMAL HIGH (ref 0.61–1.24)
GFR calc Af Amer: 48 mL/min — ABNORMAL LOW (ref >60)
GFR calc non Af Amer: 41 mL/min — ABNORMAL LOW (ref >60)
Glucose, Bld: 114 mg/dL — ABNORMAL HIGH (ref 70–99)
Potassium: 4.2 mmol/L (ref 3.5–5.1)
Sodium: 137 mmol/L (ref 135–145)

## 2019-10-08 LAB — CBC
HCT: 52.5 % — ABNORMAL HIGH (ref 39.0–52.0)
Hemoglobin: 17.4 g/dL — ABNORMAL HIGH (ref 13.0–17.0)
MCH: 28.2 pg (ref 26.0–34.0)
MCHC: 33.1 g/dL (ref 30.0–36.0)
MCV: 85.2 fL (ref 80.0–100.0)
Platelets: 278 10*3/uL (ref 150–400)
RBC: 6.16 MIL/uL — ABNORMAL HIGH (ref 4.22–5.81)
RDW: 14.2 % (ref 11.5–15.5)
WBC: 8.5 10*3/uL (ref 4.0–10.5)
nRBC: 0 % (ref 0.0–0.2)

## 2019-10-08 LAB — SURGICAL PCR SCREEN
MRSA, PCR: NEGATIVE
Staphylococcus aureus: NEGATIVE

## 2019-10-08 LAB — TYPE AND SCREEN
ABO/RH(D): B NEG
Antibody Screen: NEGATIVE

## 2019-10-08 LAB — SARS CORONAVIRUS 2 (TAT 6-24 HRS): SARS Coronavirus 2: NEGATIVE

## 2019-10-08 NOTE — Pre-Procedure Instructions (Signed)
Your procedure is scheduled on Wednesday, December 30th, from 12:30 PM- 4:24 PM.  Report to [facility]tr[facility] "A" at 10:30 A.M., and check in at the Admitting office.  Call this number if you have problems the morning of surgery:  [phone]  Call [phone] if you have any questions prior to your surgery date Monday-Friday 8am-4pm    Remember:  Do not eat or drink after midnight the night before your surgery.     Take these medicines the morning of surgery with A SIP OF WATER : allopurinol (ZYLOPRIM) buPROPion (WELLBUTRIN SR)  diltiazem (CARDIZEM CD) propranolol (INDERAL) ranolazine (RANEXA) neomycin-polymyxin-hydrocortisone (CORTISPORIN) ear drops  IF NEEDED: cyclobenzaprine (FLEXERIL) HYDROcodone-acetaminophen (NORCO)  nitroGLYCERIN (NITROSTAT)  As of today, STOP taking any Aspirin (unless otherwise instructed by your surgeon), Aleve, Naproxen, Ibuprofen, Motrin, Advil, Goody's, BC's, all herbal medications, fish oil, and all vitamins.    The Morning of Surgery  Do not wear jewelry.  Do not wear lotions, powders, colognes, or deodorant.  Men may shave face and neck.  Do not bring valuables to the hospital.  [facility] is not responsible for any belongings or valuables.  If you are a smoker, DO NOT Smoke 24 hours prior to surgery  If you wear a CPAP at night please bring your mask, tubing, and machine the morning of surgery   Remember that you must have someone to transport you home after your surgery, and remain with you for 24 hours if you are discharged the same day.   Please bring cases for contacts, glasses, hearing aids, dentures or bridgework because it cannot be worn into surgery.    Leave your suitcase in the car.  After surgery it may be brought to your room.  For patients admitted to the hospital, discharge time will be determined by your treatment team.  Patients discharged the day of surgery will not be allowed to drive home.     Special instructions:   [facility]- Preparing For Surgery  Before surgery, you can play an important role. Because skin is not sterile, your skin needs to be as free of germs as possible. You can reduce the number of germs on your skin by washing with CHG (chlorahexidine gluconate) Soap before surgery.  CHG is an antiseptic cleaner which kills germs and bonds with the skin to continue killing germs even after washing.    Oral Hygiene is also important to reduce your risk of infection.  Remember - BRUSH YOUR TEETH THE MORNING OF SURGERY WITH YOUR REGULAR TOOTHPASTE  Please do not use if you have an allergy to CHG or antibacterial soaps. If your skin becomes reddened/irritated stop using the CHG.  Do not shave (including legs and underarms) for at least 48 hours prior to first CHG shower. It is OK to shave your face.  Please follow these instructions carefully.   1. Shower the NIGHT BEFORE SURGERY and the MORNING OF SURGERY with CHG Soap.   2. If you chose to wash your hair, wash your hair first as usual with your normal shampoo.  3. After you shampoo, rinse your hair and body thoroughly to remove the shampoo.  4. Use CHG as you would any other liquid soap. You can apply CHG directly to the skin and wash gently with a scrungie or a clean washcloth.   5. Apply the CHG Soap to your body ONLY FROM THE NECK DOWN.  Do not use on open wounds or open sores. Avoid contact with your eyes,  ears, mouth and genitals (private parts). Wash Face and genitals (private parts)  with your normal soap.   6. Wash thoroughly, paying special attention to the area where your surgery will be performed.  7. Thoroughly rinse your body with warm water from the neck down.  8. DO NOT shower/wash with your normal soap after using and rinsing off the CHG Soap.  9. Pat yourself dry with a CLEAN TOWEL.  10. Wear CLEAN PAJAMAS to bed the night before surgery, wear comfortable clothes the morning of  surgery  11. Place CLEAN SHEETS on your bed the night of your first shower and DO NOT SLEEP WITH PETS.    Day of Surgery:  Please shower the morning of surgery with the CHG soap Do not apply any deodorants/lotions. Please wear clean clothes to the hospital/surgery center.   Remember to brush your teeth WITH YOUR REGULAR TOOTHPASTE.   Please read over the following fact sheets that you were given.

## 2019-10-08 NOTE — Progress Notes (Signed)
PCP - Meryle Ready, MD Cardiologist - [name], MD Urology- Alexis Frock, MD  PPM/ICD - Denies  Chest x-ray - 08/2019 @ [place] EKG - 07/02/2019 Stress Test - 2018 ECHO - 08/15/2018 Cardiac Cath - 08/14/2018  Sleep Study -Yes  CPAP - Yes  Patient denies being a diabetic  Blood Thinner Instructions: N/A Aspirin Instructions: N/A  ERAS Protcol - N/A PRE-SURGERY Ensure or G2-  N/A  COVID TEST- 10/08/2019  Anesthesia review: Yes, cardiac hx.  Patient denies shortness of breath, fever, cough and chest pain at PAT appointment  Coronavirus Screening  Have you experienced the following symptoms:  Cough yes/no: No Fever (>100.56F)  yes/no: No Runny nose yes/no: No Sore throat yes/no: No Difficulty breathing/shortness of breath  yes/no: No  Have you or a family member traveled in the last 14 days and where? yes/no: No   If the patient indicates "YES" to the above questions, their PAT will be rescheduled to limit the exposure to others and, the surgeon will be notified. THE PATIENT WILL NEED TO BE ASYMPTOMATIC FOR 14 DAYS.   If the patient is not experiencing any of these symptoms, the PAT nurse will instruct them to NOT bring anyone with them to their appointment since they may have these symptoms or traveled as well.   Please remind your patients and families that hospital visitation restrictions are in effect and the importance of the restrictions.    All instructions explained to the patient, with a verbal understanding of the material. Patient agrees to go over the instructions while at home for a better understanding. Patient also instructed to self quarantine after being tested for COVID-19. The opportunity to ask questions was provided.

## 2019-10-09 MED ORDER — DEXTROSE 5 % IV SOLN
3.0000 g | INTRAVENOUS | Status: AC
  Administered 2019-10-10 (×2): 3 g via INTRAVENOUS
  Filled 2019-10-09: qty 3
  Filled 2019-10-09: qty 3000

## 2019-10-09 NOTE — Anesthesia Preprocedure Evaluation (Addendum)
Anesthesia Evaluation  Patient identified by MRN, date of birth, ID band Patient awake    Reviewed: Allergy & Precautions, NPO status , Patient's Chart, lab work & pertinent test results  Airway Mallampati: III  TM Distance: >3 FB Neck ROM: Full    Dental  (+) Missing, Dental Advisory Given   Pulmonary shortness of breath, sleep apnea ,    Pulmonary exam normal breath sounds clear to auscultation       Cardiovascular hypertension, + angina + CAD, + Past MI, +CHF and + DOE  + pacemaker  Rhythm:Regular Rate:Normal     Neuro/Psych PSYCHIATRIC DISORDERS Depression negative neurological ROS     GI/Hepatic negative GI ROS, Neg liver ROS,   Endo/Other  negative endocrine ROS  Renal/GU Renal disease     Musculoskeletal  (+) Arthritis ,   Abdominal (+) + obese,   Peds  Hematology negative hematology ROS (+)   Anesthesia Other Findings   Reproductive/Obstetrics                                                            Anesthesia Evaluation  Patient identified by MRN, date of birth, ID band Patient awake    Reviewed: Allergy & Precautions, NPO status , Patient's Chart, lab work & pertinent test results, reviewed documented beta blocker date and time   Airway Mallampati: II  TM Distance: >3 FB Neck ROM: Full    Dental  (+) Chipped,    Pulmonary shortness of breath and with exertion, sleep apnea and Continuous Positive Airway Pressure Ventilation ,    Pulmonary exam normal breath sounds clear to auscultation       Cardiovascular hypertension, Pt. on medications and Pt. on home beta blockers + angina + CAD, + Past MI, + Cardiac Stents and + CABG  Normal cardiovascular exam Rhythm:Regular Rate:Normal  LHC 10/2017 Ost LAD to Prox LAD lesion is 90% stenosed. Prox LAD lesion is 100% stenosed. LIMA-LAD graft was visualized by angiography and is normal in caliber and large.  Mid-DISTAL LAD lesion is 70% stenosed. Beyond LIMA insertion Prox Cx to Mid Cx lesion is 100% stenosed. -At prior stent. Ost 2nd Mrg to 2nd Mrg lesion is 100% stenosed. -At prior stent. Dist Cx lesion is 85% stenosed. -Seen via collateral flow from SVG-RI SVG-OM2 graft was visualized by angiography. Origin lesion is 100% stenosed. Prox RCA to Dist RCA lesion is 100% stenosed. LESION #1 SVG graft-RPDA was visualized by angiography and is large. Origin to Mid Graft STENTED SEGMENT is 70% stenosed. In-stent restenosis Scoring balloon angioplasty was performed using a BALLOON WOLVERINE 3.50X15. Followed by post dilation using 3.75 mm balloon. Post intervention, there is a 20% residual stenosis. Mid Graft to Dist Graft lesion is 45% stenosed. -Distal to the stents in the SVG-RPDA LESION #2 Ost Ramus-1 lesion is 100% stenosed. Ost-PROX Ramus-2 lesion is 80% stenosed. SVG-RI was injected, large caliber. Insertion lesion is 80% stenosed. -- CONTINUES -- Ramus lesion is 90% stenosed. A drug-eluting stent was successfully placed using a STENT SYNERGY DES 2.5X20 -coursing from the SVG into the Ramus superior branch. Post intervention, there is a 0% residual stenosis. Severe progression of anastomotic disease in SVG-RI, severe disease in the superior branch of RI. Successful scoring balloon angioplasty of SVG-RPDA ISR, DES PCI of anastomotic SVG-RI into the  superior branch of RI.  TTE 2017 - Left ventricle: The cavity size was normal. Wall thickness was increased in a pattern of mild LVH. Systolic function was normal.The estimated ejection fraction was in the range of 55% to 60%. Wall motion was normal; there were no regional wall motion abnormalities. Features are consistent with a pseudonormal left ventricular filling pattern, with concomitant abnormal relaxation and increased filling pressure (grade 2 diastolic dysfunction).   Neuro/Psych PSYCHIATRIC DISORDERS Depression negative neurological  ROS     GI/Hepatic negative GI ROS, Neg liver ROS,   Endo/Other  negative endocrine ROS  Renal/GU Renal disease  negative genitourinary   Musculoskeletal  (+) Arthritis ,   Abdominal   Peds negative pediatric ROS (+)  Hematology negative hematology ROS (+)   Anesthesia Other Findings Right ureteral stone   on brilinta, last dose this morning  - CABG in 2002 with LIMA to-LAD, SVG-RI, SVG-OM, SVG-PDA               - bare-metal stent to SVG-PDA and SVG-OM in 2011 - pacemaker for bradycardia, extracted in 2016 - NSTEMI in September 2017, cardiac catheterization showed severe disease in the SVG to PDA treated with 2 drug-eluting stents.   -PTCA for in-stent restenosis of the SVG to PDA in January 2019, also DES to SVG-RI into diagonal branch.  Cleared by his cardiologist  Reproductive/Obstetrics negative OB ROS                         Anesthesia Physical Anesthesia Plan  ASA: IV  Anesthesia Plan: General   Post-op Pain Management:    Induction: Intravenous  PONV Risk Score and Plan: 2 and Treatment may vary due to age or medical condition  Airway Management Planned: LMA and Oral ETT  Additional Equipment:   Intra-op Plan:   Post-operative Plan: Extubation in OR  Informed Consent: I have reviewed the patients History and Physical, chart, labs and discussed the procedure including the risks, benefits and alternatives for the proposed anesthesia with the patient or authorized representative who has indicated [name] and acceptance.   Dental advisory given  Plan Discussed with: CRNA  Anesthesia Plan Comments:         Anesthesia Quick Evaluation  Anesthesia Physical Anesthesia Plan  ASA: IV  Anesthesia Plan: General   Post-op Pain Management:    Induction: Intravenous  PONV Risk Score and Plan: 3 and Ondansetron, Dexamethasone and Treatment may vary due to age or medical condition  Airway Management Planned:  Oral ETT and Video Laryngoscope Planned  Additional Equipment: Arterial line  Intra-op Plan:   Post-operative Plan: Extubation in OR  Informed Consent: I have reviewed the patients History and Physical, chart, labs and discussed the procedure including the risks, benefits and alternatives for the proposed anesthesia with the patient or authorized representative who has indicated [name] and acceptance.     Dental advisory given  Plan Discussed with: CRNA  Anesthesia Plan Comments: (Follows with cardiology for hx of CAD s/p CABG 2002 with multiple subsequent PCI. Last seen by Dr. Ellyn Hack on 07/06/19 and cleared at that time to hold effient for surgery. Cardiac clearance per telephone encounter 09/12/19 "Patient was contacted 09/12/2019 in reference to pre-operative risk assessment for pending surgery as outlined below.  [name] was last seen on 07/02/2019 by Dr. Ellyn Hack. He has a past medical history of CAD s/p multiple interventions and CABG. Patent stents and grafts by cardiac cath in 08/2018. Echo  at that time with EF 45%.He has chronic shortness of breath which is improved recently with his increase in activity. He is walking about a mile day. He denies any chest discomfort or heart failure type symptoms. [name] a Class III risk with 6.6% risk of major cardiac event perioperatively (if CHF is counted which has not been a significant issue), however, he is quite stable with no ischemic or heart failure symptoms. Dr. Ellyn Hack is [facility] with holding Effient if needed prior to surgery, 7-9 days depending on bleeding risk of procedure. Therefore, based on ACC/AHA guidelines, the patient would be at acceptable risk for the planned procedure without further cardiovascular testing."  Follows with pulmonology for OSA on CPAP and DOE with mild restriction on PFTs.  Hx of CKD III followed by PCP, review of labs shows baseline ~ 1.50-2.0. Creatinine 1.73 on preop labs. Labs  otherwise WNL.  EKG 07/02/19: NSR. Rate 69. Possible LAE. LAD. Inferior infarct, age undetermined. Cannot rule out anterior infarct, age undetermined.   TTE 08/15/18: - Left ventricle: The cavity size was normal. Systolic function was   normal. The estimated ejection fraction was 45%. Regional wall   motion abnormalities cannot be excluded due to image quality,   specifically, possible hypokinesis of the inferior wall from base   to apex. Doppler parameters are consistent with abnormal left   ventricular relaxation (grade 1 diastolic dysfunction). Doppler   parameters are consistent with high ventricular filling pressure   (E/e&' > 15). - Aortic valve: Trileaflet; normal thickness leaflets.   Transvalvular velocity was within the normal range. There was no   stenosis. There was no regurgitation. - Mitral valve: Calcified annulus. Transvalvular velocity was   within the normal range. There was no evidence for stenosis.   There was trivial regurgitation. - Left atrium: The atrium was mildly dilated by visual estimate. - Right ventricle: Poorly visualized. The cavity size was normal   when evaluated in limited acoustic windows. Wall thickness could   not be accurately determined. Systolic function was mildly   reduced when evaluated in limited acoustic windows. - Tricuspid valve: There was trivial regurgitation.  Impressions:  - Poor acoustic windows despite the use of Definity for left   ventricular enhancement. Estimated ejection fraction 45%.   Possible inferior regional wall motion abnormality, cannot   exclude other regional wall motion abnormalities. RV poorly   visualized. No hemodynamically significant valvular heart disease   seen.   Cath 08/14/18: SUMMARY Overall angiographically stable coronary disease with patent SVG-RCA, SVG-RI and LIMA-LAD.  Severe native disease.  Only mild progression in the ostial SVG-RCA lesion that was recently dilated on last  cath-PCI. Conflicting data with severely elevated LVEDP in the setting of systemic hypertension, that does not correlate with Wedge Pressure of 18 to 20 mmHg. 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  I suspect that his dyspnea is related to volume overload associated with acute on chronic combined systolic and diastolic heart failure.  This is likely exacerbated by the fact that he recently had urologic procedures with obstructive uropathy and renal insufficiency.  Several medications were held.  RECOMMENDATIONS Plan is to admit for short-term admission for diuresis.  I am leery of diuresing him in the outpatient setting given his recent renal insufficiency.  I would much rather monitor him closely.  Would anticipate a short stay after IV diuresis. Started with IV Lasix 40 mg twice daily -titrate accordingly.  (He says he may be 10 pounds up) Restart  amlodipine.  He is on propranolol for tremor as his beta-blocker (worked better than other beta-blocker).  May consider adding hydralazine depending on his blood pressures. Continue other home medications. Check 2D echocardiogram to confirm EF)      Anesthesia Quick Evaluation

## 2019-10-09 NOTE — Progress Notes (Signed)
Anesthesia Chart Review:  Follows with cardiology for hx of CAD s/p CABG 2002 with multiple subsequent PCI. Last seen by Dr. Ellyn Hack on 07/06/19 and cleared at that time to hold effient for surgery. Cardiac clearance per telephone encounter 09/12/19 "Patient was contacted 09/12/2019 in reference to pre-operative risk assessment for pending surgery as outlined below.  [name] was last seen on 07/02/2019 by Dr. Ellyn Hack. He has a past medical history of CAD s/p multiple interventions and CABG. Patent stents and grafts by cardiac cath in 08/2018. Echo at that time with EF 45%.He has chronic shortness of breath which is improved recently with his increase in activity. He is walking about a mile day. He denies any chest discomfort or heart failure type symptoms. [name] a Class III risk with 6.6% risk of major cardiac event perioperatively (if CHF is counted which has not been a significant issue), however, he is quite stable with no ischemic or heart failure symptoms. Dr. Ellyn Hack is [facility] with holding Effient if needed prior to surgery, 7-9 days depending on bleeding risk of procedure. Therefore, based on ACC/AHA guidelines, the patient would be at acceptable risk for the planned procedure without further cardiovascular testing."  Follows with pulmonology for OSA on CPAP and DOE with mild restriction on PFTs.  Hx of CKD III followed by PCP, review of labs shows baseline ~ 1.50-2.0. Creatinine 1.73 on preop labs. Labs otherwise WNL.  EKG 07/02/19: NSR. Rate 69. Possible LAE. LAD. Inferior infarct, age undetermined. Cannot rule out anterior infarct, age undetermined.   TTE 08/15/18: - Left ventricle: The cavity size was normal. Systolic function was   normal. The estimated ejection fraction was 45%. Regional wall   motion abnormalities cannot be excluded due to image quality,   specifically, possible hypokinesis of the inferior wall from base   to apex. Doppler parameters are consistent with  abnormal left   ventricular relaxation (grade 1 diastolic dysfunction). Doppler   parameters are consistent with high ventricular filling pressure   (E/e&' > 15). - Aortic valve: Trileaflet; normal thickness leaflets.   Transvalvular velocity was within the normal range. There was no   stenosis. There was no regurgitation. - Mitral valve: Calcified annulus. Transvalvular velocity was   within the normal range. There was no evidence for stenosis.   There was trivial regurgitation. - Left atrium: The atrium was mildly dilated by visual estimate. - Right ventricle: Poorly visualized. The cavity size was normal   when evaluated in limited acoustic windows. Wall thickness could   not be accurately determined. Systolic function was mildly   reduced when evaluated in limited acoustic windows. - Tricuspid valve: There was trivial regurgitation.  Impressions:  - Poor acoustic windows despite the use of Definity for left   ventricular enhancement. Estimated ejection fraction 45%.   Possible inferior regional wall motion abnormality, cannot   exclude other regional wall motion abnormalities. RV poorly   visualized. No hemodynamically significant valvular heart disease   seen.   Cath 08/14/18: SUMMARY  Overall angiographically stable coronary disease with patent SVG-RCA, SVG-RI and LIMA-LAD.  Severe native disease.  Only mild progression in the ostial SVG-RCA lesion that was recently dilated on last cath-PCI.  Conflicting data with severely elevated LVEDP in the setting of systemic hypertension, that does not correlate with Wedge Pressure of 18 to 20 mmHg.  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  I suspect that his dyspnea is related to volume overload associated with acute on chronic combined  systolic and diastolic heart failure.  This is likely exacerbated by the fact that he recently had urologic procedures with obstructive uropathy and renal insufficiency.   Several medications were held.  RECOMMENDATIONS  Plan is to admit for short-term admission for diuresis.  I am leery of diuresing him in the outpatient setting given his recent renal insufficiency.  I would much rather monitor him closely.  Would anticipate a short stay after IV diuresis.  Started with IV Lasix 40 mg twice daily -titrate accordingly.  (He says he may be 10 pounds up)  Restart amlodipine.  He is on propranolol for tremor as his beta-blocker (worked better than other beta-blocker).  May consider adding hydralazine depending on his blood pressures.  Continue other home medications.  Check 2D echocardiogram to confirm EF   [name] [facility] Short Stay Center/Anesthesiology Phone [phone] 10/09/2019 10:26 AM

## 2019-10-10 ENCOUNTER — Inpatient Hospital Stay (HOSPITAL_COMMUNITY)
Admission: RE | Admit: 2019-10-10 | Discharge: 2019-10-12 | DRG: 459 | Disposition: A | Discharge status: Home / Self Care | Payer: Medicare Other | Attending: Neurological Surgery | Admitting: Neurological Surgery

## 2019-10-10 ENCOUNTER — Encounter (HOSPITAL_COMMUNITY)
Admission: RE | Disposition: A | Discharge status: Home / Self Care | Payer: Self-pay | Source: Home / Self Care | Attending: Neurological Surgery

## 2019-10-10 ENCOUNTER — Inpatient Hospital Stay (HOSPITAL_COMMUNITY): Payer: Medicare Other

## 2019-10-10 ENCOUNTER — Other Ambulatory Visit: Payer: Self-pay

## 2019-10-10 ENCOUNTER — Encounter (HOSPITAL_COMMUNITY): Payer: Self-pay | Admitting: Neurological Surgery

## 2019-10-10 ENCOUNTER — Inpatient Hospital Stay (HOSPITAL_COMMUNITY): Payer: Medicare Other | Admitting: Physician Assistant

## 2019-10-10 ENCOUNTER — Inpatient Hospital Stay (HOSPITAL_COMMUNITY): Payer: Medicare Other | Admitting: Certified Registered Nurse Anesthetist

## 2019-10-10 DIAGNOSIS — I25810 Atherosclerosis of coronary artery bypass graft(s) without angina pectoris: Secondary | ICD-10-CM | POA: Diagnosis present

## 2019-10-10 DIAGNOSIS — Z79899 Other long term (current) drug therapy: Secondary | ICD-10-CM | POA: Diagnosis not present

## 2019-10-10 DIAGNOSIS — Z20822 Contact with and (suspected) exposure to covid-19: Secondary | ICD-10-CM | POA: Diagnosis present

## 2019-10-10 DIAGNOSIS — G4733 Obstructive sleep apnea (adult) (pediatric): Secondary | ICD-10-CM | POA: Diagnosis present

## 2019-10-10 DIAGNOSIS — G250 Essential tremor: Secondary | ICD-10-CM | POA: Diagnosis present

## 2019-10-10 DIAGNOSIS — F329 Major depressive disorder, single episode, unspecified: Secondary | ICD-10-CM | POA: Diagnosis present

## 2019-10-10 DIAGNOSIS — Z955 Presence of coronary angioplasty implant and graft: Secondary | ICD-10-CM | POA: Diagnosis not present

## 2019-10-10 DIAGNOSIS — N189 Chronic kidney disease, unspecified: Secondary | ICD-10-CM | POA: Diagnosis present

## 2019-10-10 DIAGNOSIS — Z833 Family history of diabetes mellitus: Secondary | ICD-10-CM

## 2019-10-10 DIAGNOSIS — S129XXA Fracture of neck, unspecified, initial encounter: Secondary | ICD-10-CM | POA: Diagnosis present

## 2019-10-10 DIAGNOSIS — M960 Pseudarthrosis after fusion or arthrodesis: Principal | ICD-10-CM | POA: Diagnosis present

## 2019-10-10 DIAGNOSIS — I130 Hypertensive heart and chronic kidney disease with heart failure and stage 1 through stage 4 chronic kidney disease, or unspecified chronic kidney disease: Secondary | ICD-10-CM | POA: Diagnosis present

## 2019-10-10 DIAGNOSIS — Z8249 Family history of ischemic heart disease and other diseases of the circulatory system: Secondary | ICD-10-CM | POA: Diagnosis not present

## 2019-10-10 DIAGNOSIS — I2510 Atherosclerotic heart disease of native coronary artery without angina pectoris: Secondary | ICD-10-CM | POA: Diagnosis present

## 2019-10-10 DIAGNOSIS — N183 Chronic kidney disease, stage 3 unspecified: Secondary | ICD-10-CM | POA: Diagnosis present

## 2019-10-10 DIAGNOSIS — I5043 Acute on chronic combined systolic (congestive) and diastolic (congestive) heart failure: Secondary | ICD-10-CM | POA: Diagnosis present

## 2019-10-10 DIAGNOSIS — M4802 Spinal stenosis, cervical region: Secondary | ICD-10-CM | POA: Diagnosis present

## 2019-10-10 DIAGNOSIS — Y838 Other surgical procedures as the cause of abnormal reaction of the patient, or of later complication, without mention of misadventure at the time of the procedure: Secondary | ICD-10-CM | POA: Diagnosis present

## 2019-10-10 DIAGNOSIS — I252 Old myocardial infarction: Secondary | ICD-10-CM

## 2019-10-10 DIAGNOSIS — Z8349 Family history of other endocrine, nutritional and metabolic diseases: Secondary | ICD-10-CM | POA: Diagnosis not present

## 2019-10-10 HISTORY — PX: APPLICATION OF INTRAOPERATIVE CT SCAN: SHX6668

## 2019-10-10 HISTORY — PX: POSTERIOR CERVICAL FUSION/FORAMINOTOMY: SHX5038

## 2019-10-10 LAB — POCT I-STAT 7, (LYTES, BLD GAS, ICA,H+H)
Acid-base deficit: 5 mmol/L — ABNORMAL HIGH (ref 0.0–2.0)
Bicarbonate: 22 mmol/L (ref 20.0–28.0)
Calcium, Ion: 1.15 mmol/L (ref 1.15–1.40)
HCT: 41 % (ref 39.0–52.0)
Hemoglobin: 13.9 g/dL (ref 13.0–17.0)
O2 Saturation: 99 %
Patient temperature: 36.6
Potassium: 5.7 mmol/L — ABNORMAL HIGH (ref 3.5–5.1)
Sodium: 137 mmol/L (ref 135–145)
TCO2: 23 mmol/L (ref 22–32)
pCO2 arterial: 45 mmHg (ref 32.0–48.0)
pH, Arterial: 7.296 — ABNORMAL LOW (ref 7.350–7.450)
pO2, Arterial: 141 mmHg — ABNORMAL HIGH (ref 83.0–108.0)

## 2019-10-10 SURGERY — POSTERIOR CERVICAL FUSION/FORAMINOTOMY LEVEL 5
Anesthesia: General | Site: Spine Cervical

## 2019-10-10 MED ORDER — BACITRACIN ZINC 500 UNIT/GM EX OINT
TOPICAL_OINTMENT | CUTANEOUS | Status: DC | PRN
  Administered 2019-10-10: 1 via TOPICAL

## 2019-10-10 MED ORDER — DILTIAZEM HCL ER COATED BEADS 240 MG PO CP24
240.0000 mg | ORAL_CAPSULE | Freq: Every day | ORAL | Status: DC
  Administered 2019-10-10 – 2019-10-12 (×3): 240 mg via ORAL
  Filled 2019-10-10 (×3): qty 1

## 2019-10-10 MED ORDER — DOCUSATE SODIUM 100 MG PO CAPS
100.0000 mg | ORAL_CAPSULE | Freq: Two times a day (BID) | ORAL | Status: DC
  Administered 2019-10-10 – 2019-10-12 (×4): 100 mg via ORAL
  Filled 2019-10-10 (×4): qty 1

## 2019-10-10 MED ORDER — ALBUMIN HUMAN 5 % IV SOLN: INTRAVENOUS | Status: DC | PRN

## 2019-10-10 MED ORDER — SODIUM CHLORIDE 0.9 % IV SOLN
250.0000 mL | INTRAVENOUS | Status: DC
  Administered 2019-10-10: 250 mL via INTRAVENOUS

## 2019-10-10 MED ORDER — MIDAZOLAM HCL 2 MG/2ML IJ SOLN
INTRAMUSCULAR | Status: DC | PRN
  Administered 2019-10-10: 2 mg via INTRAVENOUS

## 2019-10-10 MED ORDER — SODIUM CHLORIDE 0.9 % IV SOLN: INTRAVENOUS | Status: DC | PRN

## 2019-10-10 MED ORDER — PHENYLEPHRINE 40 MCG/ML (10ML) SYRINGE FOR IV PUSH (FOR BLOOD PRESSURE SUPPORT)
PREFILLED_SYRINGE | INTRAVENOUS | Status: DC | PRN
  Administered 2019-10-10: 40 ug via INTRAVENOUS
  Administered 2019-10-10: 120 ug via INTRAVENOUS
  Administered 2019-10-10: 80 ug via INTRAVENOUS

## 2019-10-10 MED ORDER — SODIUM CHLORIDE 0.9% FLUSH
3.0000 mL | Freq: Two times a day (BID) | INTRAVENOUS | Status: DC
  Administered 2019-10-10 – 2019-10-12 (×4): 3 mL via INTRAVENOUS

## 2019-10-10 MED ORDER — FENTANYL CITRATE (PF) 250 MCG/5ML IJ SOLN
INTRAMUSCULAR | Status: AC
  Filled 2019-10-10: qty 5

## 2019-10-10 MED ORDER — CEFAZOLIN SODIUM-DEXTROSE 2-4 GM/100ML-% IV SOLN
2.0000 g | Freq: Three times a day (TID) | INTRAVENOUS | Status: AC
  Administered 2019-10-10 – 2019-10-11 (×2): 2 g via INTRAVENOUS
  Filled 2019-10-10 (×3): qty 100

## 2019-10-10 MED ORDER — PHENYLEPHRINE HCL-NACL 10-0.9 MG/250ML-% IV SOLN
INTRAVENOUS | Status: DC | PRN
  Administered 2019-10-10: 20 ug/min via INTRAVENOUS

## 2019-10-10 MED ORDER — ROCURONIUM BROMIDE 10 MG/ML (PF) SYRINGE
PREFILLED_SYRINGE | INTRAVENOUS | Status: DC | PRN
  Administered 2019-10-10 (×2): 20 mg via INTRAVENOUS
  Administered 2019-10-10: 10 mg via INTRAVENOUS
  Administered 2019-10-10: 100 mg via INTRAVENOUS
  Administered 2019-10-10: 20 mg via INTRAVENOUS

## 2019-10-10 MED ORDER — LACTATED RINGERS IV SOLN: INTRAVENOUS | Status: DC | PRN

## 2019-10-10 MED ORDER — CYCLOBENZAPRINE HCL 10 MG PO TABS: 10.0000 mg | ORAL_TABLET | Freq: Three times a day (TID) | ORAL | Status: DC | PRN

## 2019-10-10 MED ORDER — HYDROMORPHONE HCL 1 MG/ML IJ SOLN
1.0000 mg | INTRAMUSCULAR | Status: DC | PRN
  Administered 2019-10-10 – 2019-10-11 (×6): 1 mg via INTRAVENOUS
  Filled 2019-10-10 (×6): qty 1

## 2019-10-10 MED ORDER — FUROSEMIDE 40 MG PO TABS
40.0000 mg | ORAL_TABLET | Freq: Every day | ORAL | Status: DC
  Administered 2019-10-10 – 2019-10-12 (×3): 40 mg via ORAL
  Filled 2019-10-10 (×3): qty 1

## 2019-10-10 MED ORDER — DEXAMETHASONE SODIUM PHOSPHATE 10 MG/ML IJ SOLN
INTRAMUSCULAR | Status: DC | PRN
  Administered 2019-10-10: 10 mg via INTRAVENOUS

## 2019-10-10 MED ORDER — PROPOFOL 10 MG/ML IV BOLUS
INTRAVENOUS | Status: AC
  Filled 2019-10-10: qty 20

## 2019-10-10 MED ORDER — BUPROPION HCL ER (SR) 150 MG PO TB12
150.0000 mg | ORAL_TABLET | Freq: Two times a day (BID) | ORAL | Status: DC
  Administered 2019-10-10 – 2019-10-12 (×4): 150 mg via ORAL
  Filled 2019-10-10 (×4): qty 1

## 2019-10-10 MED ORDER — ONDANSETRON HCL 4 MG PO TABS: 4.0000 mg | ORAL_TABLET | Freq: Four times a day (QID) | ORAL | Status: DC | PRN

## 2019-10-10 MED ORDER — ONDANSETRON HCL 4 MG/2ML IJ SOLN: 4.0000 mg | Freq: Four times a day (QID) | INTRAMUSCULAR | Status: DC | PRN

## 2019-10-10 MED ORDER — CLONAZEPAM 0.5 MG PO TABS
2.0000 mg | ORAL_TABLET | Freq: Every day | ORAL | Status: DC
  Administered 2019-10-10 – 2019-10-11 (×2): 2 mg via ORAL
  Filled 2019-10-10 (×2): qty 4

## 2019-10-10 MED ORDER — MEPERIDINE HCL 25 MG/ML IJ SOLN: 6.2500 mg | INTRAMUSCULAR | Status: DC | PRN

## 2019-10-10 MED ORDER — LIDOCAINE 2% (20 MG/ML) 5 ML SYRINGE
INTRAMUSCULAR | Status: DC | PRN
  Administered 2019-10-10: 100 mg via INTRAVENOUS

## 2019-10-10 MED ORDER — HYDROMORPHONE HCL 1 MG/ML IJ SOLN: 0.2500 mg | INTRAMUSCULAR | Status: DC | PRN

## 2019-10-10 MED ORDER — CHLORHEXIDINE GLUCONATE CLOTH 2 % EX PADS: 6.0000 | MEDICATED_PAD | Freq: Once | CUTANEOUS | Status: DC

## 2019-10-10 MED ORDER — PROPOFOL 10 MG/ML IV BOLUS
INTRAVENOUS | Status: AC
  Filled 2019-10-10: qty 40

## 2019-10-10 MED ORDER — HYDROMORPHONE HCL 1 MG/ML IJ SOLN
INTRAMUSCULAR | Status: DC | PRN
  Administered 2019-10-10: .5 mg via INTRAVENOUS

## 2019-10-10 MED ORDER — POLYETHYLENE GLYCOL 3350 17 G PO PACK: 17.0000 g | PACK | Freq: Every day | ORAL | Status: DC | PRN

## 2019-10-10 MED ORDER — ALLOPURINOL 100 MG PO TABS
300.0000 mg | ORAL_TABLET | Freq: Every day | ORAL | Status: DC
  Administered 2019-10-10 – 2019-10-12 (×3): 300 mg via ORAL
  Filled 2019-10-10 (×3): qty 3

## 2019-10-10 MED ORDER — MIDAZOLAM HCL 2 MG/2ML IJ SOLN
INTRAMUSCULAR | Status: AC
  Filled 2019-10-10: qty 2

## 2019-10-10 MED ORDER — SUCCINYLCHOLINE CHLORIDE 200 MG/10ML IV SOSY
PREFILLED_SYRINGE | INTRAVENOUS | Status: DC | PRN
  Administered 2019-10-10: 180 mg via INTRAVENOUS

## 2019-10-10 MED ORDER — ACETAMINOPHEN 650 MG RE SUPP: 650.0000 mg | RECTAL | Status: DC | PRN

## 2019-10-10 MED ORDER — ARTIFICIAL TEARS OPHTHALMIC OINT
TOPICAL_OINTMENT | OPHTHALMIC | Status: DC | PRN
  Administered 2019-10-10: 1 via OPHTHALMIC

## 2019-10-10 MED ORDER — ACETAMINOPHEN 325 MG PO TABS: 650.0000 mg | ORAL_TABLET | ORAL | Status: DC | PRN

## 2019-10-10 MED ORDER — HYDRALAZINE HCL 20 MG/ML IJ SOLN
INTRAMUSCULAR | Status: AC
  Filled 2019-10-10: qty 1

## 2019-10-10 MED ORDER — MENTHOL 3 MG MT LOZG: 1.0000 | LOZENGE | OROMUCOSAL | Status: DC | PRN

## 2019-10-10 MED ORDER — LIDOCAINE-EPINEPHRINE 1 %-1:100000 IJ SOLN
INTRAMUSCULAR | Status: DC | PRN
  Administered 2019-10-10: 7 mL

## 2019-10-10 MED ORDER — PROMETHAZINE HCL 25 MG/ML IJ SOLN: 6.2500 mg | INTRAMUSCULAR | Status: DC | PRN

## 2019-10-10 MED ORDER — THROMBIN 5000 UNITS EX SOLR
CUTANEOUS | Status: AC
  Filled 2019-10-10: qty 10000

## 2019-10-10 MED ORDER — SUGAMMADEX SODIUM 200 MG/2ML IV SOLN
INTRAVENOUS | Status: DC | PRN
  Administered 2019-10-10: 300 mg via INTRAVENOUS

## 2019-10-10 MED ORDER — HYDRALAZINE HCL 20 MG/ML IJ SOLN
10.0000 mg | Freq: Once | INTRAMUSCULAR | Status: AC
  Administered 2019-10-10: 10 mg via INTRAVENOUS

## 2019-10-10 MED ORDER — BACITRACIN ZINC 500 UNIT/GM EX OINT
TOPICAL_OINTMENT | CUTANEOUS | Status: AC
  Filled 2019-10-10: qty 28.35

## 2019-10-10 MED ORDER — CEFAZOLIN SODIUM 1 G IJ SOLR
INTRAMUSCULAR | Status: AC
  Filled 2019-10-10: qty 30

## 2019-10-10 MED ORDER — RANOLAZINE ER 500 MG PO TB12
500.0000 mg | ORAL_TABLET | Freq: Two times a day (BID) | ORAL | Status: DC
  Administered 2019-10-10 – 2019-10-12 (×4): 500 mg via ORAL
  Filled 2019-10-10 (×4): qty 1

## 2019-10-10 MED ORDER — LACTATED RINGERS IV SOLN: INTRAVENOUS | Status: DC

## 2019-10-10 MED ORDER — PROPRANOLOL HCL 20 MG PO TABS
20.0000 mg | ORAL_TABLET | Freq: Three times a day (TID) | ORAL | Status: DC
  Administered 2019-10-10 – 2019-10-12 (×5): 20 mg via ORAL
  Filled 2019-10-10 (×8): qty 1

## 2019-10-10 MED ORDER — 0.9 % SODIUM CHLORIDE (POUR BTL) OPTIME
TOPICAL | Status: DC | PRN
  Administered 2019-10-10: 1000 mL

## 2019-10-10 MED ORDER — LIDOCAINE-EPINEPHRINE 1 %-1:100000 IJ SOLN
INTRAMUSCULAR | Status: AC
  Filled 2019-10-10: qty 1

## 2019-10-10 MED ORDER — BACLOFEN 10 MG PO TABS
10.0000 mg | ORAL_TABLET | Freq: Every day | ORAL | Status: DC
  Administered 2019-10-10 – 2019-10-11 (×2): 10 mg via ORAL
  Filled 2019-10-10 (×2): qty 1

## 2019-10-10 MED ORDER — HEPARIN SODIUM (PORCINE) 5000 UNIT/ML IJ SOLN
5000.0000 [IU] | Freq: Three times a day (TID) | INTRAMUSCULAR | Status: DC
  Administered 2019-10-12: 5000 [IU] via SUBCUTANEOUS
  Filled 2019-10-10: qty 1

## 2019-10-10 MED ORDER — FENTANYL CITRATE (PF) 250 MCG/5ML IJ SOLN
INTRAMUSCULAR | Status: DC | PRN
  Administered 2019-10-10 (×2): 50 ug via INTRAVENOUS
  Administered 2019-10-10 (×2): 25 ug via INTRAVENOUS
  Administered 2019-10-10: 50 ug via INTRAVENOUS
  Administered 2019-10-10: 100 ug via INTRAVENOUS
  Administered 2019-10-10 (×2): 50 ug via INTRAVENOUS
  Administered 2019-10-10: 150 ug via INTRAVENOUS
  Administered 2019-10-10 (×4): 50 ug via INTRAVENOUS

## 2019-10-10 MED ORDER — NITROGLYCERIN 0.4 MG SL SUBL: 0.4000 mg | SUBLINGUAL_TABLET | SUBLINGUAL | Status: DC | PRN

## 2019-10-10 MED ORDER — SODIUM CHLORIDE 0.9% FLUSH: 3.0000 mL | INTRAVENOUS | Status: DC | PRN

## 2019-10-10 MED ORDER — THROMBIN 5000 UNITS EX SOLR: OROMUCOSAL | Status: DC | PRN

## 2019-10-10 MED ORDER — HYDROMORPHONE HCL 1 MG/ML IJ SOLN
INTRAMUSCULAR | Status: AC
  Filled 2019-10-10: qty 0.5

## 2019-10-10 MED ORDER — PHENOL 1.4 % MT LIQD: 1.0000 | OROMUCOSAL | Status: DC | PRN

## 2019-10-10 MED ORDER — ONDANSETRON HCL 4 MG/2ML IJ SOLN
INTRAMUSCULAR | Status: DC | PRN
  Administered 2019-10-10: 4 mg via INTRAVENOUS

## 2019-10-10 MED ORDER — PROPOFOL 10 MG/ML IV BOLUS
INTRAVENOUS | Status: DC | PRN
  Administered 2019-10-10: 50 mg via INTRAVENOUS
  Administered 2019-10-10: 250 mg via INTRAVENOUS

## 2019-10-10 MED ORDER — OXYCODONE HCL 5 MG PO TABS
5.0000 mg | ORAL_TABLET | ORAL | Status: DC | PRN
  Administered 2019-10-10: 5 mg via ORAL
  Filled 2019-10-10: qty 1

## 2019-10-10 MED ORDER — OXYCODONE HCL 5 MG PO TABS
10.0000 mg | ORAL_TABLET | ORAL | Status: DC | PRN
  Administered 2019-10-11 – 2019-10-12 (×5): 10 mg via ORAL
  Filled 2019-10-10 (×5): qty 2

## 2019-10-10 SURGICAL SUPPLY — 60 items
ADH SKN CLS APL DERMABOND .7 (GAUZE/BANDAGES/DRESSINGS) ×2
BAG DECANTER FOR FLEXI CONT (MISCELLANEOUS) ×4 IMPLANT
BIT DRILL 2.4 (BIT) ×1 IMPLANT
BIT DRILL 2.4MM (BIT) ×1
BIT DRILL NAVIGATED 3606010 (BIT) ×2 IMPLANT
BLADE CLIPPER SURG (BLADE) ×4 IMPLANT
BUR MATCHSTICK NEURO 3.0 LAGG (BURR) ×2 IMPLANT
BUR PRECISION FLUTE 5.0 (BURR) ×4 IMPLANT
CANISTER SUCT 3000ML PPV (MISCELLANEOUS) ×8 IMPLANT
COVER BACK TABLE 60X90IN (DRAPES) ×10 IMPLANT
DECANTER SPIKE VIAL GLASS SM (MISCELLANEOUS) ×4 IMPLANT
DERMABOND ADVANCED (GAUZE/BANDAGES/DRESSINGS) ×2
DERMABOND ADVANCED .7 DNX12 (GAUZE/BANDAGES/DRESSINGS) ×2 IMPLANT
DRAPE C-ARM 42X72 X-RAY (DRAPES) ×8 IMPLANT
DRAPE LAPAROTOMY 100X72 PEDS (DRAPES) ×4 IMPLANT
DRAPE SCAN PATIENT (DRAPES) ×4 IMPLANT
DURAPREP 6ML APPLICATOR 50/CS (WOUND CARE) ×4 IMPLANT
ELECT REM PT RETURN 9FT ADLT (ELECTROSURGICAL) ×4
ELECTRODE REM PT RTRN 9FT ADLT (ELECTROSURGICAL) ×2 IMPLANT
GAUZE 4X4 16PLY RFD (DISPOSABLE) ×2 IMPLANT
GLOVE BIO SURGEON STRL SZ7.5 (GLOVE) ×6 IMPLANT
GLOVE BIOGEL PI IND STRL 7.0 (GLOVE) IMPLANT
GLOVE BIOGEL PI IND STRL 7.5 (GLOVE) ×2 IMPLANT
GLOVE BIOGEL PI INDICATOR 7.0 (GLOVE) ×6
GLOVE BIOGEL PI INDICATOR 7.5 (GLOVE) ×10
GLOVE SS N UNI LF 6.5 STRL (GLOVE) ×4 IMPLANT
GLOVE SS N UNI LF 7.0 STRL (GLOVE) ×4 IMPLANT
GLOVE SS N UNI LF 7.5 STRL (GLOVE) ×4 IMPLANT
GOWN STRL REUS W/ TWL LRG LVL3 (GOWN DISPOSABLE) IMPLANT
GOWN STRL REUS W/ TWL XL LVL3 (GOWN DISPOSABLE) ×2 IMPLANT
GOWN STRL REUS W/TWL 2XL LVL3 (GOWN DISPOSABLE) IMPLANT
GOWN STRL REUS W/TWL LRG LVL3 (GOWN DISPOSABLE) ×8
GOWN STRL REUS W/TWL XL LVL3 (GOWN DISPOSABLE) ×12
GRAFT BN 10X1XDBM MAGNIFUSE (Bone Implant) IMPLANT
GRAFT BONE MAGNIFUSE 1X10CM (Bone Implant) ×4 IMPLANT
HEMOSTAT POWDER KIT SURGIFOAM (HEMOSTASIS) ×4 IMPLANT
KIT BASIN OR (CUSTOM PROCEDURE TRAY) ×4 IMPLANT
KIT TURNOVER KIT B (KITS) ×4 IMPLANT
MARKER SPHERE PSV REFLC 13MM (MARKER) ×22 IMPLANT
NEEDLE HYPO 22GX1.5 SAFETY (NEEDLE) ×4 IMPLANT
NS IRRIG 1000ML POUR BTL (IV SOLUTION) ×4 IMPLANT
PACK LAMINECTOMY NEURO (CUSTOM PROCEDURE TRAY) ×4 IMPLANT
PIN MAYFIELD SKULL DISP (PIN) ×4 IMPLANT
ROD STD 3.5X240 (Rod) ×4 IMPLANT
SCREW MA INFINITY 3.5X12 (Screw) ×4 IMPLANT
SCREW MULTI AXIAL 3.5X14MM (Screw) ×4 IMPLANT
SCREW MULTI AXIAL 4.5 X 12 (Screw) ×4 IMPLANT
SCREW MULTI AXIAL 4.5X24 (Screw) ×8 IMPLANT
SCREW SET 3600315 STANDARD (Screw) ×40 IMPLANT
SCREW SET 3600315 STD (Screw) IMPLANT
SPONGE LAP 4X18 RFD (DISPOSABLE) IMPLANT
STAPLER VISISTAT 35W (STAPLE) ×4 IMPLANT
SUT ETHILON 3 0 FSL (SUTURE) IMPLANT
SUT MNCRL AB 3-0 PS2 18 (SUTURE) ×4 IMPLANT
SUT VIC AB 0 CT1 18XCR BRD8 (SUTURE) ×2 IMPLANT
SUT VIC AB 0 CT1 8-18 (SUTURE) ×8
SUT VIC AB 2-0 CP2 18 (SUTURE) ×8 IMPLANT
TOWEL GREEN STERILE (TOWEL DISPOSABLE) ×4 IMPLANT
TOWEL GREEN STERILE FF (TOWEL DISPOSABLE) ×4 IMPLANT
WATER STERILE IRR 1000ML POUR (IV SOLUTION) ×4 IMPLANT

## 2019-10-10 NOTE — Op Note (Signed)
PATIENT: [name] OF SURGERY: 10/10/19   PRE-OPERATIVE DIAGNOSIS:  Cervical pseudoarthrosis, cervical stenosis   POST-OPERATIVE DIAGNOSIS:  Same   PROCEDURE:  Revision and extension of posterior cervical instrumented fusion from C2-T2, C4 and C5 laminectomies, bilateral C7-T1 foraminotomies   SURGEON:  Surgeon(s) and Role:    Judith Part, MD - Primary    Erline Levine, MD - Assisting   ANESTHESIA: ETGA   BRIEF HISTORY: This is a 63 year old man who presented with bilateral hand numbness with severe neck pain. He previously had an ACDF with pseudoarthrosis that was backed up with a posterior cervical instrumented fusion. He now has pseudoarthroses with his anterior plate/screws backing out inferiorly and C3 lateral mass screw loosening along with central canal and foraminal stenosis. This was discussed with the patient as well as risks, benefits, and alternatives and wished to proceed with surgery.   OPERATIVE DETAIL: The patient was taken to the operating room and placed on the OR table in the prone position. A formal time out was performed with two patient identifiers and confirmed the operative site. Anesthesia was induced by the anesthesia team. The operative site was marked, hair was clipped with surgical clippers, the area was then prepped and draped in a sterile fashion.   His midline cervical scar was used for his new incision and extended cranially and caudally. Subperiosteal dissection was performed to expose the posterior elements of C2-T2. The bilateral rods were removed and the loose C3 screws were removed to reduce artifact. A spinous process clamp was placed and an intra-op CT was obtained for stereotactic guidance of the C2, T1, and T2 screws. The CT was registered using the array and the fit appeared to be acceptable when checking with landmarks.  Using stereotactic guidance, bilateral C2 pars screws were placed by drilling a pilot hole then using a  navigated hand drill. They were then palpated, tapped, and screws were placed. Pedicle screws were then placed at T1 and T2 in the same fashion. The C3 lateral mass screws had already been removed and were replaced with 4.22mm rescue screws and tested for pull-out strength. The right felt like it threaded well, but the left easily came out and therefore the screw was not replaced.   Lateral mass screws were then placed at C6 bilaterally using standard landmarks. Decompression was then performed, which consisted of laminectomies at C4 and C5 followed by bilateral foraminotomies at C7-T1 using a combination of high speed drill and rongeurs.   The fusion surfaces were then decorticated and rods were bent bilaterally to span from C2 to T2. The rods were placed, caps inserted, and then final tightened to manufacturer specifications. Allograft bone was placed (Magnafuse, Medtronic) along the fusion surfaces.  All instrument and sponge counts were correct, hemostasis was confirmed the incision was then closed in layers. The patient was then returned to anesthesia for emergence. No apparent complications at the completion of the procedure.   EBL:  721mL   DRAINS: none   SPECIMENS: none   Judith Part, MD 10/10/19 12:54 PM

## 2019-10-10 NOTE — Brief Op Note (Signed)
10/10/2019  7:11 PM  PATIENT:  [name].  63 y.o. adult  PRE-OPERATIVE DIAGNOSIS:  Pseudoarthrosis of cervical spine  POST-OPERATIVE DIAGNOSIS:  Pseudoarthrosis of cervical spine  PROCEDURE:  Procedure(s) with comments: Cervical Two to Thoracic Two Posterior Cervical Instrumented Fusion (N/A) - Cervical 2 to Thoracic 2 Posterior cervical instrumented fusion APPLICATION OF INTRAOPERATIVE CT SCAN (N/A)  SURGEON:  Surgeon(s) and Role:    * Judith Part, MD - Primary  PHYSICIAN ASSISTANT:   ASSISTANTS: Erline Levine   ANESTHESIA:   general  EBL:  1000 mL   BLOOD ADMINISTERED:none  DRAINS: none   LOCAL MEDICATIONS USED:  LIDOCAINE   SPECIMEN:  No Specimen  DISPOSITION OF SPECIMEN:  N/A  COUNTS:  YES  TOURNIQUET:  * No tourniquets in log *  DICTATION: .Note written in EPIC  PLAN OF CARE: Admit to inpatient   PATIENT DISPOSITION:  PACU - hemodynamically stable.   Delay start of Pharmacological VTE agent (>24hrs) due to surgical blood loss or risk of bleeding: yes

## 2019-10-10 NOTE — Anesthesia Procedure Notes (Signed)
Arterial Line Insertion Start/End12/30/2020 10:50 AM, 10/10/2019 11:00 AM Performed by: Griffin Dakin, CRNA, CRNA  Patient location: Pre-op. Preanesthetic checklist: patient identified, IV checked, site marked, risks and benefits discussed, surgical consent, monitors and equipment checked, pre-op evaluation, timeout performed and anesthesia consent Lidocaine 1% used for infiltration Left, radial was placed Catheter size: 20 Fr Hand hygiene performed  and maximum sterile barriers used   Attempts: 1 Procedure performed without using ultrasound guided technique. Following insertion, dressing applied. Post procedure assessment: normal and unchanged  Patient tolerated the procedure well with no immediate complications.

## 2019-10-10 NOTE — H&P (Signed)
Surgical H&P Update  HPI: 63 y.o. man with history of neck pain and bilateral hand numbness, here for revision of his posterior construct and decompression. He previously had an ACDF then required posterior instrumentation, but has unfortunately developed pseudoarthroses. No changes in health since she was last seen. Still having severe neck pain with hand numbness and wishes to proceed with surgery.  PMHx:  Past Medical History:  Diagnosis Date  . Anginal pain ([place])   . Arthritis    "left shoulder; back" (08/14/2018)  . Bradycardia by electrocardiogram 2006   Medtronic PPM  . CAD (coronary artery disease) of artery bypass graft, with stent to VG-OM-(B[facility]) 2017   a. 06/29/16 PCI -DES x2 to SVG-RPDA (ostial SVG-RCA was for ISR), SVG-OM1 stent 100%. Patent LIMA-LAD & SVG-RI .  Native Cx - extensive stents with occluded OM1 and OM 2. 10/2017:  pLAD 100% (patent LIMA - dLAD 70%). NEW: p-mCx 100% @ prior stent (now Cx & OM1/OM2 occluded - fill via collatrals from RI). SVG-rPDA  80% ISR (PTCA), anastomotic SVG-RI 80% with 80% into Diag branch of RI (DES PCI)  . CAD (coronary artery disease), with CABG LIMA-LAD, VG-ramus intermediate, SVG-OM, SVG-PDA 2002   Cath September 2017: patent LIMA-LAD, patent SVG-RI, occluded SVG-OM1 with patent stents in LCx and RI.  Severe ISR in SVG-RCA with severe mid disease.  Occluded OM1, OM 2, mid LAD and mid RCA.  Distal LAD 70%, distal LCx --2 overlapping DES stents to SVG-RCA;; 11/10/17: ISR of SVG-RCA  (~70%). 80% distal SVG-RI with RI lesion. CTO of LCx & OM stents -> PCI SVG-RI into RI & PTCA of SVG-RCA  . CHF (congestive heart failure) ([place])   . Chronic lower back pain    . CKD (chronic kidney disease)    "left kidney atrophied" (08/14/2018)  . Depression   . Dyslipidemia, with intolerance to Crestor and zocor and possible cough with Lipitor    . History of blood transfusion 04/2001   "when I had bypass OR"  . History of kidney stones    "I've had several stones"  (08/14/2018)  . HTN (hypertension)    . Myocardial infarction [facility])    hx 6 heart attacks; 2 were in April 2014 and then September 2017 (08/14/2018)  . Obesity (BMI 30-39.9)   . OSA on CPAP    . Presence of permanent cardiac pacemaker 2003   placed ~ 2003; removed ~ 2017  . Shortness of breath dyspnea    unable to climb a set of stairs  . Tremor, essential, treated with propranolol     FamHx:  Family History  Problem Relation Age of Onset  . Heart disease Father   . Hyperlipidemia Father   . Hypertension Father   . Diabetes Father   . Heart murmur Mother    SocHx:  reports that he has never smoked. He has never used smokeless tobacco. He reports previous alcohol use. He reports previous drug use.  Physical Exam: AOx3, PERRL, FS, TM  Strength 5/5 x4, SILT except for diffuse numbness across both hands  Assesment/Plan: 63 y.o. man with history of cervical stenosis and pseudoarthrosis, here for revision / extension of instrumented fusion from C2-T2 as well as lamis at C4 and C5 with C7-T1 bilateral foraminotomies with use of intraop-CT. Risks, benefits, and alternatives discussed and the patient would like to continue with surgery.  -OR today -3C post-op  Judith Part, MD 10/10/19 12:51 PM

## 2019-10-10 NOTE — Transfer of Care (Addendum)
Immediate Anesthesia Transfer of Care Note  Patient: [name].  Procedure(s) Performed: Cervical Two to Thoracic Two Posterior Cervical Instrumented Fusion (N/A Spine Cervical) APPLICATION OF INTRAOPERATIVE CT SCAN (N/A )  Patient Location: PACU  Anesthesia Type:General  Level of Consciousness: drowsy  Airway & Oxygen Therapy: Patient Spontanous Breathing and Patient connected to face mask oxygen  Post-op Assessment: Report given to RN and Post -op Vital signs reviewed and stable  SBP 190s; dr Ermalene Postin notified  Post vital signs: Reviewed and stable  Last Vitals:  Vitals Value Taken Time  BP 183/102 10/10/19 1921  Temp    Pulse 81 10/10/19 1924  Resp 13 10/10/19 1924  SpO2 99 % 10/10/19 1924  Vitals shown include unvalidated device data.  Last Pain:  Vitals:   10/10/19 1051  TempSrc:   PainSc: 2          Complications: No apparent anesthesia complications

## 2019-10-11 ENCOUNTER — Inpatient Hospital Stay (HOSPITAL_COMMUNITY): Payer: Medicare Other

## 2019-10-11 LAB — CBC
HCT: 39.4 % (ref 39.0–52.0)
Hemoglobin: 13.3 g/dL (ref 13.0–17.0)
MCH: 28.2 pg (ref 26.0–34.0)
MCHC: 33.8 g/dL (ref 30.0–36.0)
MCV: 83.7 fL (ref 80.0–100.0)
Platelets: 301 10*3/uL (ref 150–400)
RBC: 4.71 MIL/uL (ref 4.22–5.81)
RDW: 14 % (ref 11.5–15.5)
WBC: 13.1 10*3/uL — ABNORMAL HIGH (ref 4.0–10.5)
nRBC: 0 % (ref 0.0–0.2)

## 2019-10-11 MED ORDER — DOCUSATE SODIUM 100 MG PO CAPS: 100.0000 mg | ORAL_CAPSULE | Freq: Two times a day (BID) | ORAL | 0 refills | Status: AC

## 2019-10-11 MED ORDER — OXYCODONE HCL 10 MG PO TABS: 10.0000 mg | ORAL_TABLET | ORAL | 0 refills | Status: AC | PRN

## 2019-10-11 NOTE — Evaluation (Addendum)
Occupational Therapy Evaluation Patient Details Name: [name]. MRN: [id number] DOB: Apr 10, 1956 Today's Date: 10/11/2019    History of Present Illness Pt is a 63 y.o. M with significant PMH of CAD, CHF, CKD, prior ACDF, who presents s/p revision and extension of posterior cervical instrumented fusion from C2-T2, C4 and C5 laminectomies, bilateral C7-T1 foraminotomies.   Clinical Impression   PTA pt independent, enjoys wood working. Stated that toilet and shower transfers were beginning to get more worrisome prior to sx. At time of eval, pt is supervision for transfers and functional mobility. Reviewed cervical precautions sheet and implications with BADL. Educated pt on use of 3:1 as shower seat and toilet riser, as well as more permanent options to increase safety and independence with bathing and toileting. Pt is in understanding of BADL/IADL safety and precautions upon returning home. Wife present for session and education and is also in understanding. No further OT needs identified, OT will sign off. No need for f/u OT at this time. Thank you for this consult.    Follow Up Recommendations  Home health OT;Supervision - Intermittent    Equipment Recommendations  3 in 1 bedside commode    Recommendations for Other Services       Precautions / Restrictions Precautions Precautions: Fall;Cervical Precaution Booklet Issued: Yes (comment) Precaution Comments: verbally reviewed; provided written handout Restrictions Weight Bearing Restrictions: No      Mobility Bed Mobility Overal bed mobility: Modified Independent             General bed mobility comments: sitting EOB , finishing with PT  Transfers Overall transfer level: Needs assistance Equipment used: Rolling walker (2 wheeled) Transfers: Sit to/from Stand Sit to Stand: Supervision              Balance Overall balance assessment: Needs assistance Sitting-balance support: Feet supported Sitting  balance-Leahy Scale: Good     Standing balance support: No upper extremity supported;During functional activity Standing balance-Leahy Scale: Fair                             ADL either performed or assessed with clinical judgement   ADL Overall ADL's : Needs assistance/impaired Eating/Feeding: Set up;Sitting   Grooming: Set up;Sitting;Standing   Upper Body Bathing: Set up;Sitting   Lower Body Bathing: Sit to/from stand;Min guard;Adhering to back precautions   Upper Body Dressing : Set up;Sitting   Lower Body Dressing: Set up;Sit to/from stand;Sitting/lateral leans;Adhering to back precautions   Toilet Transfer: Set up;Ambulation;RW   Toileting- Water quality scientist and Hygiene: Min guard;Sit to/from stand;Adhering to back precautions   Tub/ Shower Transfer: Min guard;3 in 1;Ambulation;Rolling walker;Adhering to back precautions   Functional mobility during ADLs: Min guard;Rolling walker General ADL Comments: pt mostly limited by post sx pain and precautions     Vision Patient Visual Report: No change from baseline       Perception     Praxis      Pertinent Vitals/Pain Pain Assessment: Faces Faces Pain Scale: Hurts a little bit Pain Location: surgical site Pain Descriptors / Indicators: Operative site guarding Pain Intervention(s): Monitored during session     Hand Dominance Right   Extremity/Trunk Assessment Upper Extremity Assessment Upper Extremity Assessment: Overall WFL for tasks assessed(reports tingling and weakness prior to sx that has not resolved. No strength or FMC deficits noted on evaluation)   Lower Extremity Assessment Lower Extremity Assessment: Defer to PT evaluation RLE Deficits / Details: Strength 5/5 LLE  Deficits / Details: Strength 5/5   Cervical / Trunk Assessment Cervical / Trunk Assessment: Other exceptions Cervical / Trunk Exceptions: s/p C2-T2 fusion   Communication Communication Communication: No difficulties    Cognition Arousal/Alertness: Awake/alert Behavior During Therapy: WFL for tasks assessed/performed Overall Cognitive Status: Within Functional Limits for tasks assessed                                     General Comments       Exercises     Shoulder Instructions      Home Living Family/patient expects to be discharged to:: Private residence Living Arrangements: Spouse/significant other Available Help at Discharge: Family Type of Home: House Home Access: Stairs to enter Technical brewer of Steps: 3   [place]: One level     Bathroom Shower/Tub: Teacher, early years/pre: Handicapped height Bathroom Accessibility: No(can fit BSC Over toilet but space tight for RW)   Home Equipment: None   Additional Comments: Pt living in a rental house currently      Prior Functioning/Environment Level of Independence: Independent        Comments: enjoys wood working with bird houses        OT Problem List: Decreased knowledge of use of DME or AE;Decreased knowledge of precautions;Impaired balance (sitting and/or standing);Decreased activity tolerance;Pain      OT Treatment/Interventions: Self-care/ADL training;Therapeutic exercise;Patient/family education;Balance training;Therapeutic activities;DME and/or AE instruction    OT Goals(Current goals can be found in the care plan section) Acute Rehab OT Goals 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 OT Goal Formulation: With patient Time For Goal Achievement: 10/25/19 Potential to Achieve Goals: Good  OT Frequency:    Barriers to D/C:            Co-evaluation              AM-PAC OT "6 Clicks" Daily Activity     Outcome Measure Help from another person eating meals?: A Little Help from another person taking care of personal grooming?: A Little Help from another person toileting, which includes using toliet, bedpan, or urinal?: A Little Help from another person bathing  (including washing, rinsing, drying)?: A Little Help from another person to put on and taking off regular upper body clothing?: A Little Help from another person to put on and taking off regular lower body clothing?: A Little 6 Click Score: 15   End of Session Equipment Utilized During Treatment: Gait belt;Rolling walker Nurse Communication: Mobility status;Precautions  Activity Tolerance: Patient tolerated treatment well Patient left: in bed;with call bell/phone within reach;with family/visitor present  OT Visit Diagnosis: Unsteadiness on feet (R26.81);Other abnormalities of gait and mobility (R26.89);Pain Pain - part of body: (cervical)                Time: [id number] OT Time Calculation (min): 26 min Charges:  OT General Charges $OT Visit: 1 Visit OT Evaluation $OT Eval Low Complexity: 1 Low OT Treatments $Self Care/Home Management : 8-22 mins  Zenovia Jarred, MSOT, OTR/L Behavioral Health OT/ Acute Relief OT [facility] Office: [phone]  Zenovia Jarred 10/11/2019, 2:26 PM

## 2019-10-11 NOTE — Progress Notes (Signed)
Neurosurgery Service Progress Note  Subjective: No acute events overnight, hands feel remarkably better post-op, neck pain improving   Objective: Vitals:   10/10/19 1920 10/10/19 2005 10/10/19 2048 10/11/19 0838  BP:  (!) 161/92 (!) 169/81 118/70  Pulse:  86 89 89  Resp:  17 17 18   Temp: 98.8 F (37.1 C) 97.9 F (36.6 C) 97.9 F (36.6 C) 98.2 F (36.8 C)  TempSrc:   Oral Oral  SpO2:  99% 98% 97%  Weight:      Height:       Temp (24hrs), Avg:98.2 F (36.8 C), Min:97.9 F (36.6 C), Max:98.8 F (37.1 C)  CBC Latest Ref Rng & Units 10/11/2019 10/10/2019 10/08/2019  WBC 4.0 - 10.5 K/uL 13.1(H) - 8.5  Hemoglobin 13.0 - 17.0 g/dL 13.3 13.9 17.4(H)  Hematocrit 39.0 - 52.0 % 39.4 41.0 52.5(H)  Platelets 150 - 400 K/uL 301 - 278   BMP Latest Ref Rng & Units 10/10/2019 10/08/2019 07/02/2019  Glucose 70 - 99 mg/dL - 114(H) 148(H)  BUN 8 - 23 mg/dL - 17 15  Creatinine 0.61 - 1.24 mg/dL - 1.73(H) 1.53(H)  BUN/Creat Ratio 10 - 24 - - 10  Sodium 135 - 145 mmol/L 137 137 139  Potassium 3.5 - 5.1 mmol/L 5.7(H) 4.2 4.8  Chloride 98 - 111 mmol/L - 104 101  CO2 22 - 32 mmol/L - 22 22  Calcium 8.9 - 10.3 mg/dL - 9.9 9.6    Intake/Output Summary (Last 24 hours) at 10/11/2019 0844 Last data filed at 10/11/2019 0630 Gross per 24 hour  Intake 2606.87 ml  Output 3580 ml  Net -973.13 ml    Current Facility-Administered Medications:  .  0.9 %  sodium chloride infusion, 250 mL, Intravenous, Continuous, Felipa Laroche A, MD, Last Rate: 1 mL/hr at 10/10/19 2252, 250 mL at 10/10/19 2252 .  acetaminophen (TYLENOL) tablet 650 mg, 650 mg, Oral, Q4H PRN **OR** acetaminophen (TYLENOL) suppository 650 mg, 650 mg, Rectal, Q4H PRN, Judith Part, MD .  allopurinol (ZYLOPRIM) tablet 300 mg, 300 mg, Oral, Daily, Judith Part, MD, 300 mg at 10/10/19 2117 .  baclofen (LIORESAL) tablet 10 mg, 10 mg, Oral, QHS, Judith Part, MD, 10 mg at 10/10/19 2117 .  buPROPion [facility] SR) 12  hr tablet 150 mg, 150 mg, Oral, BID, Judith Part, MD, 150 mg at 10/10/19 2119 .  clonazePAM (KLONOPIN) tablet 2 mg, 2 mg, Oral, QHS, Judith Part, MD, 2 mg at 10/10/19 2118 .  cyclobenzaprine (FLEXERIL) tablet 10 mg, 10 mg, Oral, TID PRN, Judith Part, MD .  diltiazem (CARDIZEM CD) 24 hr capsule 240 mg, 240 mg, Oral, Daily, Judith Part, MD, 240 mg at 10/10/19 2118 .  docusate sodium (COLACE) capsule 100 mg, 100 mg, Oral, BID, Judith Part, MD, 100 mg at 10/10/19 2119 .  furosemide (LASIX) tablet 40 mg, 40 mg, Oral, Daily, Judith Part, MD, 40 mg at 10/10/19 2119 .  [START ON 10/12/2019] heparin injection 5,000 Units, 5,000 Units, Subcutaneous, Q8H, Aarilyn Dye A, MD .  HYDROmorphone (DILAUDID) injection 1 mg, 1 mg, Intravenous, Q3H PRN, Judith Part, MD, 1 mg at 10/11/19 0815 .  menthol-cetylpyridinium (CEPACOL) lozenge 3 mg, 1 lozenge, Oral, PRN **OR** phenol (CHLORASEPTIC) mouth spray 1 spray, 1 spray, Mouth/Throat, PRN, Oliver Neuwirth A, MD .  nitroGLYCERIN (NITROSTAT) SL tablet 0.4 mg, 0.4 mg, Sublingual, Q5 min PRN, Mung Rinker A, MD .  ondansetron (ZOFRAN) tablet 4 mg, 4 mg, Oral, Q6H  PRN **OR** ondansetron (ZOFRAN) injection 4 mg, 4 mg, Intravenous, Q6H PRN, Oral Remache A, MD .  oxyCODONE (Oxy IR/ROXICODONE) immediate release tablet 10 mg, 10 mg, Oral, Q4H PRN, Judith Part, MD .  oxyCODONE (Oxy IR/ROXICODONE) immediate release tablet 5 mg, 5 mg, Oral, Q4H PRN, Judith Part, MD, 5 mg at 10/10/19 2351 .  polyethylene glycol (MIRALAX / GLYCOLAX) packet 17 g, 17 g, Oral, Daily PRN, Judith Part, MD .  propranolol (INDERAL) tablet 20 mg, 20 mg, Oral, TID, Judith Part, MD, 20 mg at 10/10/19 2249 .  ranolazine (RANEXA) 12 hr tablet 500 mg, 500 mg, Oral, BID, Judith Part, MD, 500 mg at 10/10/19 2119 .  sodium chloride flush (NS) 0.9 % injection 3 mL, 3 mL, Intravenous, Q12H, Judith Part,  MD, 3 mL at 10/10/19 2137 .  sodium chloride flush (NS) 0.9 % injection 3 mL, 3 mL, Intravenous, PRN, Judith Part, MD   Physical Exam: AOx3, PERRL, EOMI, FS, Strength 5/5 x4, SILTx4 Incision & pin sites c/d/i  Assessment & Plan: 63 y.o. man s/p revision C2-T2 posterior instrumentation/fusion, recovering well.  -foley out -standing baseline AP/lateral xrays today -PT/OT -activity as tolerated, no brace needed -cont home meds -SCDs/TEDs, start SQH POD2 -discharge home tomorrow  Judith Part  10/11/19 8:44 AM

## 2019-10-11 NOTE — Discharge Summary (Signed)
Discharge Summary  Date of Admission: 10/10/2019  Date of Discharge: 10/11/19  Attending Physician: Emelda Brothers, MD  Hospital Course: Patient was admitted following an uncomplicated revision and extension of his posterior cervical instrumented fusion with decompression. He was recovered in PACU and transferred to a regular nursing floor. His hospital course was uncomplicated and the patient was discharged home on 10/12/2019. He will follow up in clinic with me in 2 weeks.  Neurologic exam at discharge:  AOx3, PERRL, EOMI, FS, TM Strength 5/5 x4, SILTx4, incision c/d/i  Discharge diagnosis: Cervical myelopathy, cervical pseudoarthrosis  Judith Part, MD 10/11/19 10:13 AM

## 2019-10-11 NOTE — TOC Initial Note (Addendum)
Transition of Care (TOC) - Initial/Assessment Note    Patient Details  Name: [name]. MRN: [id number] Date of Birth: [birth year]  Transition of Care [facility]) CM/SW Contact:    Pollie Friar, RN Phone Number: 10/11/2019, 12:08 PM  Clinical Narrative:                 Patient is from home with spouse that can provide 24 hour supervision and assistance.  Pt denies any transportation issues.  Awaiting PT/OT evals.  TOC following for d/c needs.  Addendum 1622: DME ordered to be delivered to the room.  Expected Discharge Plan: Home/Self Care Barriers to Discharge: Continued Medical Work up   Patient Goals and CMS Choice        Expected Discharge Plan and Services Expected Discharge Plan: Home/Self Care   Discharge Planning Services: CM Consult   Living arrangements for the past 2 months: Single Family Home                                      Prior Living Arrangements/Services Living arrangements for the past 2 months: Single Family Home Lives with:: Spouse Patient language and need for interpreter reviewed:: Yes Do you feel safe going back to the place where you live?: Yes      Need for Family Participation in Patient Care: Yes (Comment) Care giver support system in place?: Yes (comment) Current home services: DME(CPAP) Criminal Activity/Legal Involvement Pertinent to Current Situation/Hospitalization: No - Comment as needed  Activities of Daily Living      Permission Sought/Granted                  Emotional Assessment Appearance:: Appears stated age Attitude/Demeanor/Rapport: Engaged Affect (typically observed): Accepting, Pleasant Orientation: : Oriented to Self, Oriented to Place, Oriented to Situation, Oriented to  Time   Psych Involvement: No (comment)  Admission diagnosis:  Pseudoarthrosis of cervical spine ([place]) [S12.9XXA] Patient Active Problem List   Diagnosis Date Noted  . Pseudoarthrosis of cervical spine ([place]) 10/10/2019   . Chest pain with moderate risk of acute coronary syndrome 03/08/2019  . Chronic combined systolic and diastolic CHF (congestive heart failure) ([place]) 08/14/2018  . CKD (chronic kidney disease) stage 4, GFR 15-29 ml/min ([facility]) 07/05/2018  . Right nephrolithiasis 07/05/2018  . Hydroureter on right 07/05/2018  . Preop cardiovascular exam 11/09/2017  . Pacemaker at end of battery life 01/26/2016  . Uncontrolled hypertension 10/24/2015  . Morbid obesity - BMI 39 with multple CRFs. 10/24/2015  . Shingles 11/11/2013  . DOE (dyspnea on exertion) 09/21/2013  . NSTEMI (non-ST elevated myocardial infarction), 01/17/13 PCI LCX AV Groove with Xience DES. and angiosculpt. unable to cross occluded OM.  EF 40 % 11/28/2012  . CAD S/P percutaneous coronary angioplasty 11/28/2012  . OSA on CPAP 11/28/2012  . 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 Tremor, essential, treated with propranolol 11/28/2012  . Essential hypertension 11/28/2012  . Dyslipidemia, goal LDL below 70 -on [place] 11/28/2012  . Back pain, chronic 11/28/2012  . CAD-CABG 2002 (LIMA-LAD, VG-RI, SVG-OM-multiple PCIs since 11/28/2012   PCP:  Meryle Ready, MD Pharmacy:   [id number] - [place], [address] [place] [address] [address] Phone: [phone] Fax: [phone]     Social Determinants of Health (SDOH) Interventions    Readmission Risk Interventions No flowsheet data found.

## 2019-10-11 NOTE — Progress Notes (Signed)
Foley D/C. Ambulated in the hallway. Tolerated well and pain under control.

## 2019-10-11 NOTE — Anesthesia Postprocedure Evaluation (Signed)
Anesthesia Post Note  Patient: Lennyn Vasbinder.  Procedure(s) Performed: Cervical Two to Thoracic Two Posterior Cervical Instrumented Fusion (N/A Spine Cervical) APPLICATION OF INTRAOPERATIVE CT SCAN (N/A )     Patient location during evaluation: PACU Anesthesia Type: General Level of consciousness: awake and alert Pain management: pain level controlled Vital Signs Assessment: post-procedure vital signs reviewed and stable Respiratory status: spontaneous breathing, nonlabored ventilation, respiratory function stable and patient connected to nasal cannula oxygen Cardiovascular status: blood pressure returned to baseline and stable Postop Assessment: no apparent nausea or vomiting Anesthetic complications: no    Last Vitals:  Vitals:   10/10/19 2005 10/10/19 2048  BP: (!) 161/92 (!) 169/81  Pulse: 86 89  Resp: 17 17  Temp: 36.6 C 36.6 C  SpO2: 99% 98%    Last Pain:  Vitals:   10/11/19 0413  TempSrc:   PainSc: 4                  Joe Tanney

## 2019-10-11 NOTE — Progress Notes (Signed)
Due to patients body habitus he requires bariatric rolling walker and 3 in 1. He would not be able to successfully use a regular size walker or 3 in 1.

## 2019-10-11 NOTE — Discharge Instructions (Signed)
Discharge Instructions  No restriction in activities except no lifting greater than 20 pounds. Slowly increase your activity back to normal.   Your incision is closed with dermabond (purple glue). This will naturally fall off over the next 1-2 weeks.   Okay to shower on the day of discharge. Use regular soap and water and try to be gentle when cleaning your incision.   Follow up with Dr. Zada Finders in 2 weeks after discharge. If you do not already have a discharge appointment, please call his office at [phone] to schedule a follow up appointment. If you have any concerns or questions, please call the office and let us know.

## 2019-10-11 NOTE — Progress Notes (Signed)
Arrived from PACU at 2050. Alert and oriented. C/O of surgical pain 11/10 Dilaudid administered.  Dressing CDI.

## 2019-10-11 NOTE — Evaluation (Addendum)
Physical Therapy Evaluation Patient Details Name: [name]. MRN: [id number] DOB: [birth year] Today's Date: 10/11/2019   History of Present Illness  Pt is a 63 y.o. M with significant PMH of CAD, CHF, CKD, prior ACDF, who presents s/p revision and extension of posterior cervical instrumented fusion from C2-T2, C4 and C5 laminectomies, bilateral C7-T1 foraminotomies.  Clinical Impression  Pt admitted s/p procedure listed above. Pt reporting fairly good pain control (premedicated prior to session) and no hand numbness. Ambulating 200 feet with walker at a supervision level; negotiated 4 steps with instruction for technique to prepare for discharge home. Pt presents with decreased functional mobility secondary to pain, decreased cervical and shoulder ROM, and upper extremity weakness. Education provided regarding cervical precautions, cryotherapy, and activity recommendations/progression. Would benefit from a walker for pain control and negotiating unlevel surfaces.     Follow Up Recommendations No PT follow up (would benefit from OPPT when appropriate per surgeon)    Equipment Recommendations  Rolling walker with 5" wheels;3in1 (PT) (bariatric)   Recommendations for Other Services       Precautions / Restrictions Precautions Precautions: Fall;Cervical Precaution Booklet Issued: Yes (comment) Precaution Comments: verbally reviewed; provided written handout Restrictions Weight Bearing Restrictions: No      Mobility  Bed Mobility Overal bed mobility: Modified Independent             General bed mobility comments: Good log roll technique, use of bed rail  Transfers Overall transfer level: Needs assistance Equipment used: Rolling walker (2 wheeled) Transfers: Sit to/from Stand Sit to Stand: Supervision            Ambulation/Gait Ambulation/Gait assistance: Supervision Gait Distance (Feet): 200 Feet Assistive device: Rolling walker (2 wheeled) Gait  Pattern/deviations: Step-through pattern;Decreased stride length Gait velocity: decreased   General Gait Details: Cues for proper use of walker i.e. keeping all 4 points on ground  Stairs Stairs: Yes Stairs assistance: Min guard Stair Management: One rail Right Number of Stairs: 4 General stair comments: Cues for step by step pattern  Wheelchair Mobility    Modified Rankin (Stroke Patients Only)       Balance Overall balance assessment: Needs assistance Sitting-balance support: Feet supported Sitting balance-Leahy Scale: Good     Standing balance support: No upper extremity supported;During functional activity Standing balance-Leahy Scale: Fair                               Pertinent Vitals/Pain Pain Assessment: Faces Faces Pain Scale: Hurts a little bit Pain Location: surgical site Pain Descriptors / Indicators: Operative site guarding Pain Intervention(s): Monitored during session;Premedicated before session    Home Living Family/patient expects to be discharged to:: Private residence Living Arrangements: Spouse/significant other Available Help at Discharge: Family Type of Home: House Home Access: Stairs to enter   Technical brewer of Steps: 3 Home Layout: One level   Additional Comments: Pt living in a rental house currently    Prior Function Level of Independence: Independent         Comments: limited community ambulator without AD     Hand Dominance        Extremity/Trunk Assessment   Upper Extremity Assessment Upper Extremity Assessment: Defer to OT evaluation    Lower Extremity Assessment Lower Extremity Assessment: RLE deficits/detail;LLE deficits/detail RLE Deficits / Details: Strength 5/5 LLE Deficits / Details: Strength 5/5    Cervical / Trunk Assessment Cervical / Trunk Assessment: Other exceptions Cervical / Trunk  Exceptions: s/p C2-T2 fusion  Communication   Communication: No difficulties  Cognition  Arousal/Alertness: Awake/alert Behavior During Therapy: WFL for tasks assessed/performed Overall Cognitive Status: Within Functional Limits for tasks assessed                                        General Comments      Exercises     Assessment/Plan    PT Assessment Patient needs continued PT services  PT Problem List Decreased strength;Decreased range of motion;Decreased activity tolerance;Decreased balance;Decreased mobility;Pain       PT Treatment Interventions DME instruction;Gait training;Stair training;Functional mobility training;Therapeutic activities;Therapeutic exercise;Balance training;Patient/family education    PT Goals (Current goals can be found in the Care Plan section)  Acute Rehab PT Goals Patient Stated Goal: "walk around my pond." PT Goal Formulation: With patient Time For Goal Achievement: 10/16/19 Potential to Achieve Goals: Good    Frequency Min 5X/week   Barriers to discharge        Co-evaluation               AM-PAC PT "6 Clicks" Mobility  Outcome Measure Help needed turning from your back to your side while in a flat bed without using bedrails?: None Help needed moving from lying on your back to sitting on the side of a flat bed without using bedrails?: None Help needed moving to and from a bed to a chair (including a wheelchair)?: None Help needed standing up from a chair using your arms (e.g., wheelchair or bedside chair)?: None Help needed to walk in hospital room?: None Help needed climbing 3-5 steps with a railing? : A Little 6 Click Score: 23    End of Session   Activity Tolerance: Patient tolerated treatment well Patient left: in bed;with call bell/phone within reach;with family/visitor present Nurse Communication: Mobility status PT Visit Diagnosis: Difficulty in walking, not elsewhere classified (R26.2);Pain Pain - part of body: (neck)    Time: [id number] PT Time Calculation (min) (ACUTE ONLY): 24  min   Charges:   PT Evaluation $PT Eval Moderate Complexity: 1 Mod PT Treatments $Gait Training: 8-22 mins        Ellamae Sia, PT, DPT Acute Rehabilitation Services Pager [phone] Office [phone]   Willy Eddy 10/11/2019, 1:00 PM

## 2019-10-12 MED ORDER — CHLORPROMAZINE HCL 25 MG PO TABS: 25.0000 mg | ORAL_TABLET | Freq: Four times a day (QID) | ORAL | 0 refills | Status: AC | PRN

## 2019-10-12 NOTE — Plan of Care (Signed)
  Problem: Education: Goal: Knowledge of General Education information will improve Description: Including pain rating scale, medication(s)/side effects and non-pharmacologic comfort measures Outcome: Completed/Met   Problem: Health Behavior/Discharge Planning: Goal: Ability to manage health-related needs will improve Outcome: Completed/Met   Problem: Clinical Measurements: Goal: Ability to maintain clinical measurements within normal limits will improve Outcome: Completed/Met Goal: Will remain free from infection Outcome: Completed/Met Goal: Diagnostic test results will improve Outcome: Completed/Met Goal: Respiratory complications will improve Outcome: Completed/Met Goal: Cardiovascular complication will be avoided Outcome: Completed/Met   Problem: Activity: Goal: Risk for activity intolerance will decrease Outcome: Completed/Met   Problem: Nutrition: Goal: Adequate nutrition will be maintained Outcome: Completed/Met   Problem: Coping: Goal: Level of anxiety will decrease Outcome: Completed/Met   Problem: Elimination: Goal: Will not experience complications related to bowel motility Outcome: Completed/Met Goal: Will not experience complications related to urinary retention Outcome: Completed/Met   Problem: Pain Managment: Goal: General experience of comfort will improve Outcome: Completed/Met   Problem: Safety: Goal: Ability to remain free from injury will improve Outcome: Completed/Met   Problem: Skin Integrity: Goal: Risk for impaired skin integrity will decrease Outcome: Completed/Met   Problem: Activity: Goal: Ability to avoid complications of mobility impairment will improve Outcome: Completed/Met Goal: Ability to tolerate increased activity will improve Outcome: Completed/Met Goal: Will remain free from falls Outcome: Completed/Met   Problem: Bowel/Gastric: Goal: Gastrointestinal status for postoperative course will improve Outcome: Completed/Met   Problem: Clinical Measurements: Goal: Ability to maintain clinical measurements within normal limits will improve Outcome: Completed/Met Goal: Postoperative complications will be avoided or minimized Outcome: Completed/Met Goal: Diagnostic test results will improve Outcome: Completed/Met   Problem: Pain Management: Goal: Pain level will decrease Outcome: Completed/Met   Problem: Skin Integrity: Goal: Will show signs of wound healing Outcome: Completed/Met   Problem: Health Behavior/Discharge Planning: Goal: Identification of resources available to assist in meeting health care needs will improve Outcome: Completed/Met   Problem: Bladder/Genitourinary: Goal: Urinary functional status for postoperative course will improve Outcome: Completed/Met

## 2019-10-12 NOTE — TOC Transition Note (Signed)
Transition of Care [facility]) - CM/SW Discharge Note   Patient Details  Name: [name]. MRN: [id number] Date of Birth: Jan 17, 1956  [facility] CM/SW Contact:  Geralynn Ochs, LCSW Phone Number: 10/12/2019, 12:38 PM   Clinical Narrative:   CSW met with patient to discuss home health OT recommendation. Patient not interested, says he doesn't feel he needs it. CSW explained to patient to reach out to PCP if he changes his mind upon his return home. No other needs at this time.    Final next level of care: Home/Self Care Barriers to Discharge: Barriers Resolved   Patient Goals and CMS Choice        Discharge Placement                       Discharge Plan and Services   Discharge Planning Services: CM Consult                                 Social Determinants of Health (SDOH) Interventions     Readmission Risk Interventions No flowsheet data found.

## 2019-10-12 NOTE — Progress Notes (Signed)
Patient is discharged home with family, discharge instructions given to patient. Teaching done on new medications, dressing change done and dressing supplies given to patient. Patient/family verbalized understanding.

## 2019-10-15 ENCOUNTER — Encounter: Payer: Self-pay | Admitting: *Deleted

## 2019-10-16 ENCOUNTER — Encounter: Payer: Self-pay | Admitting: *Deleted

## 2019-10-19 ENCOUNTER — Other Ambulatory Visit: Payer: Self-pay | Admitting: Cardiology

## 2019-11-14 ENCOUNTER — Other Ambulatory Visit: Payer: Self-pay | Admitting: Cardiology

## 2020-01-01 ENCOUNTER — Telehealth: Payer: Self-pay | Admitting: Cardiology

## 2020-01-01 NOTE — Telephone Encounter (Signed)
[name] the patient's wife is calling to inform Dr. Lorre Nick passed away on January 06, 2020. [name] would like [name] to call her in regards to this. Please advise.

## 2020-01-01 NOTE — Telephone Encounter (Signed)
Left message to say very sorry for her loss-  will call  Back  Later to talk with wife.

## 2020-01-01 NOTE — Telephone Encounter (Signed)
SPoke to wife -    CONDOLENCE  GIVEN TO HER. [place] ANF RN TO KNOW ABOUT Mr Bourne.   "SHE SAID  THAT THE PATIENT SPOKE VERY HIGHLY OF   THE TWO OF YOU " " I APPRECIATE ALL THAT DR HARDING DID FOR [name]  KEEPING ALIVE FOR SO LONG"   RN INFORMED Mrs Ruffins again our sympathy for her loss.

## 2020-01-01 NOTE — Telephone Encounter (Signed)
Oh my - that was unexpected.    So sad to hear that.  [name], MD

## 2020-01-10 DEATH — deceased
